# Patient Record
Sex: Female | Born: 1950 | Race: White | Hispanic: No | Marital: Married | Smoking: Former smoker
Health system: Southern US, Community
[De-identification: ages and names within clinical notes are randomized; demographics above are authoritative.]

## PROBLEM LIST (undated history)

## (undated) DIAGNOSIS — C169 Malignant neoplasm of stomach, unspecified: Secondary | ICD-10-CM

## (undated) DIAGNOSIS — G4733 Obstructive sleep apnea (adult) (pediatric): Secondary | ICD-10-CM

## (undated) DIAGNOSIS — R112 Nausea with vomiting, unspecified: Secondary | ICD-10-CM

## (undated) DIAGNOSIS — I10 Essential (primary) hypertension: Secondary | ICD-10-CM

## (undated) DIAGNOSIS — Z862 Personal history of diseases of the blood and blood-forming organs and certain disorders involving the immune mechanism: Secondary | ICD-10-CM

## (undated) DIAGNOSIS — E039 Hypothyroidism, unspecified: Secondary | ICD-10-CM

## (undated) DIAGNOSIS — E288 Other ovarian dysfunction: Secondary | ICD-10-CM

## (undated) DIAGNOSIS — Z8639 Personal history of other endocrine, nutritional and metabolic disease: Secondary | ICD-10-CM

## (undated) DIAGNOSIS — F329 Major depressive disorder, single episode, unspecified: Secondary | ICD-10-CM

## (undated) DIAGNOSIS — D219 Benign neoplasm of connective and other soft tissue, unspecified: Secondary | ICD-10-CM

## (undated) DIAGNOSIS — K219 Gastro-esophageal reflux disease without esophagitis: Secondary | ICD-10-CM

## (undated) DIAGNOSIS — Z9889 Other specified postprocedural states: Secondary | ICD-10-CM

## (undated) DIAGNOSIS — C8190 Hodgkin lymphoma, unspecified, unspecified site: Secondary | ICD-10-CM

## (undated) DIAGNOSIS — F32A Depression, unspecified: Secondary | ICD-10-CM

## (undated) HISTORY — DX: Obstructive sleep apnea (adult) (pediatric): G47.33

## (undated) HISTORY — PX: DILATION AND CURETTAGE OF UTERUS: SHX78

## (undated) HISTORY — PX: TONSILLECTOMY: SUR1361

## (undated) HISTORY — DX: Hodgkin lymphoma, unspecified, unspecified site: C81.90

## (undated) HISTORY — DX: Personal history of diseases of the blood and blood-forming organs and certain disorders involving the immune mechanism: Z86.2

## (undated) HISTORY — DX: Personal history of other endocrine, nutritional and metabolic disease: Z86.39

## (undated) HISTORY — DX: Benign neoplasm of connective and other soft tissue, unspecified: D21.9

## (undated) HISTORY — PX: GYNECOLOGIC CRYOSURGERY: SHX857

## (undated) HISTORY — DX: Other ovarian dysfunction: E28.8

## (undated) HISTORY — DX: Hypothyroidism, unspecified: E03.9

## (undated) HISTORY — PX: COLPOSCOPY: SHX161

---

## 1975-10-18 HISTORY — PX: SPLENECTOMY, TOTAL: SHX788

## 1985-07-17 DIAGNOSIS — E2839 Other primary ovarian failure: Secondary | ICD-10-CM

## 1985-07-17 HISTORY — DX: Other primary ovarian failure: E28.39

## 1998-02-25 ENCOUNTER — Other Ambulatory Visit: Admission: RE | Admit: 1998-02-25 | Discharge: 1998-02-25 | Payer: Self-pay | Admitting: Obstetrics and Gynecology

## 1999-09-01 ENCOUNTER — Other Ambulatory Visit: Admission: RE | Admit: 1999-09-01 | Discharge: 1999-09-01 | Payer: Self-pay | Admitting: Obstetrics and Gynecology

## 2000-08-09 ENCOUNTER — Ambulatory Visit (HOSPITAL_COMMUNITY): Admission: RE | Admit: 2000-08-09 | Discharge: 2000-08-09 | Payer: Self-pay | Admitting: Obstetrics and Gynecology

## 2000-11-29 ENCOUNTER — Other Ambulatory Visit: Admission: RE | Admit: 2000-11-29 | Discharge: 2000-11-29 | Payer: Self-pay | Admitting: Obstetrics and Gynecology

## 2001-12-19 ENCOUNTER — Other Ambulatory Visit: Admission: RE | Admit: 2001-12-19 | Discharge: 2001-12-19 | Payer: Self-pay | Admitting: Obstetrics and Gynecology

## 2003-01-29 ENCOUNTER — Other Ambulatory Visit: Admission: RE | Admit: 2003-01-29 | Discharge: 2003-01-29 | Payer: Self-pay | Admitting: Obstetrics and Gynecology

## 2003-07-23 ENCOUNTER — Other Ambulatory Visit: Admission: RE | Admit: 2003-07-23 | Discharge: 2003-07-23 | Payer: Self-pay | Admitting: Obstetrics and Gynecology

## 2003-07-23 ENCOUNTER — Other Ambulatory Visit: Admission: RE | Admit: 2003-07-23 | Discharge: 2003-07-23 | Payer: Self-pay | Admitting: Gynecology

## 2004-04-14 ENCOUNTER — Other Ambulatory Visit: Admission: RE | Admit: 2004-04-14 | Discharge: 2004-04-14 | Payer: Self-pay | Admitting: Obstetrics and Gynecology

## 2005-07-20 ENCOUNTER — Other Ambulatory Visit: Admission: RE | Admit: 2005-07-20 | Discharge: 2005-07-20 | Payer: Self-pay | Admitting: Obstetrics and Gynecology

## 2006-11-01 ENCOUNTER — Other Ambulatory Visit: Admission: RE | Admit: 2006-11-01 | Discharge: 2006-11-01 | Payer: Self-pay | Admitting: Obstetrics and Gynecology

## 2007-03-29 HISTORY — PX: OTHER SURGICAL HISTORY: SHX169

## 2008-01-16 ENCOUNTER — Other Ambulatory Visit: Admission: RE | Admit: 2008-01-16 | Discharge: 2008-01-16 | Payer: Self-pay | Admitting: Obstetrics and Gynecology

## 2008-10-17 HISTORY — PX: HYSTEROSCOPY: SHX211

## 2008-12-31 ENCOUNTER — Ambulatory Visit: Payer: Self-pay | Admitting: Obstetrics and Gynecology

## 2009-01-07 ENCOUNTER — Ambulatory Visit: Payer: Self-pay | Admitting: Obstetrics and Gynecology

## 2009-01-12 ENCOUNTER — Ambulatory Visit: Payer: Self-pay | Admitting: Obstetrics and Gynecology

## 2009-01-15 ENCOUNTER — Ambulatory Visit: Payer: Self-pay | Admitting: Obstetrics and Gynecology

## 2009-01-15 ENCOUNTER — Ambulatory Visit (HOSPITAL_BASED_OUTPATIENT_CLINIC_OR_DEPARTMENT_OTHER): Admission: RE | Admit: 2009-01-15 | Discharge: 2009-01-15 | Payer: Self-pay | Admitting: Obstetrics and Gynecology

## 2009-01-15 ENCOUNTER — Encounter: Payer: Self-pay | Admitting: Obstetrics and Gynecology

## 2009-01-28 ENCOUNTER — Other Ambulatory Visit: Admission: RE | Admit: 2009-01-28 | Discharge: 2009-01-28 | Payer: Self-pay | Admitting: Obstetrics and Gynecology

## 2009-01-28 ENCOUNTER — Ambulatory Visit: Payer: Self-pay | Admitting: Obstetrics and Gynecology

## 2009-01-28 ENCOUNTER — Encounter: Payer: Self-pay | Admitting: Obstetrics and Gynecology

## 2009-01-29 DIAGNOSIS — I10 Essential (primary) hypertension: Secondary | ICD-10-CM

## 2009-01-29 DIAGNOSIS — E785 Hyperlipidemia, unspecified: Secondary | ICD-10-CM

## 2009-01-30 ENCOUNTER — Ambulatory Visit: Payer: Self-pay | Admitting: Internal Medicine

## 2009-01-30 ENCOUNTER — Encounter: Payer: Self-pay | Admitting: Internal Medicine

## 2009-01-30 DIAGNOSIS — Z862 Personal history of diseases of the blood and blood-forming organs and certain disorders involving the immune mechanism: Secondary | ICD-10-CM

## 2009-01-30 DIAGNOSIS — Z8639 Personal history of other endocrine, nutritional and metabolic disease: Secondary | ICD-10-CM

## 2009-01-30 HISTORY — DX: Personal history of diseases of the blood and blood-forming organs and certain disorders involving the immune mechanism: Z86.39

## 2009-01-30 HISTORY — DX: Personal history of diseases of the blood and blood-forming organs and certain disorders involving the immune mechanism: Z86.2

## 2009-02-11 ENCOUNTER — Ambulatory Visit: Payer: Self-pay

## 2009-02-11 ENCOUNTER — Encounter: Payer: Self-pay | Admitting: Internal Medicine

## 2009-02-11 ENCOUNTER — Ambulatory Visit: Payer: Self-pay | Admitting: Internal Medicine

## 2009-02-18 LAB — CONVERTED CEMR LAB
ALT: 35 units/L (ref 0–35)
AST: 35 units/L (ref 0–37)
Albumin: 3.8 g/dL (ref 3.5–5.2)
BUN: 11 mg/dL (ref 6–23)
Calcium: 9 mg/dL (ref 8.4–10.5)
Chloride: 109 meq/L (ref 96–112)
Cholesterol: 264 mg/dL — ABNORMAL HIGH (ref 0–200)
Potassium: 4.3 meq/L (ref 3.5–5.1)
Sodium: 142 meq/L (ref 135–145)
TSH: 2.59 microintl units/mL (ref 0.35–5.50)
Total Bilirubin: 0.8 mg/dL (ref 0.3–1.2)
Total CHOL/HDL Ratio: 3
Total Protein: 7 g/dL (ref 6.0–8.3)
VLDL: 50.6 mg/dL — ABNORMAL HIGH (ref 0.0–40.0)

## 2009-02-24 ENCOUNTER — Ambulatory Visit (HOSPITAL_BASED_OUTPATIENT_CLINIC_OR_DEPARTMENT_OTHER): Admission: RE | Admit: 2009-02-24 | Discharge: 2009-02-24 | Payer: Self-pay | Admitting: Internal Medicine

## 2009-02-24 ENCOUNTER — Encounter: Payer: Self-pay | Admitting: Internal Medicine

## 2009-02-24 ENCOUNTER — Encounter: Payer: Self-pay | Admitting: Pulmonary Disease

## 2009-03-04 ENCOUNTER — Telehealth: Payer: Self-pay | Admitting: Internal Medicine

## 2009-03-09 ENCOUNTER — Ambulatory Visit: Payer: Self-pay | Admitting: Pulmonary Disease

## 2009-03-13 ENCOUNTER — Ambulatory Visit: Payer: Self-pay | Admitting: Internal Medicine

## 2009-04-30 ENCOUNTER — Telehealth: Payer: Self-pay | Admitting: Internal Medicine

## 2009-05-20 ENCOUNTER — Ambulatory Visit: Payer: Self-pay | Admitting: Pulmonary Disease

## 2009-05-20 DIAGNOSIS — G4733 Obstructive sleep apnea (adult) (pediatric): Secondary | ICD-10-CM

## 2009-05-21 DIAGNOSIS — G4733 Obstructive sleep apnea (adult) (pediatric): Secondary | ICD-10-CM

## 2009-05-21 HISTORY — DX: Obstructive sleep apnea (adult) (pediatric): G47.33

## 2009-06-03 ENCOUNTER — Encounter (INDEPENDENT_AMBULATORY_CARE_PROVIDER_SITE_OTHER): Payer: Self-pay | Admitting: *Deleted

## 2009-06-17 ENCOUNTER — Ambulatory Visit: Payer: Self-pay | Admitting: Pulmonary Disease

## 2009-11-27 ENCOUNTER — Ambulatory Visit: Payer: Self-pay | Admitting: Internal Medicine

## 2009-12-01 ENCOUNTER — Telehealth (INDEPENDENT_AMBULATORY_CARE_PROVIDER_SITE_OTHER): Payer: Self-pay | Admitting: *Deleted

## 2009-12-02 ENCOUNTER — Ambulatory Visit: Payer: Self-pay | Admitting: Cardiology

## 2009-12-02 ENCOUNTER — Ambulatory Visit: Payer: Self-pay

## 2009-12-02 ENCOUNTER — Encounter (HOSPITAL_COMMUNITY): Admission: RE | Admit: 2009-12-02 | Discharge: 2010-02-16 | Payer: Self-pay | Admitting: Internal Medicine

## 2009-12-09 ENCOUNTER — Encounter (INDEPENDENT_AMBULATORY_CARE_PROVIDER_SITE_OTHER): Payer: Self-pay | Admitting: *Deleted

## 2009-12-09 ENCOUNTER — Encounter: Payer: Self-pay | Admitting: Internal Medicine

## 2009-12-09 LAB — CONVERTED CEMR LAB
Albumin: 4.3 g/dL (ref 3.5–5.2)
GGT: 177 units/L — ABNORMAL HIGH (ref 7–51)
Total Bilirubin: 0.3 mg/dL (ref 0.3–1.2)
Total Protein: 6.9 g/dL (ref 6.0–8.3)

## 2009-12-16 ENCOUNTER — Ambulatory Visit: Payer: Self-pay | Admitting: Pulmonary Disease

## 2010-01-20 ENCOUNTER — Ambulatory Visit: Payer: Self-pay | Admitting: Gastroenterology

## 2010-01-20 DIAGNOSIS — F329 Major depressive disorder, single episode, unspecified: Secondary | ICD-10-CM

## 2010-01-27 ENCOUNTER — Ambulatory Visit (HOSPITAL_COMMUNITY): Admission: RE | Admit: 2010-01-27 | Discharge: 2010-01-27 | Payer: Self-pay | Admitting: Gastroenterology

## 2010-11-10 ENCOUNTER — Ambulatory Visit
Admission: RE | Admit: 2010-11-10 | Discharge: 2010-11-10 | Payer: Self-pay | Source: Home / Self Care | Attending: Obstetrics and Gynecology | Admitting: Obstetrics and Gynecology

## 2010-11-10 ENCOUNTER — Other Ambulatory Visit
Admission: RE | Admit: 2010-11-10 | Discharge: 2010-11-10 | Payer: Self-pay | Source: Home / Self Care | Admitting: Obstetrics and Gynecology

## 2010-11-10 ENCOUNTER — Other Ambulatory Visit: Payer: Self-pay | Admitting: Obstetrics and Gynecology

## 2010-11-16 NOTE — Miscellaneous (Signed)
  Clinical Lists Changes  Orders: Added new Referral order of Gastroenterology Referral (GI) - Signed  

## 2010-11-16 NOTE — Assessment & Plan Note (Addendum)
Summary: Cardiology Nuclear Study  Nuclear Med Background Indications for Stress Test: Evaluation for Ischemia   History: Echo  History Comments: 4/10 Echo: EF=55-60%, mild MS, h/o OSA  Symptoms: DOE, Fatigue with Exertion, Palpitations, Rapid HR    Nuclear Pre-Procedure Cardiac Risk Factors: History of Smoking, Hypertension, Lipids Caffeine/Decaff Intake: None NPO After: 8:30 PM Lungs: Clear IV 0.9% NS with Angio Cath: 20g     IV Site: (R) AC IV Started by: Stanton Kidney EMT-P Chest Size (in) 38     Cup Size C     Height (in): 66 Weight (lb): 204 BMI: 33.05  Nuclear Med Study 1 or 2 day study:  1 day     Stress Test Type:  Stress Reading MD:  Marca Ancona, MD     Referring MD:  Dietrich Pates, MD Resting Radionuclide:  Technetium 27m Tetrofosmin     Resting Radionuclide Dose:  11.0 mCi  Stress Radionuclide:  Technetium 37m Tetrofosmin     Stress Radionuclide Dose:  33.0 mCi   Stress Protocol Exercise Time (min):  7:31 min     Max HR:  150 bpm     Predicted Max HR:  163 bpm  Max Systolic BP: 190 mm Hg     Percent Max HR:  92.02 %     METS: 9.5 Rate Pressure Product:  45409    Stress Test Technologist:  Rea College CMA-N     Nuclear Technologist:  Burna Mortimer Deal RT-N  Rest Procedure  Myocardial perfusion imaging was performed at rest 45 minutes following the intravenous administration of Myoview Technetium 79m Tetrofosmin.  Stress Procedure  The patient exercised for 7:31.  The patient stopped due to fatigue and denied any chest pain.  There were no significant ST-T wave changes.  Myoview was injected at peak exercise and myocardial perfusion imaging was performed after a brief delay.  QPS Raw Data Images:  Normal; no motion artifact; normal heart/lung ratio. Stress Images:  NI: Uniform and normal uptake of tracer in all myocardial segments. Rest Images:  Normal homogeneous uptake in all areas of the myocardium. Subtraction (SDS):  There is no evidence of scar or  ischemia. Transient Ischemic Dilatation:  .98  (Normal <1.22)  Lung/Heart Ratio:  .28  (Normal <0.45)  Quantitative Gated Spect Images QGS EDV:  71 ml QGS ESV:  20 ml QGS EF:  72 % QGS cine images:  Normal wall motion.    Overall Impression  Exercise Capacity: Fair exercise capacity. BP Response: Normal blood pressure response. Clinical Symptoms: Fatigue, short of breath.  ECG Impression: No significant ST segment change suggestive of ischemia. Overall Impression: Normal stress nuclear study.  Appended Document: Cardiology Nuclear Study Normal stress test.  Appended Document: Cardiology Nuclear Study OK to exercise.  Appended Document: Cardiology Nuclear Study Patient aware of normal stress test results.  Appended Document:  Cardiology     Nuclear Study  Procedure date:  12/02/2010  Findings:       Overall Impression   Exercise Capacity: Fair exercise capacity. BP Response: Normal blood pressure response. Clinical Symptoms: Fatigue, short of breath.  ECG Impression: No significant ST segment change suggestive of ischemia. Overall Impression: Normal stress nuclear study.    Signed by Marca Ancona, MD on 12/02/2009 at 10:20 PM  Signed by Marca Ancona, MD on 12/02/2009 at 10:21 PM ________________________________________________________________________ Normal stress test.   Signed by Sherrill Raring, MD, [facility] on 12/06/2009 at 8:01 AM  ________________________________________________________________________ OK to exercise.

## 2010-11-16 NOTE — Letter (Signed)
Summary: New Patient letter  [facility]  [address] [place], [place] [address]   Phone: [phone]  Fax: [phone]       12/09/2009 MRN: [id number]  [facility] [place] [address] [place], [place]  [address]  Dear Sabrina Melton,  Welcome to the Gastroenterology Division at [facility].    You are scheduled to see Dr. Arlyce Dice on 01-06-10 at 11:00a.m. on the 3rd floor at [facility], [address]  We ask that you try to arrive at our office 15 minutes prior to your appointment time to allow for check-in.  We would like you to complete the enclosed self-administered evaluation form prior to your visit and bring it with you on the day of your appointment.  We will review it with you.  Also, please bring a complete list of all your medications or, if you prefer, bring the medication bottles and we will list them.  Please bring your insurance card so that we may make a copy of it.  If your insurance requires a referral to see a specialist, please bring your referral form from your primary care physician.  Co-payments are due at the time of your visit and may be paid by cash, check or credit card.     Your office visit will consist of a consult with your physician (includes a physical exam), any laboratory testing he/she may order, scheduling of any necessary diagnostic testing (e.g. x-ray, ultrasound, CT-scan), and scheduling of a procedure (e.g. Endoscopy, Colonoscopy) if required.  Please allow enough time on your schedule to allow for any/all of these possibilities.    If you cannot keep your appointment, please call [phone] to cancel or reschedule prior to your appointment date.  This allows [place] the opportunity to schedule an appointment for another patient in need of care.  If you do not cancel or reschedule by 5 p.m. the business day prior to your appointment date, you will be charged a $50.00 late cancellation/no-show fee.    Thank you for  choosing [facility] for your medical needs.  We appreciate the opportunity to care for you.  Please visit [place] at our website  to learn more about our practice.                     Sincerely,                                                             The Gastroenterology Division

## 2010-11-16 NOTE — Assessment & Plan Note (Signed)
Summary: ELEVATED LFT'S/YF   History of Present Illness Visit Type: consult  Primary GI MD: Melvia Heaps MD [facility] Primary Provider: n/a Requesting Provider: Dietrich Pates, MD Chief Complaint: Acid reflux, heartburn, belching, and weight gain  History of Present Illness:   Sabrina Melton is a pleasant 60 year old white female referred at the request of Dr. Tenny Craw for evaluation of abnormal liver tests.  This was noted on routine testing.  On November 27, 2009 AST was 47 and ALT 57.  Other liver tests were normal.  There is no history of hepatitis, alcohol or drug abuse.  She is without pain or pruritus.  Patient has a history of Hodgkin's lymphoma diagnosed in 1977 and treated by radiation therapy.   GI Review of Systems    Reports acid reflux, belching, and  heartburn.      Denies abdominal pain, bloating, chest pain, dysphagia with liquids, dysphagia with solids, loss of appetite, nausea, vomiting, vomiting blood, weight loss, and  weight gain.        Denies anal fissure, black tarry stools, change in bowel habit, constipation, diarrhea, diverticulosis, fecal incontinence, heme positive stool, hemorrhoids, irritable bowel syndrome, jaundice, light color stool, liver problems, rectal bleeding, and  rectal pain.    Current Medications (verified): 1)  Prilosec Otc 20 Mg Tbec (Omeprazole Magnesium) .... Take 1 Once Daily As Needed 2)  Lexapro 10 Mg Tabs (Escitalopram Oxalate) .... Take 1 By Mouth Once Daily 3)  Synthroid 50 Mcg Tabs (Levothyroxine Sodium) .... Take 1 Once Daily 4)  Atenolol 25 Mg Tabs (Atenolol) .... Take One Tablet By Mouth Daily 5)  Cpap .... At Bedtime  Allergies (verified): No Known Drug Allergies  Past History:  Past Medical History: -HODGKIN'S LYMPHOMA (ICD-202.80)--rxed with xrt OTHER AND UNSPECIFIED HYPERLIPIDEMIA (ICD-272.4) HYPERTENSION, UNSPECIFIED (ICD-401.9) UNSPECIFIED HYPOTHYROIDISM (ICD-244.9) Dyspnea  Past Surgical History: Reviewed history from  05/20/2009 and no changes required. broken tibia, fibula, ankle.  Repaired at [facility] s/p spleenectomy  Family History: maternal grandmother breast cancer No FH of Colon Cancer:  Social History: Sales executive Married Tobacco Use -Past Drug Use - no 2 glass wine per night. Daily Caffeine Use: 2 daily   Review of Systems       The patient complains of fatigue.  The patient denies allergy/sinus, anemia, anxiety-new, arthritis/joint pain, back pain, blood in urine, breast changes/lumps, change in vision, confusion, cough, coughing up blood, depression-new, fainting, fever, headaches-new, hearing problems, heart murmur, heart rhythm changes, itching, menstrual pain, muscle pains/cramps, night sweats, nosebleeds, pregnancy symptoms, shortness of breath, skin rash, sleeping problems, sore throat, swelling of feet/legs, swollen lymph glands, thirst - excessive , urination - excessive , urination changes/pain, urine leakage, vision changes, and voice change.         All other systems were reviewed and were negative   Vital Signs:  Patient profile:   60 year old female Height:      66 inches Weight:      207 pounds BMI:     33.53 BSA:     2.03 Pulse rate:   88 / minute Pulse rhythm:   regular BP sitting:   132 / 76  (left arm) Cuff size:   regular  Vitals Entered By: Ok Anis CMA (January 20, 2010 1:58 PM)  Physical Exam  Additional Exam:  On physical exam she is a heavyset female  skin: anicteric HEENT: normocephalic; PEERLA; no nasal or pharyngeal abnormalities neck: supple nodes: no cervical lymphadenopathy chest: clear to ausculatation and percussion heart: no murmurs,  gallops, or rubs abd: soft, nontender; BS normoactive; no abdominal masses, tenderness, organomegaly rectal: deferred ext: no cynanosis, clubbing, edema skeletal: no deformities neuro: oriented x 3; no focal abnormalities    Impression & Recommendations:  Problem # 1:  LIVER FUNCTION TESTS,  ABNORMAL, HX OF (ICD-V12.2) LFT abnormalities are most likely related to hepatic steatosis.  Recommendations #1 abdominal ultrasound  Problem # 2:  SPECIAL SCREENING FOR MALIGNANT NEOPLASMS COLON (ICD-V76.51) Patient will be scheduled for screening colonoscopy  Risks, alternatives, and complications of the procedure, including bleeding, perforation, and possible need for surgery, were explained to the patient.  Patient's questions were answered.  Other Orders: Ultrasound Abdomen (UAS)  Patient Instructions: 1)  Colonoscopy and Flexible Sigmoidoscopy brochure given.  2)  Conscious Sedation brochure given.  3)  Pt. will call and schedule Colonoscopy.   4)  Abdominal Ultrasound [facility] 01/27/10 at 9:00 am arrive at 8:45 am. 5)  Nothing to eat or drink after midnight. 6)  Copy sent to : Dietrich Pates, MD 7)  The medication list was reviewed and reconciled.  All changed / newly prescribed medications were explained.  A complete medication list was provided to the patient / caregiver.

## 2010-11-16 NOTE — Letter (Signed)
Summary: Results Letter  [facility]  [address] [place], [place] [address]   Phone: [phone]  Fax: [phone]        January 20, 2010 MRN: [id number]    [facility] [place] [address] [place], [place]  [address]    Dear Ms. Mudgett,  It is my pleasure to have treated you recently as a new patient in my office. I appreciate your confidence and the opportunity to participate in your care.  Since I do have a busy inpatient endoscopy schedule and office schedule, my office hours vary weekly. I am, however, available for emergency calls everyday through my office. If I am not available for an urgent office appointment, another one of our gastroenterologist will be able to assist you.  My well-trained staff are prepared to help you at all times. For emergencies after office hours, a physician from our Gastroenterology section is always available through my 24 hour answering service  Once again I welcome you as a new patient and I look forward to a happy and healthy relationship             Sincerely,  Louis Meckel MD  This letter has been electronically signed by your physician.  Appended Document: Results Letter mailed

## 2010-11-16 NOTE — Assessment & Plan Note (Signed)
Summary: rov/jss   Visit Type:  Follow-up Referring Provider:  Tenny Craw Primary Provider:  Dr. Eda Paschal  CC:  no cardiac complaints.  History of Present Illness: patient is a 60 year old with a history of dyspnea and palpitations.  I last saw her in May of 2010.  Prior to that I had started her on atenolol for possible diastolic dysfunction and shortness of breath.  In may, she said she was doing much better. On talking to her, she denies chest pains/pressure.  She does say she is better than in the past.  But she still gives out with activity.  She is short of breath with stairs. her father recently died.  She has not been dietting or been as careful with what she eats.  Current Medications (verified): 1)  Prilosec Otc 20 Mg Tbec (Omeprazole Magnesium) .... Take 1 Once Daily As Needed 2)  Lexapro 10 Mg Tabs (Escitalopram Oxalate) .... Take 1 By Mouth Once Daily 3)  Synthroid 50 Mcg Tabs (Levothyroxine Sodium) .... Take 1 Once Daily 4)  Atenolol 25 Mg Tabs (Atenolol) .... Take One Tablet By Mouth Daily  Allergies: No Known Drug Allergies  Past History:  Social History: Last updated: 01/30/2009 Tobacco Use - No.  Drug Use - no 1 glass wine per night.  Past Medical History:  -HODGKIN'S LYMPHOMA (ICD-202.80)--rxed with xrt OTHER AND UNSPECIFIED HYPERLIPIDEMIA (ICD-272.4) HYPERTENSION, UNSPECIFIED (ICD-401.9) UNSPECIFIED HYPOTHYROIDISM (ICD-244.9) Dyspnea  Review of Systems       All systems reviewed.  neg to above problem except as noted above.  Vital Signs:  Patient profile:   60 year old female Height:      86 inches Weight:      211 pounds BMI:     20.13 Pulse rate:   90 / minute Resp:     14 per minute BP sitting:   140 / 80  (left arm)  Vitals Entered By: Burnett Kanaris, CNA (November 27, 2009 2:50 PM)  Physical Exam  Additional Exam:  HEENT:  Normocephalic, atraumatic. EOMI, PERRLA.  Neck: JVP is normal. No thyromegaly. No bruits.  Lungs: clear to  auscultation. No rales no wheezes.  Heart: Regular rate and rhythm. Normal S1, S2. No S3.   No significant murmurs. PMI not displaced.  Abdomen:  Supple, nontender. Normal bowel sounds. No masses. No hepatomegaly.  Extremities:   Good distal pulses throughout. No lower extremity edema.  Musculoskeletal :moving all extremities.  Neuro:   alert and oriented x3.    EKG  Procedure date:  11/27/2009  Findings:      NSR.  95 bpm.    Impression & Recommendations:  Problem # 1:  DYSPNEA (ICD-786.05) this may represent some deconditioning in setting of being overweight.  May represent some diastolic dysfunction.  My concern though his her history of lymphoma and previous mantle irradiation with this shortness of breath.  i would recommend stress myoview to evaluate. Her updated medication list for this problem includes:    Atenolol 25 Mg Tabs (Atenolol) .Marland Kitchen... Take one tablet by mouth daily  Problem # 2:  LIVER FUNCTION TESTS, ABNORMAL, HX OF (ICD-V12.2) Will repeat.  In past GGT was elevated but AST, ALT were normal. Orders: T-Hepatic Function [phone]) T-Gamma GT (GGT) ([id number])  Problem # 3:  HYPERTENSION, UNSPECIFIED (ICD-401.9) Fair control Her updated medication list for this problem includes:    Atenolol 25 Mg Tabs (Atenolol) .Marland Kitchen... Take one tablet by mouth daily  Orders: EKG w/ Interpretation ([address])  Problem # 4:  HYPERLIPIDEMIA-MIXED (ICD-272.4) Will need to work on diet and activity.  I would check in summer.  Other Orders: Nuclear Stress Test (Nuc Stress Test) Prescriptions: ATENOLOL 25 MG TABS (ATENOLOL) Take one tablet by mouth daily  #100 x 3   Entered by:   Layne Benton, RN, BSN   Authorized by:   Sherrill Raring, MD, [facility]   Signed by:   Layne Benton, RN, BSN on 11/27/2009   Method used:   Electronically to        [facility] (mail-order)             ,          Ph: [phone]       Fax: [phone]   RxID:   [id number]

## 2010-11-16 NOTE — Progress Notes (Signed)
Summary: Nuclear Pre-Procedure  Phone Note Outgoing Call Call back at [facility] Phone [phone]   Call placed by: Stanton Kidney, EMT-P,  December 01, 2009 1:28 PM Action Taken: Phone Call Completed Summary of Call: Left message with information on Myoview Information Sheet (see scanned document for details).     Nuclear Med Background Indications for Stress Test: Evaluation for Ischemia   History: Echo  History Comments: 4/10 Echo: EF=55-60%, mild mitral stenosis  Symptoms: DOE, Fatigue with Exertion, Palpitations    Nuclear Pre-Procedure Cardiac Risk Factors: Hypertension, Lipids Height (in): 86

## 2010-11-16 NOTE — Assessment & Plan Note (Signed)
Summary: rov for osa   Copy to:  [facility] Primary Provider/Referring Provider:  Dr. Eda Paschal  CC:  Pt is here for a 6 month f/u appt.   Pt states she is wearing her cpap machine every night.  Approx 8 hours per night.  Pt denied any complaints with mask or pressure. Marland Kitchen  History of Present Illness: the pt comes in today for f/u of her known osa.  She is wearing cpap compliantly, and is having no issues with mask fit or pressure.  She is sleeping better, and does feel more rested during the day.  Her weight is up about 5 pounds from last visit.  Medications Prior to Update: 1)  Prilosec Otc 20 Mg Tbec (Omeprazole Magnesium) .... Take 1 Once Daily As Needed 2)  Lexapro 10 Mg Tabs (Escitalopram Oxalate) .... Take 1 By Mouth Once Daily 3)  Synthroid 50 Mcg Tabs (Levothyroxine Sodium) .... Take 1 Once Daily 4)  Atenolol 25 Mg Tabs (Atenolol) .... Take One Tablet By Mouth Daily  Allergies (verified): No Known Drug Allergies  Vital Signs:  Patient profile:   60 year old female Height:      66 inches Weight:      211.50 pounds BMI:     34.26 O2 Sat:      94 % on Room air Temp:     98.8 degrees F oral Pulse rate:   99 / minute BP sitting:   128 / 80  (left arm) Cuff size:   regular  Vitals Entered By: Arman Filter LPN (December 16, 1608 1:40 PM)  O2 Flow:  Room air CC: Pt is here for a 6 month f/u appt.   Pt states she is wearing her cpap machine every night.  Approx 8 hours per night.  Pt denied any complaints with mask or pressure.  Comments Medications reviewed with patient  Arman Filter LPN  December 17, 9602 1:40 PM   Physical Exam  General:  ow female in nad Nose:  no skin breakdown or pressure necrosis from cpap mask Neurologic:  alert, not sleepy, moves all 4.   Impression & Recommendations:  Problem # 1:  OBSTRUCTIVE SLEEP APNEA (ICD-327.23)  the pt is wearing cpap compliantly with no major issues.  She feels that she sleeps better, and has seen an improvement in her  daytime symptoms.  I have asked her to stay on cpap, and to work aggressively on weight loss.  She will f/u with me in 12mos, or sooner if having issues.  Other Orders: Est. Patient Level II (54098)  Patient Instructions: 1)  Please schedule a follow-up appointment in 1 year. 2)  work on weight loss.    Immunization History:  Influenza Immunization History:    Influenza:  historical (07/17/2009)

## 2010-12-10 ENCOUNTER — Ambulatory Visit: Payer: Self-pay | Admitting: Internal Medicine

## 2010-12-10 ENCOUNTER — Encounter: Payer: Self-pay | Admitting: Internal Medicine

## 2010-12-10 ENCOUNTER — Other Ambulatory Visit: Payer: Self-pay | Admitting: Internal Medicine

## 2010-12-10 ENCOUNTER — Ambulatory Visit (INDEPENDENT_AMBULATORY_CARE_PROVIDER_SITE_OTHER): Payer: BC Managed Care – PPO | Admitting: Internal Medicine

## 2010-12-10 DIAGNOSIS — R0609 Other forms of dyspnea: Secondary | ICD-10-CM

## 2010-12-10 DIAGNOSIS — I10 Essential (primary) hypertension: Secondary | ICD-10-CM

## 2010-12-10 DIAGNOSIS — E785 Hyperlipidemia, unspecified: Secondary | ICD-10-CM

## 2010-12-10 DIAGNOSIS — R000 Tachycardia, unspecified: Secondary | ICD-10-CM

## 2010-12-10 DIAGNOSIS — R0989 Other specified symptoms and signs involving the circulatory and respiratory systems: Secondary | ICD-10-CM

## 2010-12-10 LAB — LIPID PANEL
Cholesterol: 236 mg/dL — ABNORMAL HIGH (ref 0–200)
HDL: 94.3 mg/dL (ref >39.00)

## 2010-12-10 LAB — LDL CHOLESTEROL, DIRECT: Direct LDL: 95.7 mg/dL

## 2010-12-15 ENCOUNTER — Ambulatory Visit: Payer: Self-pay | Admitting: Pulmonary Disease

## 2010-12-16 ENCOUNTER — Telehealth: Payer: Self-pay | Admitting: Pulmonary Disease

## 2010-12-23 NOTE — Progress Notes (Signed)
Summary: nos appt  Phone Note Call from Patient   Caller: [email or web address]  Call For: clance Summary of Call: LMTCB x2 to rsc nos from 2/29. Initial call taken by: Darletta Moll,  December 16, 2010 2:59 PM

## 2010-12-23 NOTE — Assessment & Plan Note (Signed)
Summary: rov. gd   Visit Type:  rov Referring Provider:  Dietrich Pates, MD  Primary Provider:  n/a  CC:  no complaints.  History of Present Illness: patient is a 60 year old with a history of dyspnea and palpitations.  I last saw her in Feb 2011.  At that time wiht her complaints of dyspnea and with a histor of mantle irradiation I scheduled her for a myoview.  This showed normal perfusion.   since seen she has done well.  NO chest pains.  Breathing is better.  Is using CPAP. Not walkking regularly  Current Medications (verified): 1)  Prilosec Otc 20 Mg Tbec (Omeprazole Magnesium) .... Take 1 Once Daily As Needed 2)  Lexapro 10 Mg Tabs (Escitalopram Oxalate) .... Take 1/2 By Mouth Once Daily 3)  Synthroid 50 Mcg Tabs (Levothyroxine Sodium) .... Take 1 Once Daily 4)  Atenolol 25 Mg Tabs (Atenolol) .... Take One Tablet By Mouth Daily 5)  Cpap .... At Bedtime  Allergies (verified): No Known Drug Allergies  Past History:  Past medical, surgical, family and social histories (including risk factors) reviewed, and no changes noted (except as noted below).  Past Medical History: Reviewed history from 01/20/2010 and no changes required. -HODGKIN'S LYMPHOMA (ICD-202.80)--rxed with xrt OTHER AND UNSPECIFIED HYPERLIPIDEMIA (ICD-272.4) HYPERTENSION, UNSPECIFIED (ICD-401.9) UNSPECIFIED HYPOTHYROIDISM (ICD-244.9) Dyspnea  Past Surgical History: Reviewed history from 05/20/2009 and no changes required. broken tibia, fibula, ankle.  Repaired at [facility] s/p spleenectomy  Family History: Reviewed history from 01/20/2010 and no changes required. 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 Daily Caffeine Use: 2 daily   Review of Systems       All systems reviewed.  Neg to the above problme except as noted  above.  Vital Signs:  Patient profile:   60 year old female Height:      66 inches Weight:      200 pounds BMI:     32.40 Pulse rate:   80 / minute BP sitting:   136 / 76  (left arm) Cuff size:   large  Vitals Entered By: Caralee Ates CMA (December 10, 2010 10:24 AM)  Physical Exam  Additional Exam:  Patient is in NAD HEENT:  Normocephalic, atraumatic. EOMI, PERRLA.  Neck: JVP is normal. No thyromegaly. No bruits.  Lungs: clear to auscultation. No rales no wheezes.  Heart: Regular rate and rhythm. Normal S1, S2. No S3.   No significant murmurs. PMI not displaced.  Abdomen:  Supple, nontender. Normal bowel sounds. No masses. No hepatomegaly.  Extremities:   Good distal pulses throughout. No lower extremity edema.  Musculoskeletal :moving all extremities.  Neuro:   alert and oriented x3.    EKG  Procedure date:  12/10/2010  Findings:      NSR>  80 bpm.  Impression & Recommendations:  Problem # 1:  DYSPNEA (ICD-786.05)  Improved.  No change in regimen. Encouraged her to increase activity. Her updated medication list for this problem includes:    Atenolol 25 Mg Tabs (Atenolol) .Marland Kitchen... Take one tablet by mouth daily  Her updated medication list for this problem includes:    Atenolol 25 Mg Tabs (Atenolol) .Marland Kitchen... Take one tablet by mouth daily  Problem # 2:  HYPERLIPIDEMIA-MIXED (ICD-272.4) Lipids today. Orders: TLB-Lipid Panel (80061-LIPID)  Problem # 3:  SLEEP APNEA (ICD-780.57) Using CPAP.  Other  Orders: EKG w/ Interpretation (93000)  Patient Instructions: 1)  Your physician recommends that you return for a FASTING lipid profile: lipid panel today   we will call you with results. 2)  Your physician wants you to follow-up in:12 months with Dr.Tagg Eustice   You will receive a reminder letter in the mail two months in advance. If you don't receive a letter, please call our office to schedule the follow-up appointment. Prescriptions: ATENOLOL 25 MG TABS (ATENOLOL) Take one  tablet by mouth daily  #100 x 3   Entered by:   Layne Benton, RN, BSN   Authorized by:   Sherrill Raring, MD, [facility]   Signed by:   Layne Benton, RN, BSN on 12/10/2010   Method used:   Faxed to ...       [facility] (mail-order)       [id number]       [place], [place]  [id number]       Ph:        Fax: [phone]   RxID:   [id number]

## 2011-03-01 NOTE — Op Note (Signed)
Sabrina Melton, Sabrina Melton           ACCOUNT NO.:  [id number]   MEDICAL RECORD NO.:  [id number]          PATIENT TYPE:  AMB   LOCATION:  [facility]                         FACILITY:  [facility]   PHYSICIAN:  Daniel L. Gottsegen, M.D.DATE OF BIRTH:  Mar 28, 1951   DATE OF PROCEDURE:  01/15/2009  DATE OF DISCHARGE:                               OPERATIVE REPORT   PREOPERATIVE DIAGNOSES:  Postmenopausal bleeding and intrauterine cavity  defects.   POSTOPERATIVE DIAGNOSES:  1. Postmenopausal bleeding.  2. Endometrial polyps.  3. 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   OPERATIONS:  1. Hysteroscopy.  2. D and C.  3. Excision of all intrauterine cavity lesions.   SURGEON:  Daniel L. Eda Paschal, M.D.   ANESTHESIA:  General.   INDICATIONS:  The patient is a 60 year old gravida 2, para 1, AB 1 who  had presented to the office with a 4-81-month history of postmenopausal  spotting.  A saline infusion histogram was done in the office; she had a  very enlarged endometrial cavity with 3 separate, distinct endometrial  cavity lesions.  They appeared to be mostly consistent with endometrial  polyps.  She now comes to the operating room for hysteroscopy and  excision of endometrial cavity lesions.   FINDINGS:  External exam is normal.  BUS is normal.  Vaginal is normal.  Cervix is clean, but nulliparous.  It is close to being flush with the  top of the vagina, making it difficult to both grasp and to dilate.  The  uterus itself was top normal size and shape.  Adnexa were not palpable  at the time of hysteroscopy.  The patient had 3 distinct lesions in the  intrauterine cavity, 2 appeared to be most consistent with polyps.  One  appeared to be more consistent with a submucous myoma.  In addition to  this, there just appeared to be a polypoid-like appearance of the  endometrium, not consistent with her postmenopausal state.   PROCEDURE:  After adequate general anesthesia, the patient was placed in  the dorsal lithotomy  position, prepped and draped in the usual sterile  manner.  A single-tooth tenaculum was placed on  the anterior lip of the  cervix.  This was, in and of itself, difficult -- as her cervix was  flush with the vagina from atrophic changes and she had a small  nulliparous cervix.  With great care, the cervix was able to be dilated  to a 33-Pratt dilator.  The hysteroscopic resectoscope could be  introduced; however, it was difficult to move it around because of the  patient's anatomy.  Some resection of polypoid tissue was done, but at  one point we elected to switch back to a small resectoscope.  This  actually did not work as well because of the shape of the resectoscope.  It was more difficult to negotiate the cervical canal, so we then went  back to the hysteroscopic resectoscope.  As the procedure went on, it  became easier to manipulate it within the intrauterine cavity.  A wire  loop was used attached to Bovie settings.  Polypectomies were done.  The  third lesion, which looked  more like a submucous myoma, was also excised  in pieces.  At one point the Bovie unit stopped functioning, and the  cord had to be replaced; after that, it functioned well.  When we were  done, there was some ragged edges to the myometrium were we had done the  resection.  There were also several areas that looked polypoid, but were  flushed with the cavity and did not have an appearance of a distinct  lesion.  It made the surgeon wonder if she did not have just a polypoid  appearance of some endometrial tissue from unopposed estrogen.  There  was some brisk bleeding at one point during the resection of the  submucous myoma, but this was quickly controlled with Bovie current.  At  the termination of procedure there was absolutely no bleeding noted, and  the fluid deficit was 230 mL.   The patient tolerated the procedure well and left the operating room in  satisfactory condition.      Daniel L.  Eda Paschal, M.D.  Electronically Signed     DLG/MEDQ  D:  01/15/2009  T:  01/15/2009  Job:  [id number]

## 2011-03-01 NOTE — Procedures (Signed)
Sabrina Melton, Sabrina Melton           ACCOUNT NO.:  [id number]   MEDICAL RECORD NO.:  [id number]          PATIENT TYPE:  OUT   LOCATION:  [facility]                 FACILITY:  [facility]   PHYSICIAN:  Barbaraann Share, MD,FCCPDATE OF BIRTH:  07-May-1951   DATE OF STUDY:  02/24/2009                            NOCTURNAL POLYSOMNOGRAM   REFERRING PHYSICIAN:   INDICATION FOR STUDY:  Hypersomnia with sleep apnea.   EPWORTH SLEEPINESS SCORE:  10.   MEDICATIONS:   SLEEP ARCHITECTURE:  The patient had a total sleep time of 299 minutes  with very little slow wave sleep and only 57 minutes of REM.  Sleep  onset latency was prolonged at 39 minutes, and the patient did not  achieve REM during the diagnostic portion of the study.  Sleep  efficiency was 63% during the diagnostic portion, and 76% during the  titration portion.   RESPIRATORY DATA:  The patient underwent a split-night protocol where  she was found to have 130 apneas and hypopneas in the first 139 minutes  of sleep.  This gave her an apnea-hypopnea index during the diagnostic  portion of 56 events per hour.  The events occurred primarily in the  supine position, and there was loud snoring noted throughout.  By  protocol, the patient was then fitted with a medium ResMed Quattro full  face mask, and ultimately titrated to a final pressure of 16-17 cm in  order to treat both obstructive events and snoring.  It should be noted  the patient did not achieve REM on the final CPAP settings.   OXYGEN DATA:  There was O2 desaturation as low as 86% with her  obstructive events.   CARDIAC DATA:  No clinically significant arrhythmias were seen.   MOVEMENT-PARASOMNIA:  The patient did not have any periodic leg  movements or abnormal behavior seen.   IMPRESSIONS-RECOMMENDATIONS:  Split-night study reveals severe  obstructive sleep apnea with an apnea-hypopnea index of 56 events per  hour and O2 desaturation as low as 86% during the diagnostic  portion of  the study.  The patient was then placed on continuous positive airway  pressure with a medium Quattro full face mask, and titrated to a final  pressure of 16-17 cm of water in order to control both obstructive  events and snoring.  It should be  noted the patient did not achieve REM on the final continuous positive  airway pressure settings, and therefore her pressure needs may be  slightly higher.  The patient should also be encouraged to work  aggressively on weight loss.      Barbaraann Share, MD,FCCP  Diplomate, American Board of Sleep  Medicine  Electronically Signed     KMC/MEDQ  D:  03/09/2009 19:31:25  T:  03/10/2009 08:31:41  Job:  [id number]

## 2011-09-02 ENCOUNTER — Other Ambulatory Visit: Payer: Self-pay | Admitting: *Deleted

## 2011-09-02 MED ORDER — ATENOLOL 25 MG PO TABS: 25.0000 mg | ORAL_TABLET | Freq: Every day | ORAL | Status: DC

## 2011-11-02 ENCOUNTER — Other Ambulatory Visit: Payer: Self-pay | Admitting: Obstetrics and Gynecology

## 2011-11-02 DIAGNOSIS — Z1231 Encounter for screening mammogram for malignant neoplasm of breast: Secondary | ICD-10-CM

## 2011-12-05 DIAGNOSIS — E039 Hypothyroidism, unspecified: Secondary | ICD-10-CM | POA: Insufficient documentation

## 2011-12-12 ENCOUNTER — Encounter: Payer: Self-pay | Admitting: Internal Medicine

## 2011-12-12 ENCOUNTER — Ambulatory Visit
Admission: RE | Admit: 2011-12-12 | Discharge: 2011-12-12 | Disposition: A | Discharge status: Home / Self Care | Payer: BC Managed Care – PPO | Source: Ambulatory Visit | Attending: Obstetrics and Gynecology | Admitting: Obstetrics and Gynecology

## 2011-12-12 ENCOUNTER — Ambulatory Visit (INDEPENDENT_AMBULATORY_CARE_PROVIDER_SITE_OTHER): Payer: BC Managed Care – PPO | Admitting: Internal Medicine

## 2011-12-12 VITALS — BP 130/80 | HR 77 | Ht 66.0 in | Wt 197.0 lb

## 2011-12-12 DIAGNOSIS — R0602 Shortness of breath: Secondary | ICD-10-CM

## 2011-12-12 DIAGNOSIS — E785 Hyperlipidemia, unspecified: Secondary | ICD-10-CM

## 2011-12-12 DIAGNOSIS — I10 Essential (primary) hypertension: Secondary | ICD-10-CM

## 2011-12-12 DIAGNOSIS — Z1231 Encounter for screening mammogram for malignant neoplasm of breast: Secondary | ICD-10-CM

## 2011-12-12 LAB — BASIC METABOLIC PANEL
BUN: 11 mg/dL (ref 6–23)
Calcium: 9.1 mg/dL (ref 8.4–10.5)
GFR: 106.49 mL/min (ref >60.00)

## 2011-12-12 LAB — CBC WITH DIFFERENTIAL/PLATELET
Basophils Relative: 1.7 % (ref 0.0–3.0)
Eosinophils Absolute: 0.3 10*3/uL (ref 0.0–0.7)
Eosinophils Relative: 2.9 % (ref 0.0–5.0)
Lymphocytes Relative: 30.7 % (ref 12.0–46.0)
Lymphs Abs: 2.8 10*3/uL (ref 0.7–4.0)
Monocytes Absolute: 0.8 10*3/uL (ref 0.1–1.0)
Monocytes Relative: 8.8 % (ref 3.0–12.0)
RDW: 13.5 % (ref 11.5–14.6)

## 2011-12-12 LAB — LIPID PANEL
Cholesterol: 230 mg/dL — ABNORMAL HIGH (ref 0–200)
HDL: 88.8 mg/dL (ref >39.00)
Triglycerides: 353 mg/dL — ABNORMAL HIGH (ref 0.0–149.0)

## 2011-12-12 NOTE — Progress Notes (Signed)
HPI patient is a 61 year old with a history of dyspnea and palpitations.   At that time wiht her complaints of dyspnea and with a history of mantle irradiation I scheduled her for a myoview.  This showed normal perfusion.  I saw her back in Feb 2012. Patient complains of some shoulder tightness.  No dizziness.  No CP  No signif SOB. Walks when can 3x per day., No Known Allergies  Current Outpatient Prescriptions  Medication Sig Dispense Refill  . atenolol (TENORMIN) 25 MG tablet Take 1 tablet (25 mg total) by mouth daily.  90 tablet  0  . levothyroxine (SYNTHROID, LEVOTHROID) 50 MCG tablet Take 50 mcg by mouth daily.      Marland Kitchen omeprazole (PRILOSEC) 20 MG capsule Take 20 mg by mouth daily. As needed        Past Medical History  Diagnosis Date  . 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 Hodgkin disease     HISTORY OF  . Hypothyroidism   . Obstructive sleep apnea 05/21/09    USES CPAP    Past Surgical History  Procedure Date  . Splenectomy, total 1977  . Tonsillectomy   . Hysteroscopy 040110    HYSTEROSCOPY,D&C FOR PMB AND ENDO POLYP  . Open reduction and internal fixation of tibial shaft fracture with ext into the plafond 03/29/07    Wilkie Aye, M.D. [facility])    Family History  Problem Relation Age of Onset  . Breast cancer Maternal Grandmother     History   Social History  . Marital Status: Married    Spouse Name: N/A    Number of Children: N/A  . Years of Education: N/A   Occupational History  . Not on file.   Social History Main Topics  . Smoking status: Former Smoker -- 0.2 packs/day for 40 years    Types: Cigarettes  . Smokeless tobacco: Former Neurosurgeon    Quit date: 10/17/2008  . Alcohol Use: Yes  . Drug Use:   . Sexually Active:    Other Topics Concern  . Not on file   Social History Narrative  . No narrative on file    Review of Systems:  All systems reviewed.  They are negative to the above problem except as previously stated.  Vital Signs: BP  130/80  Pulse 77  Ht 5\' 6"  (1.676 m)  Wt 197 lb (89.359 kg)  BMI 31.80 kg/m2  Physical Exam Patinet is in NAD HEENT:  Normocephalic, atraumatic. EOMI, PERRLA.  Neck: JVP is normal. No thyromegaly. No bruits.  Lungs: clear to auscultation. No rales no wheezes.  Heart: Regular rate and rhythm. Normal S1, S2. No S3.   No significant murmurs. PMI not displaced.  Abdomen:  Supple, nontender. Normal bowel sounds. No masses. No hepatomegaly.  Extremities:   Good distal pulses throughout. No lower extremity edema.  Musculoskeletal :moving all extremities.  Neuro:   alert and oriented x3.  CN II-XII grossly intact.  EKG:  SR.  77.   Assessment and Plan:

## 2011-12-12 NOTE — Patient Instructions (Addendum)
Your physician wants you to follow-up in: 12 months You will receive a reminder letter in the mail two months in advance. If you don't receive a letter, please call our office to schedule the follow-up appointment.   Lab work today cbc bmet and lipid panel  We will call you with results.

## 2011-12-13 ENCOUNTER — Encounter: Payer: Self-pay | Admitting: Obstetrics and Gynecology

## 2011-12-13 ENCOUNTER — Other Ambulatory Visit (HOSPITAL_COMMUNITY)
Admission: RE | Admit: 2011-12-13 | Discharge: 2011-12-13 | Disposition: A | Discharge status: Home / Self Care | Payer: BC Managed Care – PPO | Source: Ambulatory Visit | Attending: Obstetrics and Gynecology | Admitting: Obstetrics and Gynecology

## 2011-12-13 ENCOUNTER — Ambulatory Visit (INDEPENDENT_AMBULATORY_CARE_PROVIDER_SITE_OTHER): Payer: BC Managed Care – PPO | Admitting: Obstetrics and Gynecology

## 2011-12-13 VITALS — BP 124/72 | Ht 66.0 in | Wt 196.0 lb

## 2011-12-13 DIAGNOSIS — Z01419 Encounter for gynecological examination (general) (routine) without abnormal findings: Secondary | ICD-10-CM | POA: Insufficient documentation

## 2011-12-13 DIAGNOSIS — M899 Disorder of bone, unspecified: Secondary | ICD-10-CM

## 2011-12-13 DIAGNOSIS — E039 Hypothyroidism, unspecified: Secondary | ICD-10-CM

## 2011-12-13 DIAGNOSIS — M8580 Other specified disorders of bone density and structure, unspecified site: Secondary | ICD-10-CM

## 2011-12-13 MED ORDER — LEVOTHYROXINE SODIUM 50 MCG PO TABS: 50.0000 ug | ORAL_TABLET | Freq: Every day | ORAL | Status: DC

## 2011-12-13 NOTE — Progress Notes (Signed)
Patient came to see me today for her annual GYN exam. She is doing well postmenopausally without medication. She is having no vaginal bleeding. She is having no pelvic pain. She is scheduled mammogram for tomorrow. She had normal bone density 15 years ago. She had a colonoscopy with removal 7 polyps and is scheduled for followup this month. Her last Pap smear showed ascus without high-risk HPV. She was placed in the computer for six-month followup but did not come in. In the 1970s she had dysplasia treated with cryosurgery. She is doing well on her thyroid medication for hypothyroidism. Other lab work is done through Dr. Tenny Craw. She has successfully discontinued her Lexapro.patient to get shingles vaccine at drug store.  Physical examination: Kennon Portela present. HEENT within normal limits. Neck: Thyroid not large. No masses. Supraclavicular nodes: not enlarged. Breasts: Examined in both sitting midline position. No skin changes and no masses. Abdomen: Soft no guarding rebound or masses or hernia. Pelvic: External: Within normal limits. BUS: Within normal limits. Vaginal:within normal limits. Good estrogen effect. No evidence of cystocele rectocele or enterocele. Cervix: clean. Uterus: Normal size and shape. Adnexa: No masses. Rectovaginal exam: Confirmatory and negative. Extremities: Within normal limits.  Assessment: #1. 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 History of CIN treated with cryosurgery #3. Hypothyroidism  Plan: Mammogram. Bone density. TSH checked. Six-month recall if Pap okay.

## 2011-12-14 NOTE — Assessment & Plan Note (Signed)
Patient doing well on currnet regimen.  I would continue.  Recomm she stay active, actually increase her activity to most days per week.

## 2011-12-14 NOTE — Assessment & Plan Note (Signed)
Will set up for fasting lipid panel.

## 2011-12-14 NOTE — Assessment & Plan Note (Signed)
Adequate control on meds.

## 2011-12-15 ENCOUNTER — Encounter: Payer: Self-pay | Admitting: Obstetrics and Gynecology

## 2011-12-16 ENCOUNTER — Other Ambulatory Visit: Payer: Self-pay | Admitting: *Deleted

## 2011-12-16 DIAGNOSIS — Z01419 Encounter for gynecological examination (general) (routine) without abnormal findings: Secondary | ICD-10-CM

## 2011-12-16 LAB — URINALYSIS W MICROSCOPIC + REFLEX CULTURE

## 2011-12-22 ENCOUNTER — Other Ambulatory Visit: Payer: Self-pay | Admitting: Obstetrics and Gynecology

## 2011-12-26 ENCOUNTER — Other Ambulatory Visit: Payer: Self-pay | Admitting: Internal Medicine

## 2011-12-30 ENCOUNTER — Encounter: Payer: Self-pay | Admitting: Obstetrics and Gynecology

## 2012-01-18 ENCOUNTER — Ambulatory Visit (INDEPENDENT_AMBULATORY_CARE_PROVIDER_SITE_OTHER): Payer: BC Managed Care – PPO

## 2012-01-18 DIAGNOSIS — M899 Disorder of bone, unspecified: Secondary | ICD-10-CM

## 2012-01-18 DIAGNOSIS — M8580 Other specified disorders of bone density and structure, unspecified site: Secondary | ICD-10-CM

## 2012-02-08 ENCOUNTER — Telehealth: Payer: Self-pay | Admitting: *Deleted

## 2012-02-08 MED ORDER — LEVOTHYROXINE SODIUM 50 MCG PO TABS: 50.0000 ug | ORAL_TABLET | Freq: Every day | ORAL | Status: DC

## 2012-02-08 NOTE — Telephone Encounter (Signed)
Pt is currently taking levothyroxine 50 mcg since Feb 2013, pt has noticed since being on medication she has developed a red rash on her chest area that itches. Pt has monitored this for a while now and change things since as her detergent and noticed that rash is still there. Pt would like to go back on brand synthroid if possible? Please advise

## 2012-02-08 NOTE — Telephone Encounter (Signed)
Pt informed with the below note, rx sent. 

## 2012-02-08 NOTE — Telephone Encounter (Signed)
Switch back to brand. If rash persists she should see her dermatologist.

## 2012-02-08 NOTE — Telephone Encounter (Signed)
Addended by: Aura Camps on: 02/08/2012 11:11 AM   Modules accepted: Orders

## 2012-10-12 ENCOUNTER — Other Ambulatory Visit: Payer: Self-pay | Admitting: Internal Medicine

## 2013-01-09 ENCOUNTER — Emergency Department (HOSPITAL_COMMUNITY)
Admission: EM | Admit: 2013-01-09 | Discharge: 2013-01-10 | Disposition: A | Discharge status: Home / Self Care | Payer: BC Managed Care – PPO | Attending: Emergency Medicine | Admitting: Emergency Medicine

## 2013-01-09 ENCOUNTER — Emergency Department (HOSPITAL_COMMUNITY): Payer: BC Managed Care – PPO

## 2013-01-09 DIAGNOSIS — Z7982 Long term (current) use of aspirin: Secondary | ICD-10-CM | POA: Insufficient documentation

## 2013-01-09 DIAGNOSIS — G4733 Obstructive sleep apnea (adult) (pediatric): Secondary | ICD-10-CM | POA: Insufficient documentation

## 2013-01-09 DIAGNOSIS — F411 Generalized anxiety disorder: Secondary | ICD-10-CM | POA: Insufficient documentation

## 2013-01-09 DIAGNOSIS — Z79899 Other long term (current) drug therapy: Secondary | ICD-10-CM | POA: Insufficient documentation

## 2013-01-09 DIAGNOSIS — Z8742 Personal history of other diseases of the female genital tract: Secondary | ICD-10-CM | POA: Insufficient documentation

## 2013-01-09 DIAGNOSIS — F419 Anxiety disorder, unspecified: Secondary | ICD-10-CM

## 2013-01-09 DIAGNOSIS — E039 Hypothyroidism, unspecified: Secondary | ICD-10-CM | POA: Insufficient documentation

## 2013-01-09 DIAGNOSIS — Z9989 Dependence on other enabling machines and devices: Secondary | ICD-10-CM | POA: Insufficient documentation

## 2013-01-09 DIAGNOSIS — R0602 Shortness of breath: Secondary | ICD-10-CM | POA: Insufficient documentation

## 2013-01-09 DIAGNOSIS — I10 Essential (primary) hypertension: Secondary | ICD-10-CM | POA: Insufficient documentation

## 2013-01-09 DIAGNOSIS — Z87891 Personal history of nicotine dependence: Secondary | ICD-10-CM | POA: Insufficient documentation

## 2013-01-09 LAB — BASIC METABOLIC PANEL
BUN: 12 mg/dL (ref 6–23)
Chloride: 102 mEq/L (ref 96–112)
Glucose, Bld: 100 mg/dL — ABNORMAL HIGH (ref 70–99)
Potassium: 4.2 mEq/L (ref 3.5–5.1)

## 2013-01-09 LAB — CBC
HCT: 40.3 % (ref 36.0–46.0)
Hemoglobin: 13.6 g/dL (ref 12.0–15.0)
RBC: 4.17 MIL/uL (ref 3.87–5.11)
WBC: 13.7 10*3/uL — ABNORMAL HIGH (ref 4.0–10.5)

## 2013-01-09 LAB — POCT I-STAT TROPONIN I: Troponin i, poc: 0 ng/mL (ref 0.00–0.08)

## 2013-01-09 LAB — D-DIMER, QUANTITATIVE: D-Dimer, Quant: 0.27 ug/mL-FEU (ref 0.00–0.48)

## 2013-01-09 NOTE — ED Notes (Signed)
Pt states anxiety, a feeling of SOB, and she can feel her pulse in her face. Feeling exacerbated by sitting, and relived by walking around.  Denies N/V/D, fever. Symptoms include sweating last night and chills.

## 2013-01-09 NOTE — ED Provider Notes (Signed)
History     CSN: [id number]  Arrival date & time 01/09/13  2015   First MD Initiated Contact with Patient 01/09/13 2038      Chief Complaint  Patient presents with  . Shortness of Breath    (Consider location/radiation/quality/duration/timing/severity/associated sxs/prior treatment) HPI [name] is a 62 year old female with history of HTN, hypothyroidism, and OSA who presents to the ED with shortness of breath. She states this started late Monday night. She woke up at 3 am diaphoretic and SOB. She walked around and states she felt SOB but didn't feel like she couldn't breath so she tried to calm herself and take deep breaths. She denies chest pain or PND. She thought maybe her sleep apnea had caused her to wake up feeling short of breath, so last night she used her CPAP machine. She states the same symptoms occurred again around 3 am. She woke up with a feeling of SOB and diaphoresis. She got up and walked around and tried to take more deep breaths and calm herself but felt like she couldn't take a deep breath. She denies and pain on inspiration. Her husband states she has been fidgety and can't sit still. She reports having a mild headache today. Her husband reports her BP was up to 190/90 earlier today. She denies dizziness and feeling lightheaded. She denies fever, chills, cough, coryza, and pharyngitis. She also reports feeling a "knot" on left lateral neck. She states she is worried due to her past history of Hodgkin's disease. She denies odynophagia or dysphagia.  Past Medical History  Diagnosis Date  . 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 Hodgkin disease     HISTORY OF  . Hypothyroidism   . Obstructive sleep apnea 05/21/09    USES CPAP  . Endometrial polyp     Past Surgical History  Procedure Laterality Date  . Splenectomy, total  1977  . Tonsillectomy    . Hysteroscopy  040110    HYSTEROSCOPY,D&C FOR PMB AND ENDO POLYP  . Open reduction and internal fixation of  tibial shaft fracture with ext into the plafond  03/29/07    Wilkie Aye, M.D. [facility])  . Dilation and curettage of uterus    . Colposcopy    . Gynecologic cryosurgery      Family History  Problem Relation Age of Onset  . Breast cancer Maternal Grandmother     History  Substance Use Topics  . Smoking status: Former Smoker -- 0.25 packs/day for 40 years    Types: Cigarettes  . Smokeless tobacco: Former Neurosurgeon    Quit date: 10/17/2008  . Alcohol Use: Yes    OB History   Grav Para Term Preterm Abortions TAB SAB Ect Mult Living   2 1 1  1     1       Review of Systems All other systems negative except as documented in the HPI. All pertinent positives and negatives as reviewed in the HPI.  Allergies  Review of patient's allergies indicates no known allergies.  Home Medications   Current Outpatient Rx  Name  Route  Sig  Dispense  Refill  . aspirin 81 MG tablet   Oral   Take 162 mg by mouth once.          Marland Kitchen atenolol (TENORMIN) 25 MG tablet   Oral   Take 25 mg by mouth daily.         Marland Kitchen levothyroxine (SYNTHROID, LEVOTHROID) 50 MCG tablet   Oral   Take  50 mcg by mouth daily.         Marland Kitchen omeprazole (PRILOSEC) 20 MG capsule   Oral   Take 20 mg by mouth daily as needed (for heartburn).            BP 177/83  Pulse 91  Temp(Src) 97.9 F (36.6 C) (Oral)  Resp 18  SpO2 97%  Physical Exam  Constitutional: She is oriented to person, place, and time. She appears well-developed and well-nourished.  HENT:  Head: Normocephalic and atraumatic.  Neck: Normal range of motion. Neck supple. No JVD present. No thyromegaly present.  Cardiovascular: Normal rate, regular rhythm, normal heart sounds and intact distal pulses.   Pulmonary/Chest: Effort normal and breath sounds normal.  Abdominal: Soft. Bowel sounds are normal.  Neurological: She is alert and oriented to person, place, and time.  Skin: Skin is warm and dry.    ED Course  Procedures (including critical  care time)  Labs Reviewed  CBC - Abnormal; Notable for the following:    WBC 13.7 (*)    All other components within normal limits  BASIC METABOLIC PANEL - Abnormal; Notable for the following:    Glucose, Bld 100 (*)    GFR calc non Af Amer 72 (*)    GFR calc Af Amer 83 (*)    All other components within normal limits  PRO B NATRIURETIC PEPTIDE  POCT I-STAT TROPONIN I    The patient most likely has some component of anxiety with these episodes. I advised her that this may not be the total explanation for her symptoms. I did advise her to follow up on her node in her neck with ENT/PCP.The patient is advised to return here for any worsening in her condition. This seems less likely to be her heart based on her HPI and PE. Patient is PERC negative and negative d-Dimer   MDM  MDM Reviewed: nursing note and vitals Interpretation: labs, ECG and x-ray      Date: 01/10/2013  Rate: 91  Rhythm: normal sinus rhythm  QRS Axis: normal  Intervals: normal  ST/T Wave abnormalities: normal  Conduction Disutrbances:none  Narrative Interpretation:   Old EKG Reviewed: none available         Carlyle Dolly, PA-C 01/10/13 0005

## 2013-01-10 MED ORDER — LORAZEPAM 0.5 MG PO TABS: 0.5000 mg | ORAL_TABLET | Freq: Three times a day (TID) | ORAL | Status: DC

## 2013-01-10 NOTE — ED Provider Notes (Signed)
Medical screening examination/treatment/procedure(s) were conducted as a shared visit with non-physician practitioner(s) and myself.  I personally evaluated the patient during the encounter  Pt seen with PA Lawyer (I was present on his examination) and she was in no distress.  EKG reviewed.  D-dimer negative.  I doubt ACS given history/exam.    Joya Gaskins, MD 01/10/13 1110

## 2013-01-10 NOTE — ED Notes (Signed)
Pt dc to home.   Pt states understanding to dc instructions.  Pt ambulatory to exit without difficulty.  Pt denies need for w/c. 

## 2013-01-16 ENCOUNTER — Encounter: Payer: Self-pay | Admitting: Family Medicine

## 2013-01-16 ENCOUNTER — Ambulatory Visit (INDEPENDENT_AMBULATORY_CARE_PROVIDER_SITE_OTHER): Payer: BC Managed Care – PPO | Admitting: Family Medicine

## 2013-01-16 VITALS — BP 120/70 | HR 88 | Temp 98.0°F | Ht 66.0 in | Wt 194.0 lb

## 2013-01-16 DIAGNOSIS — G4733 Obstructive sleep apnea (adult) (pediatric): Secondary | ICD-10-CM

## 2013-01-16 DIAGNOSIS — F3289 Other specified depressive episodes: Secondary | ICD-10-CM

## 2013-01-16 DIAGNOSIS — R0609 Other forms of dyspnea: Secondary | ICD-10-CM

## 2013-01-16 DIAGNOSIS — R0600 Dyspnea, unspecified: Secondary | ICD-10-CM

## 2013-01-16 DIAGNOSIS — E785 Hyperlipidemia, unspecified: Secondary | ICD-10-CM

## 2013-01-16 DIAGNOSIS — I10 Essential (primary) hypertension: Secondary | ICD-10-CM

## 2013-01-16 DIAGNOSIS — R220 Localized swelling, mass and lump, head: Secondary | ICD-10-CM

## 2013-01-16 DIAGNOSIS — E039 Hypothyroidism, unspecified: Secondary | ICD-10-CM

## 2013-01-16 DIAGNOSIS — R0989 Other specified symptoms and signs involving the circulatory and respiratory systems: Secondary | ICD-10-CM

## 2013-01-16 DIAGNOSIS — F329 Major depressive disorder, single episode, unspecified: Secondary | ICD-10-CM

## 2013-01-16 DIAGNOSIS — R221 Localized swelling, mass and lump, neck: Secondary | ICD-10-CM

## 2013-01-16 LAB — COMPREHENSIVE METABOLIC PANEL
CO2: 28 mEq/L (ref 19–32)
Calcium: 9.3 mg/dL (ref 8.4–10.5)
Chloride: 102 mEq/L (ref 96–112)
GFR: 81.09 mL/min (ref >60.00)
Glucose, Bld: 100 mg/dL — ABNORMAL HIGH (ref 70–99)
Sodium: 139 mEq/L (ref 135–145)
Total Bilirubin: 0.6 mg/dL (ref 0.3–1.2)
Total Protein: 7.7 g/dL (ref 6.0–8.3)

## 2013-01-16 LAB — LIPID PANEL
HDL: 86.5 mg/dL (ref >39.00)
Triglycerides: 297 mg/dL — ABNORMAL HIGH (ref 0.0–149.0)

## 2013-01-16 LAB — TSH: TSH: 2.96 u[IU]/mL (ref 0.35–5.50)

## 2013-01-16 MED ORDER — CITALOPRAM HYDROBROMIDE 20 MG PO TABS: 20.0000 mg | ORAL_TABLET | Freq: Every day | ORAL | Status: DC

## 2013-01-16 MED ORDER — FLUTICASONE PROPIONATE 50 MCG/ACT NA SUSP: 1.0000 | Freq: Every day | NASAL | Status: DC

## 2013-01-16 NOTE — Addendum Note (Signed)
Addended by: Azucena Freed on: 01/16/2013 10:20 AM   Modules accepted: Orders

## 2013-01-16 NOTE — Patient Instructions (Signed)
-  start celexa daily and see counselor for your depression and anxiety  -schedule appointment with your cardiologist in the next month  -we placed a referral for pulmonology to discuss your sleep apnea and trouble breathing and a referral to the ear nose and throat doctor for your neck lump  -We have ordered labs or studies at this visit. It can take up to 1-2 weeks for results and processing. We will contact you with instructions IF your results are abnormal. Normal results will be released to your [facility]. If you have not heard from [place] or can not find your results in [facility] in 2 weeks please contact our office.  -schedule appt with your gynecologist in the next 3 months for your annual exam  -follow up with me in one month

## 2013-01-16 NOTE — Progress Notes (Signed)
Chief Complaint  Patient presents with  . Establish Care    HPI:  Sabrina Melton is here to establish care. Sees gyn for female care and Dr. Tenny Melton in cardiology. Last PCP and physical:  Has the following chronic problems and concerns today:  Patient Active Problem List  Diagnosis  . Other and unspecified hyperlipidemia  . DEPRESSION  . OBSTRUCTIVE SLEEP APNEA  . HYPERTENSION, UNSPECIFIED  . Hypothyroidism  . Hyperlipemia  . Dyspnea   Orthopnea: -occurred a few nights in a row at the same time a few weeks a ago -seen in ED and thought to be anxiety, however WBC was mildly elevated and BNP was elevated - these were not mentioned in ED notes, apparently EKG was fine -given ativan in ED but doesn't want to use, on lexapro and prozac and saw a psychiatrist  in the past - she is down and depressed on most days over the last year and can't remember the last time she enjoyed doing anything -also has hx of workup for dyspnea and palpitations tx with atenolol -hx of OSA treated with CPAP in the past but she stopped the CPAP about 2 years ago -she report she has continue dyspnea that occurs at night - CPAP helps some - but if she doesn't use CPAP she get symptoms and this makes her really anxious. She hates using the CPAP. Wants to see Dr. Shelle Melton again - for re-evaluation of OSA and other treatment options. -nose has been stuffy - has tried afrin and helps. No coughing or wheezing. Does have acid reflux - this she reports is well controlled on Prilosec.  -reports HLD and hx of Fatty liver dx by GI in the past -she is going to restart exercise -denies: CP, SOB currently, weight loss, anorexia, dizziness, GI bleeding, SI  Health Maintenance: -followed by Dr, Sabrina Melton in gyn for hx abnormal pap and well woman exams - last seen 12/13/11 with normal pap and TSH - needs to schedule follow up.  -utd on pneumo, shingles, tetanus vaccines ROS: See pertinent positives and negatives per HPI.  Past  Medical History  Diagnosis Date  . 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 Hodgkin disease     HISTORY OF  . Hypothyroidism   . Obstructive sleep apnea 05/21/09    USES CPAP  . Endometrial polyp   . LIVER FUNCTION TESTS, ABNORMAL, HX OF 01/30/2009    Qualifier: Diagnosis of  By: Sabrina Craw, MD, Blenda Peals     Family History  Problem Relation Age of Onset  . Breast cancer Maternal Grandmother   . Hyperlipidemia Father     History   Social History  . Marital Status: Married    Spouse Name: N/A    Number of Children: N/A  . Years of Education: N/A   Social History Main Topics  . Smoking status: Former Smoker -- 0.25 packs/day for 40 years    Types: Cigarettes  . Smokeless tobacco: Former Neurosurgeon    Quit date: 10/17/2008  . Alcohol Use: Yes     Comment: 1-2 drink a few times per week  . Drug Use: None  . Sexually Active: Yes    Birth Control/ Protection: Post-menopausal   Other Topics Concern  . None   Social History Narrative   Work or School: Restaurant manager, fast food Situation: lives with husband      Spiritual Beliefs: Christian      Lifestyle: no regular exercise, diet is poor  Current outpatient prescriptions:aspirin 81 MG tablet, Take 162 mg by mouth once. , Disp: , Rfl: ;  atenolol (TENORMIN) 25 MG tablet, Take 25 mg by mouth daily., Disp: , Rfl: ;  levothyroxine (SYNTHROID, LEVOTHROID) 50 MCG tablet, Take 50 mcg by mouth daily., Disp: , Rfl: ;  omeprazole (PRILOSEC) 20 MG capsule, Take 20 mg by mouth daily as needed (for heartburn). , Disp: , Rfl:  citalopram (CELEXA) 20 MG tablet, Take 1 tablet (20 mg total) by mouth daily., Disp: 30 tablet, Rfl: 3  EXAM:  Filed Vitals:   01/16/13 0814  BP: 120/70  Pulse: 88  Temp: 98 F (36.7 C)    Body mass index is 31.33 kg/(m^2).  GENERAL: vitals reviewed and listed above, alert, oriented, appears well hydrated and in no acute distress  HEENT: atraumatic, conjunttiva clear, no obvious  abnormalities on inspection of external nose and ears  NECK: no obvious masses on inspection  LUNGS: clear to auscultation bilaterally, no wheezes, rales or rhonchi, good air movement  CV: HRRR, SEM, no peripheral edema  MS: moves all extremities without noticeable abnormality  PSYCH: pleasant and cooperative, no obvious depression or anxiety  ASSESSMENT AND PLAN:  Discussed the following assessment and plan:  Hyperlipemia - Plan: Lipid Panel  Dyspnea - Plan: Ambulatory referral to Pulmonology  DEPRESSION - Plan: citalopram (CELEXA) 20 MG tablet  OBSTRUCTIVE SLEEP APNEA  HYPERTENSION, UNSPECIFIED - Plan: Hemoglobin A1c, CMP  Hypothyroidism - Plan: TSH  Neck mass - Plan: Ambulatory referral to ENT  -We reviewed the PMH, PSH, FH, SH, Meds and Allergies. -Wants to see Dr. Shelle Melton again in pulm for re-eval of OAS and dyspnea - referral placed -also discussed we should evaluated by cardiology as is having trouble breathing at night and has heart murmur she reports she did not know about and she would like to see Dr. Tenny Melton again for this and will schedule an appointment -Advised we restart a medication for her clinical depression/GAD and that she see a counselor - discussed options and risks and will start celexa - she does not wish to take a benzo and I do not feel this is the best option for her anyways with her OSA, dyspnea - discontinued -Wants to see ENT for lump on neck - referral placed -will check FASTING LABS today: TSH, lipid, CMP, hgbA1c -discussed importance of healthy diet and regular exercise- no strenuous exercise until seen by her cardiologist >45 minutes spent with this pt  -Patient advised to return or notify a doctor immediately if symptoms worsen or persist or new concerns arise.  There are no Patient Instructions on file for this visit.   Kriste Basque R.

## 2013-01-17 ENCOUNTER — Other Ambulatory Visit: Payer: Self-pay | Admitting: Otolaryngology

## 2013-01-17 ENCOUNTER — Telehealth: Payer: Self-pay | Admitting: Family Medicine

## 2013-01-17 DIAGNOSIS — Z8571 Personal history of Hodgkin lymphoma: Secondary | ICD-10-CM

## 2013-01-17 DIAGNOSIS — D497 Neoplasm of unspecified behavior of endocrine glands and other parts of nervous system: Secondary | ICD-10-CM

## 2013-01-17 NOTE — Telephone Encounter (Signed)
Left a message for pt to return call 

## 2013-01-17 NOTE — Telephone Encounter (Signed)
Please let patient know, since not yet signed up for mychart, wanted to contact regarding lab results. Recommend signing up for mychart to see these results in about 10 days.  -cholesterol is a bit high -diabetes screening lab is a little high (>5.6) indicating a risk for developing diabetes -other labs look good  The best treatment to hopefully reverse these findings and prevent adverse health outcomes is a healthy diet and regular exercise.

## 2013-01-18 NOTE — Telephone Encounter (Signed)
Called and spoke with pt and pt is aware.  

## 2013-01-18 NOTE — Telephone Encounter (Signed)
Labs mailed to pt's verified address.

## 2013-01-21 ENCOUNTER — Encounter: Payer: Self-pay | Admitting: Family Medicine

## 2013-01-21 NOTE — Progress Notes (Signed)
Received OV note from ENT from 01/16/13. Thyroid nodule - getting [place] and [place] tx with afrin, antihistamine OTC and nasal saline. OV note scanned in.

## 2013-01-23 ENCOUNTER — Ambulatory Visit
Admission: RE | Admit: 2013-01-23 | Discharge: 2013-01-23 | Disposition: A | Discharge status: Home / Self Care | Payer: BC Managed Care – PPO | Source: Ambulatory Visit | Attending: Otolaryngology | Admitting: Otolaryngology

## 2013-01-23 DIAGNOSIS — D497 Neoplasm of unspecified behavior of endocrine glands and other parts of nervous system: Secondary | ICD-10-CM

## 2013-01-23 DIAGNOSIS — Z8571 Personal history of Hodgkin lymphoma: Secondary | ICD-10-CM

## 2013-01-31 ENCOUNTER — Other Ambulatory Visit (HOSPITAL_COMMUNITY)
Admission: RE | Admit: 2013-01-31 | Discharge: 2013-01-31 | Disposition: A | Discharge status: Home / Self Care | Payer: BC Managed Care – PPO | Source: Ambulatory Visit | Attending: Otolaryngology | Admitting: Otolaryngology

## 2013-01-31 ENCOUNTER — Other Ambulatory Visit: Payer: Self-pay | Admitting: Otolaryngology

## 2013-01-31 DIAGNOSIS — E049 Nontoxic goiter, unspecified: Secondary | ICD-10-CM | POA: Insufficient documentation

## 2013-02-04 ENCOUNTER — Other Ambulatory Visit: Payer: Self-pay | Admitting: Obstetrics and Gynecology

## 2013-02-07 ENCOUNTER — Institutional Professional Consult (permissible substitution): Payer: BC Managed Care – PPO | Admitting: Emergency Medicine

## 2013-02-13 ENCOUNTER — Telehealth: Payer: Self-pay

## 2013-02-13 ENCOUNTER — Encounter: Payer: Self-pay | Admitting: Pulmonary Disease

## 2013-02-13 ENCOUNTER — Ambulatory Visit (INDEPENDENT_AMBULATORY_CARE_PROVIDER_SITE_OTHER): Payer: BC Managed Care – PPO | Admitting: Pulmonary Disease

## 2013-02-13 ENCOUNTER — Telehealth: Payer: Self-pay | Admitting: Family Medicine

## 2013-02-13 VITALS — BP 124/84 | HR 86 | Temp 98.0°F | Ht 66.0 in | Wt 195.8 lb

## 2013-02-13 DIAGNOSIS — G4733 Obstructive sleep apnea (adult) (pediatric): Secondary | ICD-10-CM

## 2013-02-13 MED ORDER — LEVOTHYROXINE SODIUM 50 MCG PO TABS: 50.0000 ug | ORAL_TABLET | Freq: Every day | ORAL | Status: DC

## 2013-02-13 NOTE — Progress Notes (Signed)
Subjective:    Patient ID: Sabrina Melton, female    DOB: 07-Jul-1951, 62 y.o.   MRN: [id number]  HPI The patient is a 62 year old female who comes in today for management of obstructive sleep apnea.  She has been seen in the distant past with severe OSA, with a sleep study in 2010 showing an AHI of 56 events per hour.  She was started on CPAP, but quit using it 2 years ago because of ongoing mask leaks and no wheezes coming from the machine.  Since she has been off the device, she has had ongoing awakenings, as well as nonrestorative sleep.  This month, she had a severe gasping arousal with prolonged dyspnea thereafter, and decided to get back on the device.  Her current mask is old and leaking, and she is overdue for supplies.  The patient has been having significant sleep pressure during the day as well.  She tells me that her weight is neutral since the last visit.  Her Epworth sleepiness score today is 15.   Sleep Questionnaire What time do you typically go to bed?( Between what hours) [phone] at 1356 on 02/13/13 by Nita Sells, CMA How long does it take you to fall asleep? within 30 mins within 30 mins at 1356 on 02/13/13 by Marjo Bicker Mabe, CMA How many times during the night do you wake up? 2 2 at 1356 on 02/13/13 by Nita Sells, CMA What time do you get out of bed to start your day? [phone] at 1356 on 02/13/13 by Nita Sells, CMA Do you drive or operate heavy machinery in your occupation? No No at 1356 on 02/13/13 by Nita Sells, CMA How much has your weight changed (up or down) over the past two years? (In pounds) 0 oz (0 kg) 0 oz (0 kg) at 1356 on 02/13/13 by Nita Sells, CMA Have you ever had a sleep study before? Yes Yes at 1356 on 02/13/13 by Nita Sells, CMA If yes, location of study? [facility] at 1356 on 02/13/13 by Nita Sells, CMA If yes, date of study? 02/24/2009 02/24/2009 at 1356 on 02/13/13 by Marjo Bicker Mabe, CMA Do you currently use  CPAP? Yes Yes at 1356 on 02/13/13 by Nita Sells, CMA If so, what pressure? unsure pressure setting unsure pressure setting at 1356 on 02/13/13 by Marjo Bicker Mabe, CMA Do you wear oxygen at any time? No No at 1356 on 02/13/13 by Marjo Bicker Mabe, CMA   Review of Systems  Constitutional: Negative for fever and unexpected weight change.  HENT: Positive for congestion, rhinorrhea, sneezing and postnasal drip. Negative for ear pain, nosebleeds, sore throat, trouble swallowing, dental problem and sinus pressure.        Allergies   Eyes: Negative for redness and itching.  Respiratory: Positive for shortness of breath. Negative for cough, chest tightness and wheezing.   Cardiovascular: Positive for palpitations. Negative for leg swelling.  Gastrointestinal: Negative for nausea and vomiting.  Genitourinary: Negative for dysuria.  Musculoskeletal: Negative for joint swelling.  Skin: Negative for rash.  Neurological: Negative for headaches.  Hematological: Does not bruise/bleed easily.  Psychiatric/Behavioral: Positive for dysphoric mood. The patient is nervous/anxious.        Objective:   Physical Exam Constitutional: overweight female, no acute distress  HENT:  Nares patent without discharge  Oropharynx without exudate, palate and uvula are normal  Eyes:  Perrla, eomi, no scleral icterus  Neck:  No JVD, no  TMG  Cardiovascular:  Normal rate, regular rhythm, no rubs or gallops.  No murmurs        Intact distal pulses  Pulmonary :  Normal breath sounds, no stridor or respiratory distress   No rales, rhonchi, or wheezing  Abdominal:  Soft, nondistended, bowel sounds present.  No tenderness noted.   Musculoskeletal:  No lower extremity edema noted.  Lymph Nodes:  No cervical lymphadenopathy noted  Skin:  No cyanosis noted  Neurologic:  Alert, appropriate, moves all 4 extremities without obvious deficit.         Assessment & Plan:

## 2013-02-13 NOTE — Patient Instructions (Addendum)
Will get you started back on cpap.  You will need a new mask,supplies, and hoses. Will set your machine on the auto setting to re-optimize your pressure, and also see if you prefer that over the fixed pressure. Work on weight loss followup with me in 8 weeks, but call if you are having tolerance issues.

## 2013-02-13 NOTE — Assessment & Plan Note (Signed)
The patient has a history of severe obstructive sleep apnea, and has been off treatment for the last 2 years because of mask fit intolerances.  She has had persistent symptoms, and is having worsening sleep disruption.  She would like to get back on her CPAP device, and will work with her to try and find a better mask fit.  Will also put her device on the automatic setting since the pressure was bothering her somewhat as well.  Finally, I have encouraged her to work aggressively on weight loss.

## 2013-02-13 NOTE — Telephone Encounter (Signed)
Pt called and requested to have synthroid called in to pharmacy.  Rx sent to [facility].

## 2013-02-13 NOTE — Telephone Encounter (Signed)
Attempted to return call to pt.  Message left for call back.

## 2013-02-20 ENCOUNTER — Encounter: Payer: Self-pay | Admitting: Family Medicine

## 2013-02-20 ENCOUNTER — Ambulatory Visit (INDEPENDENT_AMBULATORY_CARE_PROVIDER_SITE_OTHER): Payer: BC Managed Care – PPO | Admitting: Family Medicine

## 2013-02-20 VITALS — BP 124/80 | Temp 98.4°F | Wt 196.0 lb

## 2013-02-20 DIAGNOSIS — R0600 Dyspnea, unspecified: Secondary | ICD-10-CM

## 2013-02-20 DIAGNOSIS — F329 Major depressive disorder, single episode, unspecified: Secondary | ICD-10-CM

## 2013-02-20 DIAGNOSIS — I10 Essential (primary) hypertension: Secondary | ICD-10-CM

## 2013-02-20 DIAGNOSIS — G4733 Obstructive sleep apnea (adult) (pediatric): Secondary | ICD-10-CM

## 2013-02-20 DIAGNOSIS — E785 Hyperlipidemia, unspecified: Secondary | ICD-10-CM

## 2013-02-20 DIAGNOSIS — R0989 Other specified symptoms and signs involving the circulatory and respiratory systems: Secondary | ICD-10-CM

## 2013-02-20 DIAGNOSIS — E039 Hypothyroidism, unspecified: Secondary | ICD-10-CM

## 2013-02-20 NOTE — Progress Notes (Signed)
Chief Complaint  Patient presents with  . Follow-up    HPI:  Follow up:  Depression: -advised to see counselor and started celexa last visit -she reports after about 1-2 weeks of celexa started feeling much better -has not seen a counselor -denies: depressed mood, anxiety, SOB, SI   Prediabetes/HLD: -on recent labs, lifestyle recs advised -no regular exercise, trying to work on portion size with diet  OSA/Dyspnea: -saw Dr. Shelle Iron - restarting CPAP  -was supposed to see her cardiologist - has appt on Monday -reports most sypmtoms have improved - not having SOB, CP, suffocating feeling anymore, no palpitations  Thyroid nodule/Hypothyroid: -seeing ENT - needle biopsy was indeterminate so having thyroidectomy, scheduled for thyroid surgery 5/29  ROS: See pertinent positives and negatives per HPI.  Past Medical History  Diagnosis Date  . 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 Hodgkin disease     HISTORY OF  . Hypothyroidism   . Obstructive sleep apnea 05/21/09    USES CPAP  . Endometrial polyp   . LIVER FUNCTION TESTS, ABNORMAL, HX OF 01/30/2009    Qualifier: Diagnosis of  By: Tenny Craw, MD, Blenda Peals     Family History  Problem Relation Age of Onset  . Breast cancer Maternal Grandmother   . Hyperlipidemia Father     History   Social History  . Marital Status: Married    Spouse Name: N/A    Number of Children: 1  . Years of Education: N/A   Occupational History  . Dental Assistant    Social History Main Topics  . Smoking status: Former Smoker -- 0.25 packs/day for 40 years    Types: Cigarettes  . Smokeless tobacco: Former Neurosurgeon    Quit date: 10/17/2006  . Alcohol Use: Yes     Comment: 1-2 drink a few times per week  . Drug Use: No  . Sexually Active: Yes    Birth Control/ Protection: Post-menopausal   Other Topics Concern  . 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 , Disp: , Rfl:   EXAM:  Filed Vitals:   02/20/13 1016  BP: 124/80  Temp: 98.4 F (36.9 C)    Body mass index is 31.65 kg/(m^2).  GENERAL: vitals reviewed and listed above, alert, oriented, appears well hydrated and in no acute distress  HEENT: atraumatic, conjunttiva clear, no obvious abnormalities on inspection of external nose and ears  LUNGS: clear to auscultation bilaterally, no wheezes, rales or rhonchi, good air movement  CV: HRRR, SEM, no peripheral edema  MS: moves all extremities without noticeable abnormality  PSYCH: pleasant and cooperative, no obvious depression or anxiety  ASSESSMENT AND PLAN:  Discussed the following assessment and plan:  DEPRESSION  Other and unspecified  hyperlipidemia  OBSTRUCTIVE SLEEP APNEA  HYPERTENSION, UNSPECIFIED  Hypothyroidism  Dyspnea  -continue celexa - she is doing well on this -advised healthy diet and regular cardiovascular activity -she sees cards in a few days for her murmur and dyspnea - though symptoms have improved -will have her follow up in 3-4 months and recheck labs then -Patient advised to return or notify a doctor immediately if symptoms worsen or persist or new concerns arise.  There are no Patient Instructions on file for this visit.   Kriste Basque R.

## 2013-02-25 ENCOUNTER — Ambulatory Visit: Payer: BC Managed Care – PPO | Admitting: Internal Medicine

## 2013-02-25 NOTE — H&P (Signed)
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  Sleep apnea (780.57) (G47.30).  Chronic rhinitis (472.0) (J31.0).  Panic attacks (300.01) (F41.0).  History of Hodgkin's disease (V10.72) (Z85.71). Orders  US Thyroid Ultrasound; Requested for: 16 Jan 2013. Discussed  Left thyroid nodule. We discussed that these are common especially in women, and about 95% of the time are benign. Recommend ultrasound evaluation and then will decide on additional testing after that.   Chronic/seasonal allergic rhinitis. Recommend she stay off the Afrin, continue on the nasal steroid sprays it helps. Recommended daily antihistamine over-the-counter. Recommend Netti pot daily.   Sleep apnea, continue on CPAP. She is going to see a pulmonologist in the near future. Her pharyngeal and mandibular anatomy are favorable. The only possible intervention on my part would be nasal. Continued medical therapy. Reason For Visit  Sabrina Melton is here today at the kind request of Kriste Basque for consultation and opinion or knot in neck. HPI  One year history of a palpable nodule in the left anterior neck. It hasn't changed. She has no pain associated with it. She has a history of sleep apnea was diagnosed about 4 years ago. She uses CPAP for a couple of years and then stopped. She recently started having panic attacks that wake her up at night. She just started using her CPAP again. She is scheduled to see Dr. Shelle Iron next week about this. She does started on an antidepressant for the panic attacks. She has had significant nasal congestion the past couple of weeks. She's been using Afrin. She just started on a nasal steroid inhaler. Allergies  No Known Drug Allergies. Current Meds  Aspirin EC 81 MG Oral Tablet Delayed Release;; RPT Atenolol TABS;; RPT Synthroid TABS (Levothyroxine Sodium);; RPT CeleXA TABS (Citalopram Hydrobromide);; RPT PriLOSEC OTC 20 MG Oral Tablet Delayed Release;; RPT. Active Problems  Allergic  rhinitis  (477.9) (J30.9) Heartburn  (787.1) (R12) Hypercholesteremia  (272.0) (E78.0) Hypertension  (401.9) (I10) Hypothyroidism  (244.9) (E03.9) Obstructive sleep apnea  (327.23) (G47.33). PMH  History of fracture of leg (V15.51) (Z87.81);  * had to have surgery, 16 Jan 2013 History of Hodgkin's lymphoma (V10.72) (Z85.71). PSH  Cesarean Section Gynecologic Surgery; uterine polypa Splenectomy Tonsillectomy. Family Hx  Family history of dementia: Mother (V17.2) (Z82.0) Family history of malignant neoplasm of breast (V16.3) (Z80.3). Personal Hx  Caffeine use; 1 cup daily Former smoker [phone]) [phone]); 2008 No alcohol use. ROS  Systemic: Feeling tired (fatigue).  No fever, no night sweats, and no recent weight loss. Head: No headache. Eyes: No eye symptoms. Otolaryngeal: No hearing loss, no earache, no tinnitus, and no purulent nasal discharge.  No nasal passage blockage (stuffiness).  Snoring.  No sneezing, no hoarseness, and no sore throat. Cardiovascular: No chest pain or discomfort.  Palpitations. Pulmonary: Dyspnea.  No cough  and no wheezing. Gastrointestinal: No dysphagia.  Heartburn.  No nausea, no abdominal pain, and no melena.  No diarrhea. Genitourinary: No dysuria. Endocrine: No muscle weakness. Musculoskeletal: No calf muscle cramps, no arthralgias, and no soft tissue swelling. Neurological: No dizziness, no fainting, no tingling, and no numbness. Psychological: Anxiety  and depression. Skin: No rash. 12 system ROS was obtained and reviewed on the Health Maintenance form dated today.  Positive responses are shown above.  If the symptom is not checked, the patient has denied it. Vital Signs   Recorded by Skolimowski,Sharon on 16 Jan 2013 01:47 PM BP:120/80,  Height: 66 in, Weight: 194 lb, BMI: 31.3 kg/m2,  BMI Calculated: 31.31 ,  BSA Calculated: 1.97. Physical Exam  APPEARANCE: Well developed, well nourished, in no acute distress.  Normal affect, in a  pleasant mood.  Oriented to time, place and person. COMMUNICATION: Normal voice   HEAD & FACE:  No scars, lesions or masses of head and face.  Sinuses nontender to palpation.  Salivary glands without mass or tenderness.  Facial strength symmetric.  No facial lesion, scars, or mass. EYES: EOMI with normal primary gaze alignment. Visual acuity grossly intact.  PERRLA EXTERNAL EAR & NOSE: No scars, lesions or masses  EAC & TYMPANIC MEMBRANE:  EAC shows no obstructing lesions or debris and tympanic membranes are normal bilaterally with good movement to insufflation. GROSS HEARING: Normal   TMJ:  Nontender  INTRANASAL EXAM: No polyps or purulence. Diffuse mucosal edema bilaterally with clear exudate. Bilateral septal obstruction/deviation. NASOPHARYNX: Normal, without lesions. LIPS, TEETH & GUMS: No lip lesions, normal dentition and normal gums. ORAL CAVITY/OROPHARYNX:  Oral mucosa moist without lesion or asymmetry of the palate, tongue, tonsil or posterior pharynx. NECK:  Supple without adenopathy or mass. THYROID: 2 cm soft mass left superior thyroid.  NEUROLOGIC:  No gross CN deficits. No nystagmus noted.   LYMPHATIC:  No enlarged nodes palpable. Signature  Electronically signed by : Serena Colonel  M.D.; 01/16/2013 2:11 PM EST.

## 2013-02-28 ENCOUNTER — Encounter (HOSPITAL_COMMUNITY): Payer: Self-pay | Admitting: Pharmacy Technician

## 2013-03-01 ENCOUNTER — Encounter (HOSPITAL_COMMUNITY): Payer: Self-pay

## 2013-03-01 ENCOUNTER — Encounter (HOSPITAL_COMMUNITY)
Admission: RE | Admit: 2013-03-01 | Discharge: 2013-03-01 | Disposition: A | Discharge status: Home / Self Care | Payer: BC Managed Care – PPO | Source: Ambulatory Visit | Attending: Otolaryngology | Admitting: Otolaryngology

## 2013-03-01 DIAGNOSIS — Z01812 Encounter for preprocedural laboratory examination: Secondary | ICD-10-CM | POA: Insufficient documentation

## 2013-03-01 HISTORY — DX: Other specified postprocedural states: Z98.890

## 2013-03-01 HISTORY — DX: Nausea with vomiting, unspecified: R11.2

## 2013-03-01 LAB — CBC
Hemoglobin: 12.9 g/dL (ref 12.0–15.0)
MCHC: 34 g/dL (ref 30.0–36.0)
RDW: 13.9 % (ref 11.5–15.5)
WBC: 10.1 10*3/uL (ref 4.0–10.5)

## 2013-03-01 LAB — BASIC METABOLIC PANEL
BUN: 10 mg/dL (ref 6–23)
Chloride: 101 mEq/L (ref 96–112)
Creatinine, Ser: 0.71 mg/dL (ref 0.50–1.10)
GFR calc Af Amer: >90 mL/min (ref >90)
GFR calc non Af Amer: >90 mL/min (ref >90)
Potassium: 4.5 mEq/L (ref 3.5–5.1)

## 2013-03-01 LAB — SURGICAL PCR SCREEN
MRSA, PCR: NEGATIVE
Staphylococcus aureus: NEGATIVE

## 2013-03-01 NOTE — Pre-Procedure Instructions (Signed)
Sabrina Melton  03/01/2013   Your procedure is scheduled on:  Thursday May 29  Report to [facility] at 0530 AM.  Call this number if you have problems the morning of surgery: [phone]   Remember:   Do not eat food or drink liquids after midnight.   Take these medicines the morning of surgery with A SIP OF WATER: Atenolol, Citalopram, Synthroid and Flonase nasal spray   Do not wear jewelry, make-up or nail polish.  Do not wear lotions, powders, or perfumes. You may wear deodorant.  Do not shave 48 hours prior to surgery.            Do not bring valuables to the hospital.  Contacts, dentures or bridgework may not be worn into surgery.  Leave suitcase in the car. After surgery it may be brought to your room.  For patients admitted to the hospital, checkout time is 11:00 AM the day of  discharge.   Patients discharged the day of surgery will not be allowed to drive  home.    Special Instructions: Shower using CHG 2 nights before surgery and the night before surgery.  If you shower the day of surgery use CHG.  Use special wash - you have one bottle of CHG for all showers.  You should use approximately 1/3 of the bottle for each shower.   Please read over the following fact sheets that you were given: Pain Booklet, Coughing and Deep Breathing, MRSA Information and Surgical Site Infection Prevention

## 2013-03-13 MED ORDER — CEFAZOLIN SODIUM-DEXTROSE 2-3 GM-% IV SOLR
2.0000 g | INTRAVENOUS | Status: AC
  Administered 2013-03-14: 2 g via INTRAVENOUS
  Filled 2013-03-13: qty 50

## 2013-03-14 ENCOUNTER — Encounter (HOSPITAL_COMMUNITY): Payer: Self-pay | Admitting: *Deleted

## 2013-03-14 ENCOUNTER — Encounter (HOSPITAL_COMMUNITY): Payer: Self-pay | Admitting: Certified Registered Nurse Anesthetist

## 2013-03-14 ENCOUNTER — Ambulatory Visit (HOSPITAL_COMMUNITY): Payer: BC Managed Care – PPO | Admitting: Certified Registered Nurse Anesthetist

## 2013-03-14 ENCOUNTER — Observation Stay (HOSPITAL_COMMUNITY)
Admission: RE | Admit: 2013-03-14 | Discharge: 2013-03-15 | Disposition: A | Discharge status: Home / Self Care | Payer: BC Managed Care – PPO | Source: Ambulatory Visit | Attending: Otolaryngology | Admitting: Otolaryngology

## 2013-03-14 ENCOUNTER — Encounter (HOSPITAL_COMMUNITY)
Admission: RE | Disposition: A | Discharge status: Home / Self Care | Payer: Self-pay | Source: Ambulatory Visit | Attending: Otolaryngology

## 2013-03-14 DIAGNOSIS — J310 Chronic rhinitis: Secondary | ICD-10-CM | POA: Insufficient documentation

## 2013-03-14 DIAGNOSIS — E079 Disorder of thyroid, unspecified: Secondary | ICD-10-CM

## 2013-03-14 DIAGNOSIS — G4733 Obstructive sleep apnea (adult) (pediatric): Secondary | ICD-10-CM | POA: Insufficient documentation

## 2013-03-14 DIAGNOSIS — Z8571 Personal history of Hodgkin lymphoma: Secondary | ICD-10-CM | POA: Insufficient documentation

## 2013-03-14 DIAGNOSIS — I10 Essential (primary) hypertension: Secondary | ICD-10-CM | POA: Insufficient documentation

## 2013-03-14 DIAGNOSIS — F410 Panic disorder [episodic paroxysmal anxiety] without agoraphobia: Secondary | ICD-10-CM | POA: Insufficient documentation

## 2013-03-14 DIAGNOSIS — D34 Benign neoplasm of thyroid gland: Principal | ICD-10-CM | POA: Insufficient documentation

## 2013-03-14 HISTORY — DX: Major depressive disorder, single episode, unspecified: F32.9

## 2013-03-14 HISTORY — PX: THYROID LOBECTOMY: SHX420

## 2013-03-14 HISTORY — DX: Gastro-esophageal reflux disease without esophagitis: K21.9

## 2013-03-14 HISTORY — PX: THYROIDECTOMY: SHX17

## 2013-03-14 HISTORY — DX: Depression, unspecified: F32.A

## 2013-03-14 HISTORY — DX: Essential (primary) hypertension: I10

## 2013-03-14 SURGERY — THYROIDECTOMY
Anesthesia: General | Site: Neck | Wound class: Clean

## 2013-03-14 MED ORDER — SCOPOLAMINE 1 MG/3DAYS TD PT72
1.0000 | MEDICATED_PATCH | TRANSDERMAL | Status: DC
  Administered 2013-03-14: 1 via TRANSDERMAL

## 2013-03-14 MED ORDER — NEOSTIGMINE METHYLSULFATE 1 MG/ML IJ SOLN
INTRAMUSCULAR | Status: DC | PRN
  Administered 2013-03-14: 4 mg via INTRAVENOUS

## 2013-03-14 MED ORDER — PROMETHAZINE HCL 25 MG/ML IJ SOLN
6.2500 mg | INTRAMUSCULAR | Status: AC | PRN
  Administered 2013-03-14 (×2): 6.25 mg via INTRAVENOUS

## 2013-03-14 MED ORDER — SODIUM CHLORIDE 0.9 % IR SOLN
Status: DC | PRN
  Administered 2013-03-14: 1

## 2013-03-14 MED ORDER — SUCCINYLCHOLINE CHLORIDE 20 MG/ML IJ SOLN
INTRAMUSCULAR | Status: DC | PRN
  Administered 2013-03-14: 120 mg via INTRAVENOUS

## 2013-03-14 MED ORDER — LIDOCAINE-EPINEPHRINE 1 %-1:100000 IJ SOLN: INTRAMUSCULAR | Status: DC | PRN

## 2013-03-14 MED ORDER — MIDAZOLAM HCL 5 MG/5ML IJ SOLN
INTRAMUSCULAR | Status: DC | PRN
  Administered 2013-03-14: 2 mg via INTRAVENOUS

## 2013-03-14 MED ORDER — FENTANYL CITRATE 0.05 MG/ML IJ SOLN
INTRAMUSCULAR | Status: DC | PRN
  Administered 2013-03-14: 150 ug via INTRAVENOUS
  Administered 2013-03-14: 50 ug via INTRAVENOUS

## 2013-03-14 MED ORDER — FLUTICASONE PROPIONATE 50 MCG/ACT NA SUSP
1.0000 | Freq: Every day | NASAL | Status: DC
  Filled 2013-03-14: qty 16

## 2013-03-14 MED ORDER — MEPERIDINE HCL 25 MG/ML IJ SOLN: 6.2500 mg | INTRAMUSCULAR | Status: DC | PRN

## 2013-03-14 MED ORDER — PROMETHAZINE HCL 25 MG PO TABS: 25.0000 mg | ORAL_TABLET | Freq: Four times a day (QID) | ORAL | Status: DC | PRN

## 2013-03-14 MED ORDER — LIDOCAINE-EPINEPHRINE 1 %-1:100000 IJ SOLN
INTRAMUSCULAR | Status: AC
  Filled 2013-03-14: qty 1

## 2013-03-14 MED ORDER — OXYCODONE HCL 5 MG/5ML PO SOLN: 5.0000 mg | Freq: Once | ORAL | Status: DC | PRN

## 2013-03-14 MED ORDER — PHENYLEPHRINE HCL 10 MG/ML IJ SOLN
INTRAMUSCULAR | Status: DC | PRN
  Administered 2013-03-14 (×3): 120 ug via INTRAVENOUS

## 2013-03-14 MED ORDER — EPHEDRINE SULFATE 50 MG/ML IJ SOLN
INTRAMUSCULAR | Status: DC | PRN
  Administered 2013-03-14 (×5): 10 mg via INTRAVENOUS

## 2013-03-14 MED ORDER — ATENOLOL 25 MG PO TABS
25.0000 mg | ORAL_TABLET | Freq: Every day | ORAL | Status: DC
Start: 2013-03-14 — End: 2013-03-15
  Filled 2013-03-14: qty 1

## 2013-03-14 MED ORDER — LACTATED RINGERS IV SOLN
INTRAVENOUS | Status: DC | PRN
  Administered 2013-03-14 (×2): via INTRAVENOUS

## 2013-03-14 MED ORDER — DEXTROSE-NACL 5-0.9 % IV SOLN
INTRAVENOUS | Status: DC
  Administered 2013-03-14: 21:00:00 via INTRAVENOUS

## 2013-03-14 MED ORDER — GLYCOPYRROLATE 0.2 MG/ML IJ SOLN
INTRAMUSCULAR | Status: DC | PRN
Start: 2013-03-14 — End: 2013-03-14
  Administered 2013-03-14: .6 mg via INTRAVENOUS

## 2013-03-14 MED ORDER — LIDOCAINE HCL (CARDIAC) 20 MG/ML IV SOLN
INTRAVENOUS | Status: DC | PRN
  Administered 2013-03-14: 20 mg via INTRAVENOUS

## 2013-03-14 MED ORDER — LEVOTHYROXINE SODIUM 50 MCG PO TABS
50.0000 ug | ORAL_TABLET | Freq: Every day | ORAL | Status: DC
  Filled 2013-03-14: qty 1

## 2013-03-14 MED ORDER — HYDROCODONE-ACETAMINOPHEN 5-325 MG PO TABS
1.0000 | ORAL_TABLET | ORAL | Status: DC | PRN
  Administered 2013-03-14: 1 via ORAL
  Filled 2013-03-14: qty 1

## 2013-03-14 MED ORDER — ONDANSETRON HCL 4 MG/2ML IJ SOLN
INTRAMUSCULAR | Status: DC | PRN
  Administered 2013-03-14: 4 mg via INTRAVENOUS

## 2013-03-14 MED ORDER — HYDROMORPHONE HCL PF 1 MG/ML IJ SOLN: 0.2500 mg | INTRAMUSCULAR | Status: DC | PRN

## 2013-03-14 MED ORDER — PROMETHAZINE HCL 25 MG RE SUPP
25.0000 mg | Freq: Four times a day (QID) | RECTAL | Status: DC | PRN
Start: 2013-03-14 — End: 2013-03-15

## 2013-03-14 MED ORDER — IBUPROFEN 100 MG/5ML PO SUSP
400.0000 mg | Freq: Four times a day (QID) | ORAL | Status: DC | PRN
  Filled 2013-03-14: qty 20

## 2013-03-14 MED ORDER — HYDROCODONE-ACETAMINOPHEN 7.5-325 MG PO TABS: 1.0000 | ORAL_TABLET | Freq: Four times a day (QID) | ORAL | Status: DC | PRN

## 2013-03-14 MED ORDER — ROCURONIUM BROMIDE 100 MG/10ML IV SOLN
INTRAVENOUS | Status: DC | PRN
  Administered 2013-03-14: 30 mg via INTRAVENOUS

## 2013-03-14 MED ORDER — PROMETHAZINE HCL 25 MG/ML IJ SOLN
INTRAMUSCULAR | Status: AC
  Filled 2013-03-14: qty 1

## 2013-03-14 MED ORDER — MIDAZOLAM HCL 2 MG/2ML IJ SOLN: 0.5000 mg | Freq: Once | INTRAMUSCULAR | Status: DC | PRN

## 2013-03-14 MED ORDER — SCOPOLAMINE 1 MG/3DAYS TD PT72
MEDICATED_PATCH | TRANSDERMAL | Status: AC
  Filled 2013-03-14: qty 1

## 2013-03-14 MED ORDER — PROMETHAZINE HCL 25 MG RE SUPP: 25.0000 mg | Freq: Four times a day (QID) | RECTAL | Status: DC | PRN

## 2013-03-14 MED ORDER — DEXAMETHASONE SODIUM PHOSPHATE 10 MG/ML IJ SOLN
INTRAMUSCULAR | Status: DC | PRN
  Administered 2013-03-14: 10 mg via INTRAVENOUS

## 2013-03-14 MED ORDER — PROPOFOL 10 MG/ML IV BOLUS
INTRAVENOUS | Status: DC | PRN
  Administered 2013-03-14: 50 mg via INTRAVENOUS
  Administered 2013-03-14: 160 mg via INTRAVENOUS

## 2013-03-14 MED ORDER — OXYCODONE HCL 5 MG PO TABS: 5.0000 mg | ORAL_TABLET | Freq: Once | ORAL | Status: DC | PRN

## 2013-03-14 MED ORDER — CITALOPRAM HYDROBROMIDE 20 MG PO TABS
20.0000 mg | ORAL_TABLET | Freq: Every day | ORAL | Status: DC
  Filled 2013-03-14: qty 1

## 2013-03-14 SURGICAL SUPPLY — 46 items
APPLIER CLIP 9.375 SM OPEN (CLIP)
ATTRACTOMAT 16X20 MAGNETIC DRP (DRAPES) ×2 IMPLANT
CANISTER SUCTION 2500CC (MISCELLANEOUS) ×2 IMPLANT
CLEANER TIP ELECTROSURG 2X2 (MISCELLANEOUS) ×2 IMPLANT
CLIP APPLIE 9.375 SM OPEN (CLIP) IMPLANT
CLOTH BEACON ORANGE TIMEOUT ST (SAFETY) ×2 IMPLANT
CONT SPEC 4OZ CLIKSEAL STRL BL (MISCELLANEOUS) ×2 IMPLANT
CORDS BIPOLAR (ELECTRODE) ×2 IMPLANT
COVER SURGICAL LIGHT HANDLE (MISCELLANEOUS) ×2 IMPLANT
DECANTER SPIKE VIAL GLASS SM (MISCELLANEOUS) ×2 IMPLANT
DERMABOND ADVANCED (GAUZE/BANDAGES/DRESSINGS) ×1
DERMABOND ADVANCED .7 DNX12 (GAUZE/BANDAGES/DRESSINGS) ×1 IMPLANT
DRAIN SNY 10 ROU (WOUND CARE) ×2 IMPLANT
ELECT COATED BLADE 2.86 ST (ELECTRODE) ×2 IMPLANT
ELECT REM PT RETURN 9FT ADLT (ELECTROSURGICAL) ×2
ELECTRODE REM PT RTRN 9FT ADLT (ELECTROSURGICAL) ×1 IMPLANT
EVACUATOR SILICONE 100CC (DRAIN) ×2 IMPLANT
GAUZE SPONGE 4X4 16PLY XRAY LF (GAUZE/BANDAGES/DRESSINGS) ×4 IMPLANT
GLOVE BIO SURGEON STRL SZ 6.5 (GLOVE) ×2 IMPLANT
GLOVE BIOGEL PI IND STRL 7.0 (GLOVE) ×1 IMPLANT
GLOVE BIOGEL PI INDICATOR 7.0 (GLOVE) ×1
GLOVE ECLIPSE 7.5 STRL STRAW (GLOVE) ×2 IMPLANT
GLOVE SURG SS PI 6.0 STRL IVOR (GLOVE) ×2 IMPLANT
GLOVE SURG SS PI 7.0 STRL IVOR (GLOVE) ×2 IMPLANT
GOWN PREVENTION PLUS LG XLONG (DISPOSABLE) ×2 IMPLANT
GOWN STRL NON-REIN LRG LVL3 (GOWN DISPOSABLE) ×4 IMPLANT
KIT BASIN OR (CUSTOM PROCEDURE TRAY) ×2 IMPLANT
KIT ROOM TURNOVER OR (KITS) ×2 IMPLANT
NEEDLE 27GAX1X1/2 (NEEDLE) ×2 IMPLANT
NS IRRIG 1000ML POUR BTL (IV SOLUTION) ×2 IMPLANT
PAD ARMBOARD 7.5X6 YLW CONV (MISCELLANEOUS) ×4 IMPLANT
PENCIL FOOT CONTROL (ELECTRODE) ×2 IMPLANT
SPECIMEN JAR SMALL (MISCELLANEOUS) ×2 IMPLANT
SPONGE INTESTINAL PEANUT (DISPOSABLE) ×2 IMPLANT
STAPLER VISISTAT 35W (STAPLE) ×2 IMPLANT
SUT CHROMIC 3 0 SH 27 (SUTURE) IMPLANT
SUT CHROMIC 4 0 PS 2 18 (SUTURE) ×4 IMPLANT
SUT ETHILON 3 0 PS 1 (SUTURE) ×2 IMPLANT
SUT ETHILON 5 0 P 3 18 (SUTURE)
SUT NYLON ETHILON 5-0 P-3 1X18 (SUTURE) IMPLANT
SUT SILK 3 0 REEL (SUTURE) IMPLANT
SUT SILK 4 0 REEL (SUTURE) ×2 IMPLANT
TOWEL OR 17X24 6PK STRL BLUE (TOWEL DISPOSABLE) ×2 IMPLANT
TOWEL OR 17X26 10 PK STRL BLUE (TOWEL DISPOSABLE) ×2 IMPLANT
TRAY ENT MC OR (CUSTOM PROCEDURE TRAY) ×2 IMPLANT
WATER STERILE IRR 1000ML POUR (IV SOLUTION) IMPLANT

## 2013-03-14 NOTE — Interval H&P Note (Signed)
History and Physical Interval Note:  03/14/2013 7:20 AM  Sabrina Melton  has presented today for surgery, with the diagnosis of LEFT THYROID NODULE  The various methods of treatment have been discussed with the patient and family. After consideration of risks, benefits and other options for treatment, the patient has consented to  Procedure(s): LEFT THYROID LOBECTOMY WITH FROZEN SECTION POSSIBLE TOTAL (N/A) as a surgical intervention .  The patient's history has been reviewed, patient examined, no change in status, stable for surgery.  I have reviewed the patient's chart and labs.  Questions were answered to the patient's satisfaction.     [name]

## 2013-03-14 NOTE — Preoperative (Signed)
Beta Blockers   Reason not to administer Beta Blockers:Not Applicable, Pt took Atenolol this am

## 2013-03-14 NOTE — Transfer of Care (Signed)
Immediate Anesthesia Transfer of Care Note  Patient: Sabrina NARAYANAN  Procedure(s) Performed: Procedure(s): LEFT THYROID LOBECTOMY WITH FROZEN SECTION (N/A)  Patient Location: PACU  Anesthesia Type:General  Level of Consciousness: awake, alert , oriented and patient cooperative  Airway & Oxygen Therapy: Patient Spontanous Breathing and Patient connected to nasal cannula oxygen  Post-op Assessment: Report given to PACU RN, Post -op Vital signs reviewed and stable and Patient moving all extremities X 4  Post vital signs: Reviewed and stable  Complications: No apparent anesthesia complications

## 2013-03-14 NOTE — Progress Notes (Signed)
Subjective: Doing well, minimal pain. Able to eat.  Objective: Vital signs in last 24 hours: Temp:  [97.6 F (36.4 C)-98.4 F (36.9 C)] 98.2 F (36.8 C) (05/29 1300) Pulse Rate:  [67-91] 91 (05/29 1300) Resp:  [11-22] 17 (05/29 1300) BP: (120-146)/(55-75) 146/75 mmHg (05/29 1225) SpO2:  [88 %-98 %] 98 % (05/29 1300) Weight change:  Last BM Date: 03/13/13  Intake/Output from previous day:   Intake/Output this shift: Total I/O In: 2700 [P.O.:400; I.V.:2300] Out: 26 [Drains:26]  PHYSICAL EXAM: Normal voice. No swelling. Incision intact and clean. JP in place.  Lab Results: No results found for this basename: WBC, HGB, HCT, PLT,  in the last 72 hours BMET No results found for this basename: NA, K, CL, CO2, GLUCOSE, BUN, CREATININE, CALCIUM,  in the last 72 hours  Studies/Results: No results found.  Medications: I have reviewed the patient's current medications.  Assessment/Plan: Doing well. Continue overnight obs.   LOS: 0 days   Sabrina Melton 03/14/2013, 4:56 PM

## 2013-03-14 NOTE — Op Note (Signed)
OPERATIVE REPORT  DATE OF SURGERY: 03/14/2013  PATIENT:  Sabrina Melton,  62 y.o. female  PRE-OPERATIVE DIAGNOSIS:  LEFT THYROID NODULE  POST-OPERATIVE DIAGNOSIS:  LEFT THYROID NODULE  PROCEDURE:  Procedure(s): LEFT THYROID LOBECTOMY WITH FROZEN SECTION  SURGEON:  Susy Frizzle, MD  ASSISTANTS: Aquilla Hacker, PA  ANESTHESIA:   General   EBL:  30 ml  DRAINS: 10 [place]  LOCAL MEDICATIONS USED:  None  SPECIMEN:  Left thyroid - frozen section: no definite carcinoma, deferred to permanent section.  COUNTS:  Correct  PROCEDURE DETAILS: The patient was taken to the operating room and placed on the operating table in the supine position. A shoulder roll was placed beneath the shoulder blades and the neck was extended. The neck was prepped and draped in a standard fashion. A low collar transverse incision was outlined marking pen and was incised with electrocautery. Dissection was continued down through the platysma layer. Subplatysmal flaps were elevated superiorly to the thyroid cartilage and inferiorly to the clavicle. Self-retaining thyroid retractor was used throughout the case.  The midline fascia was divided. The strap muscles were reflected laterally off the left lobe of the thyroid. The left lobe was enlarged with multiple nodules, some of which were whitish in appearance. The superior pole vasculature was identified, separately ligated between clamps and divided. 4-0 silk ties were used throughout the case. As the superior lobe was brought down the middle thyroid vein was identified, ligated and divided. The inferior vasculature was similarly treated. The recurrent nerve was identified and preserved. The ligament of Allyson Sabal was divided using electrocautery as the thyroid was brought off the trachea. The isthmus was divided. A small pyramidal lobe was taken with the specimen. The specimen was sent for pathologic evaluation. Section revealed no obvious carcinoma, oncocytic  changes were identified but no definitive carcinoma. This was deferred to permanent pathology. Palpation of the right side in the pretracheal area revealed no obvious pathology. The wound was irrigated with saline. Hemostasis was completed using bipolar cautery. The drain was left in the wound and exited to the right side of the incision and secured in place with a nylon suture. Midline fascia was reapproximated with interrupted chromic as was the platysma layer. Subcuticular closure was accomplished with chromic and Dermabond was used on the skin. The patient was awakened, extubated and transferred to recovery in stable condition.   PATIENT DISPOSITION:  To PACU, stable

## 2013-03-14 NOTE — Anesthesia Postprocedure Evaluation (Signed)
  Anesthesia Post-op Note  Patient: Sabrina Melton  Procedure(s) Performed: Procedure(s): LEFT THYROID LOBECTOMY WITH FROZEN SECTION (N/A)  Patient Location: PACU  Anesthesia Type:General  Level of Consciousness: awake, alert , oriented and patient cooperative  Airway and Oxygen Therapy: Patient Spontanous Breathing  Post-op Pain: none  Post-op Assessment: Post-op Vital signs reviewed, Patient's Cardiovascular Status Stable, Respiratory Function Stable, Patent Airway, No signs of Nausea or vomiting and Pain level controlled  Post-op Vital Signs: Reviewed and stable  Complications: No apparent anesthesia complications

## 2013-03-14 NOTE — Anesthesia Procedure Notes (Signed)
Procedure Name: Intubation Date/Time: 03/14/2013 7:40 AM Performed by: Rogelia Boga Pre-anesthesia Checklist: Patient identified, Emergency Drugs available, Suction available, Patient being monitored and Timeout performed Patient Re-evaluated:Patient Re-evaluated prior to inductionOxygen Delivery Method: Circle system utilized Preoxygenation: Pre-oxygenation with 100% oxygen Intubation Type: IV induction, Rapid sequence and Cricoid Pressure applied Laryngoscope Size: Mac and 4 Grade View: Grade III Tube type: Oral Tube size: 7.5 mm Number of attempts: 1 Airway Equipment and Method: Stylet Placement Confirmation: positive ETCO2 and breath sounds checked- equal and bilateral Secured at: 21 cm Tube secured with: Tape Dental Injury: Teeth and Oropharynx as per pre-operative assessment

## 2013-03-14 NOTE — Anesthesia Preprocedure Evaluation (Addendum)
Anesthesia Evaluation  Patient identified by MRN, date of birth, ID band Patient awake    Reviewed: Allergy & Precautions, H&P , NPO status , Patient's Chart, lab work & pertinent test results, reviewed documented beta blocker date and time   History of Anesthesia Complications (+) PONVNegative for: history of anesthetic complications  Airway Mallampati: II TM Distance: >3 FB Neck ROM: Full    Dental  (+) Dental Advisory Given   Pulmonary shortness of breath and with exertion, sleep apnea and Continuous Positive Airway Pressure Ventilation , former smoker,  breath sounds clear to auscultation  Pulmonary exam normal       Cardiovascular hypertension, Pt. on medications and Pt. on home beta blockers Rhythm:Regular Rate:Normal     Neuro/Psych Depression    GI/Hepatic Neg liver ROS, GERD-  Medicated and Controlled,  Endo/Other  Hypothyroidism   Renal/GU negative Renal ROS     Musculoskeletal   Abdominal (+) + obese,   Peds  Hematology   Anesthesia Other Findings   Reproductive/Obstetrics                          Anesthesia Physical Anesthesia Plan  ASA: II  Anesthesia Plan: General   Post-op Pain Management:    Induction: Intravenous, Rapid sequence and Cricoid pressure planned  Airway Management Planned: Oral ETT  Additional Equipment:   Intra-op Plan:   Post-operative Plan: Extubation in OR  Informed Consent: I have reviewed the patients History and Physical, chart, labs and discussed the procedure including the risks, benefits and alternatives for the proposed anesthesia with the patient or authorized representative who has indicated his/her understanding and acceptance.   Dental advisory given  Plan Discussed with: Anesthesiologist, CRNA and Surgeon  Anesthesia Plan Comments: (Plan routine monitors, GETA with RSI)      Anesthesia Quick Evaluation

## 2013-03-15 ENCOUNTER — Encounter (HOSPITAL_COMMUNITY): Payer: Self-pay | Admitting: Otolaryngology

## 2013-03-15 NOTE — Discharge Planning (Addendum)
Copy of home instructions to pt who verbalizes understanding.  rx x 2 to pt.  Will dc with all personal belongings per w/c to private car home, acomp. By husband.  Dc'd at [phone]

## 2013-03-15 NOTE — Discharge Summary (Signed)
Physician Discharge Summary  Patient ID: Sabrina Melton MRN: [id number] DOB/AGE: [birth year] 62 y.o.  Admit date: 03/14/2013 Discharge date: 03/15/2013  Admission Diagnoses:thyroid mass  Discharge Diagnoses:  Active Problems:   * No active hospital problems. *   Discharged Condition: good  Hospital Course: no complications  Consults: none  Significant Diagnostic Studies: none  Treatments: surgery: thyroid lobectomy  Discharge Exam: Blood pressure 122/65, pulse 89, temperature 97.8 F (36.6 C), temperature source Oral, resp. rate 16, SpO2 95.00%. PHYSICAL EXAM: Incision looks excellent, drain removed. Normal voice.  Disposition: 01-Home or Self Care  Discharge Orders   Future Appointments Provider Department Dept Phone   04/17/2013 2:00 PM Barbaraann Share, MD [facility] [phone]   06/26/2013 8:45 AM Terressa Koyanagi, DO [facility] [phone]   Future Orders Complete By Expires     Diet - low sodium heart healthy  As directed     Increase activity slowly  As directed         Medication List    TAKE these medications       atenolol 25 MG tablet  Commonly known as:  TENORMIN  Take 25 mg by mouth daily.     citalopram 20 MG tablet  Commonly known as:  CELEXA  Take 1 tablet (20 mg total) by mouth daily.     fluticasone 50 MCG/ACT nasal spray  Commonly known as:  FLONASE  Place 1 spray into the nose daily.     HYDROcodone-acetaminophen 7.5-325 MG per tablet  Commonly known as:  NORCO  Take 1 tablet by mouth every 6 (six) hours as needed for pain.     levothyroxine 50 MCG tablet  Commonly known as:  SYNTHROID, LEVOTHROID  Take 1 tablet (50 mcg total) by mouth daily.     promethazine 25 MG suppository  Commonly known as:  PHENERGAN  Place 1 suppository (25 mg total) rectally every 6 (six) hours as needed for nausea.           Follow-up Information   Follow up with Serena Colonel, MD. Schedule an appointment as soon as  possible for a visit in 1 week.   Contact information:   [address] [place], [place] [address] [place] [address] [phone]       Signed: Serena Colonel 03/15/2013, 7:29 AM

## 2013-04-17 ENCOUNTER — Ambulatory Visit (INDEPENDENT_AMBULATORY_CARE_PROVIDER_SITE_OTHER): Payer: BC Managed Care – PPO | Admitting: Pulmonary Disease

## 2013-04-17 ENCOUNTER — Encounter: Payer: Self-pay | Admitting: Pulmonary Disease

## 2013-04-17 VITALS — BP 128/80 | HR 95 | Temp 99.0°F | Ht 66.0 in | Wt 200.6 lb

## 2013-04-17 DIAGNOSIS — G4733 Obstructive sleep apnea (adult) (pediatric): Secondary | ICD-10-CM

## 2013-04-17 NOTE — Assessment & Plan Note (Signed)
The patient has done very well on CPAP since last visit, and her download shows excellent compliance with good control of her sleep apnea.  Her optimal pressure is in the 12 cm range, but the patient would prefer to stay on the automatic setting since she did so well.  I also encouraged her to work aggressively on weight loss.

## 2013-04-17 NOTE — Progress Notes (Signed)
  Subjective:    Patient ID: Sabrina Melton, female    DOB: 1951-07-22, 62 y.o.   MRN: [id number]  HPI The patient comes in today for followup of her obstructive sleep apnea.  She was started on CPAP at the automatic setting at the last visit, and has done very well with this.  She is very happy with her mask fit, and has been very compliant with CPAP by her download.  Her optimal pressure was found to be 12 cm, however she wishes to stay on the automatic setting.   Review of Systems  Constitutional: Negative for fever and unexpected weight change.  HENT: Negative for ear pain, nosebleeds, congestion, sore throat, rhinorrhea, sneezing, trouble swallowing, dental problem, postnasal drip and sinus pressure.   Eyes: Negative for redness and itching.  Respiratory: Negative for cough, chest tightness, shortness of breath and wheezing.   Cardiovascular: Negative for palpitations and leg swelling.  Gastrointestinal: Negative for nausea and vomiting.  Genitourinary: Negative for dysuria.  Musculoskeletal: Negative for joint swelling.  Skin: Negative for rash.  Neurological: Negative for headaches.  Hematological: Does not bruise/bleed easily.  Psychiatric/Behavioral: Negative for dysphoric mood. The patient is not nervous/anxious.        Objective:   Physical Exam Overweight female in no acute distress Nose without purulence or discharge noted No skin breakdown or pressure necrosis from CPAP mask Neck without lymphadenopathy or thyromegaly Lower extremities without edema, no cyanosis Alert and oriented, moves all 4 extremities.       Assessment & Plan:

## 2013-04-17 NOTE — Patient Instructions (Addendum)
Continue with cpap on the automatic setting. Keep up with mask cushion changes and supplies. Work on weight loss followup with me in 12mos, but call if having issues with your cpap.

## 2013-05-10 ENCOUNTER — Encounter: Payer: Self-pay | Admitting: Family Medicine

## 2013-05-10 ENCOUNTER — Other Ambulatory Visit: Payer: Self-pay | Admitting: Family Medicine

## 2013-05-10 NOTE — Progress Notes (Signed)
Sabrina Melton was seen by Dr. 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 OV note placed in scan box.

## 2013-05-10 NOTE — Telephone Encounter (Signed)
Ok to refill for 3 months. Needs follow up in September - morning appointment, come fasting.

## 2013-05-10 NOTE — Telephone Encounter (Signed)
Rx last filled 01/16/13 #30x3 rf.  Pls advise.

## 2013-05-13 NOTE — Telephone Encounter (Signed)
Rx called in to pharmacy.  Called and left a detailed message for pt to schedule f/u visit in September.

## 2013-06-06 ENCOUNTER — Other Ambulatory Visit: Payer: Self-pay | Admitting: Internal Medicine

## 2013-06-17 ENCOUNTER — Other Ambulatory Visit: Payer: Self-pay | Admitting: Family Medicine

## 2013-06-26 ENCOUNTER — Ambulatory Visit (INDEPENDENT_AMBULATORY_CARE_PROVIDER_SITE_OTHER): Payer: BC Managed Care – PPO | Admitting: Family Medicine

## 2013-06-26 ENCOUNTER — Encounter: Payer: Self-pay | Admitting: Family Medicine

## 2013-06-26 VITALS — BP 138/80 | Temp 98.6°F | Wt 200.0 lb

## 2013-06-26 DIAGNOSIS — I10 Essential (primary) hypertension: Secondary | ICD-10-CM

## 2013-06-26 DIAGNOSIS — R7309 Other abnormal glucose: Secondary | ICD-10-CM

## 2013-06-26 DIAGNOSIS — E039 Hypothyroidism, unspecified: Secondary | ICD-10-CM

## 2013-06-26 DIAGNOSIS — R011 Cardiac murmur, unspecified: Secondary | ICD-10-CM

## 2013-06-26 DIAGNOSIS — R7303 Prediabetes: Secondary | ICD-10-CM | POA: Insufficient documentation

## 2013-06-26 DIAGNOSIS — E785 Hyperlipidemia, unspecified: Secondary | ICD-10-CM

## 2013-06-26 DIAGNOSIS — Z23 Encounter for immunization: Secondary | ICD-10-CM

## 2013-06-26 DIAGNOSIS — F329 Major depressive disorder, single episode, unspecified: Secondary | ICD-10-CM

## 2013-06-26 LAB — LIPID PANEL
Cholesterol: 257 mg/dL — ABNORMAL HIGH (ref 0–200)
HDL: 86.4 mg/dL (ref >39.00)
Triglycerides: 380 mg/dL — ABNORMAL HIGH (ref 0.0–149.0)
VLDL: 76 mg/dL — ABNORMAL HIGH (ref 0.0–40.0)

## 2013-06-26 LAB — TSH: TSH: 15.15 u[IU]/mL — ABNORMAL HIGH (ref 0.35–5.50)

## 2013-06-26 LAB — T4, FREE: Free T4: 0.57 ng/dL — ABNORMAL LOW (ref 0.60–1.60)

## 2013-06-26 NOTE — Progress Notes (Signed)
Chief Complaint  Patient presents with  . 4 month follow up    HPI:  Follow up:  Prediabetes/HLD/HTN/Obesity/HLD: -advised diet and exercise -has appt with Dr. Tenny Craw in cardiology in October -denies:CP, SOB, DOE, swelling, palpitations -no regular exercise, diet poor  Depression: -on celexa 20mg  -doing well and wants to continue -no SI  OSA: -followed by Dr. Shelle Iron in pulm CPAP  S/p thyroid lobectomy in May: -on synthroid -feeling well  ROS: See pertinent positives and negatives per HPI.  Past Medical History  Diagnosis Date  . 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 Hodgkin disease     HISTORY OF  . Hypothyroidism   . Obstructive sleep apnea 05/21/09    USES CPAP  . Endometrial polyp   . LIVER FUNCTION TESTS, ABNORMAL, HX OF 01/30/2009    Qualifier: Diagnosis of  By: Tenny Craw, MD, Blenda Peals   . PONV (postoperative nausea and vomiting)   . Hypertension   . Depression   . GERD (gastroesophageal reflux disease)     Past Surgical History  Procedure Laterality Date  . Splenectomy, total  1977  . Tonsillectomy    . Hysteroscopy  040110    HYSTEROSCOPY,D&C FOR PMB AND ENDO POLYP  . Open reduction and internal fixation of tibial shaft fracture with ext into the plafond  03/29/07    Wilkie Aye, M.D. [facility])  . Dilation and curettage of uterus    . Colposcopy    . Gynecologic cryosurgery    . Thyroid lobectomy Left 03/14/2013    Dr Pollyann Kennedy  . Thyroidectomy N/A 03/14/2013    Procedure: LEFT THYROID LOBECTOMY WITH FROZEN SECTION;  Surgeon: Serena Colonel, MD;  Location: [facility] OR;  Service: ENT;  Laterality: N/A;    Family History  Problem Relation Age of Onset  . Breast cancer Maternal Grandmother   . Hyperlipidemia Father     History   Social History  . Marital Status: Married    Spouse Name: N/A    Number of Children: 1  . Years of Education: N/A   Occupational History  . Dental Assistant    Social History Main Topics  . Smoking status:  Former Smoker -- 0.25 packs/day for 40 years    Types: Cigarettes    Quit date: 03/02/2007  . Smokeless tobacco: Never Used  . Alcohol Use: Yes     Comment: 1-2 drink a few times per week  . Drug Use: No  . Sexual Activity: Yes    Birth Control/ Protection: Post-menopausal   Other Topics Concern  . None   Social History Narrative   Work or School: Restaurant manager, fast food Situation: lives with husband      Spiritual Beliefs: Christian      Lifestyle: no regular exercise, diet is poor             Current outpatient prescriptions:atenolol (TENORMIN) 25 MG tablet, TAKE 1 TABLET DAILY, Disp: 90 tablet, Rfl: 0;  citalopram (CELEXA) 20 MG tablet, TAKE 1 TABLET DAILY, Disp: 30 tablet, Rfl: 3;  fluticasone (FLONASE) 50 MCG/ACT nasal spray, Place 1 spray into the nose daily., Disp: 16 g, Rfl: 2;  levothyroxine (SYNTHROID, LEVOTHROID) 50 MCG tablet, Take 1 tablet (50 mcg total) by mouth daily., Disp: 30 tablet, Rfl: 5  EXAM:  Filed Vitals:   06/26/13 0827  BP: 138/80  Temp: 98.6 F (37 C)    Body mass index is 32.3 kg/(m^2).  GENERAL: vitals reviewed and listed above, alert, oriented, appears  well hydrated and in no acute distress  HEENT: atraumatic, conjunttiva clear, no obvious abnormalities on inspection of external nose and ears  NECK: no obvious masses on inspection  LUNGS: clear to auscultation bilaterally, no wheezes, rales or rhonchi, good air movement  CV: HRRR, SEM, no peripheral edema  MS: moves all extremities without noticeable abnormality  PSYCH: pleasant and cooperative, no obvious depression or anxiety  ASSESSMENT AND PLAN:  Discussed the following assessment and plan:  Need for prophylactic vaccination and inoculation against influenza - Plan: Flu Vaccine QUAD 36+ mos PF IM (Fluarix)  DEPRESSION  Other and unspecified hyperlipidemia - Plan: Lipid Panel  HYPERTENSION, UNSPECIFIED  Hypothyroidism - Plan: TSH, T4, Free  Prediabetes - Plan:  Hemoglobin A1c  Heart murmur  Morbid obesity  -FASTING LABS today -encouraged a healthy diet and regular exercise -continue current medications -flu vaccine given -following up for CPE at next visit  -Patient advised to return or notify a doctor immediately if symptoms worsen or persist or new concerns arise.  Patient Instructions  -We have ordered labs or studies at this visit. It can take up to 1-2 weeks for results and processing. We will contact you with instructions IF your results are abnormal. Normal results will be released to your [facility] If you have not heard from [place] or can not find your results in [facility] in 2 weeks please contact our office.   We recommend the following healthy lifestyle measures: - eat a healthy diet consisting of lots of vegetables, fruits, beans, nuts, seeds, healthy meats such as white chicken and fish and whole grains.  - avoid fried foods, fast food, processed foods, sodas, red meet and other fattening foods.  - get a least 150 minutes of aerobic exercise per week.   Follow up in: January for physical exam      Terressa Koyanagi.

## 2013-06-26 NOTE — Patient Instructions (Addendum)
-  We have ordered labs or studies at this visit. It can take up to 1-2 weeks for results and processing. We will contact you with instructions IF your results are abnormal. Normal results will be released to your [facility]. If you have not heard from [place] or can not find your results in [facility] in 2 weeks please contact our office.   We recommend the following healthy lifestyle measures: - eat a healthy diet consisting of lots of vegetables, fruits, beans, nuts, seeds, healthy meats such as white chicken and fish and whole grains.  - avoid fried foods, fast food, processed foods, sodas, red meet and other fattening foods.  - get a least 150 minutes of aerobic exercise per week.   Follow up in: January for physical exam

## 2013-07-02 MED ORDER — LEVOTHYROXINE SODIUM 75 MCG PO TABS: 75.0000 ug | ORAL_TABLET | Freq: Every day | ORAL | Status: DC

## 2013-07-02 NOTE — Addendum Note (Signed)
Addended by: Azucena Freed on: 07/02/2013 09:47 AM   Modules accepted: Orders, Medications

## 2013-07-02 NOTE — Progress Notes (Signed)
Quick Note:  Left a detailed message for pt at designated cell phone number. Rx sent to pharmacy. Labs released to [employer]. ______

## 2013-08-05 ENCOUNTER — Ambulatory Visit (INDEPENDENT_AMBULATORY_CARE_PROVIDER_SITE_OTHER): Payer: BC Managed Care – PPO | Admitting: Internal Medicine

## 2013-08-05 ENCOUNTER — Encounter: Payer: Self-pay | Admitting: Internal Medicine

## 2013-08-05 VITALS — BP 134/82 | HR 92 | Ht 66.0 in | Wt 205.0 lb

## 2013-08-05 DIAGNOSIS — R7309 Other abnormal glucose: Secondary | ICD-10-CM

## 2013-08-05 DIAGNOSIS — E785 Hyperlipidemia, unspecified: Secondary | ICD-10-CM

## 2013-08-05 DIAGNOSIS — R7303 Prediabetes: Secondary | ICD-10-CM

## 2013-08-05 DIAGNOSIS — R011 Cardiac murmur, unspecified: Secondary | ICD-10-CM

## 2013-08-05 MED ORDER — DILTIAZEM HCL ER 120 MG PO CP24: 120.0000 mg | ORAL_CAPSULE | Freq: Every day | ORAL | Status: DC

## 2013-08-05 NOTE — Progress Notes (Addendum)
HPI patient is a 62 year old with a history of dyspnea and palpitations.  .  I saw her back in Feb 2013.  Myoview in 2012 was normal. Since seen she complains of some fatigue and hair loss.  Breathing is OK  No CP  No signif palpitations. She wonders if atenolol explaining fatigue, hair loss No Known Allergies  Current Outpatient Prescriptions  Medication Sig Dispense Refill  . atenolol (TENORMIN) 25 MG tablet TAKE 1 TABLET DAILY  90 tablet  0  . citalopram (CELEXA) 20 MG tablet TAKE 1 TABLET DAILY  30 tablet  3  . fluticasone (FLONASE) 50 MCG/ACT nasal spray Place 1 spray into the nose daily.  16 g  2  . levothyroxine (SYNTHROID, LEVOTHROID) 75 MCG tablet Take 1 tablet (75 mcg total) by mouth daily.  90 tablet  1   No current facility-administered medications for this visit.    Past Medical History  Diagnosis Date  . 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 Hodgkin disease     HISTORY OF  . Hypothyroidism   . Obstructive sleep apnea 05/21/09    USES CPAP  . Endometrial polyp   . LIVER FUNCTION TESTS, ABNORMAL, HX OF 01/30/2009    Qualifier: Diagnosis of  By: Tenny Craw, MD, Blenda Peals   . PONV (postoperative nausea and vomiting)   . Hypertension   . Depression   . GERD (gastroesophageal reflux disease)     Past Surgical History  Procedure Laterality Date  . Splenectomy, total  1977  . Tonsillectomy    . Hysteroscopy  040110    HYSTEROSCOPY,D&C FOR PMB AND ENDO POLYP  . Open reduction and internal fixation of tibial shaft fracture with ext into the plafond  03/29/07    Wilkie Aye, M.D. [facility])  . Dilation and curettage of uterus    . Colposcopy    . Gynecologic cryosurgery    . Thyroid lobectomy Left 03/14/2013    Dr Pollyann Kennedy  . Thyroidectomy N/A 03/14/2013    Procedure: LEFT THYROID LOBECTOMY WITH FROZEN SECTION;  Surgeon: Serena Colonel, MD;  Location: [facility] OR;  Service: ENT;  Laterality: N/A;    Family History  Problem Relation Age of Onset  . Breast cancer Maternal  Grandmother   . Hyperlipidemia Father     History   Social History  . Marital Status: Married    Spouse Name: N/A    Number of Children: 1  . Years of Education: N/A   Occupational History  . Dental Assistant    Social History Main Topics  . Smoking status: Former Smoker -- 0.25 packs/day for 40 years    Types: Cigarettes    Quit date: 03/02/2007  . Smokeless tobacco: Never Used  . Alcohol Use: Yes     Comment: 1-2 drink a few times per week  . Drug Use: No  . Sexual Activity: Yes    Birth Control/ Protection: Post-menopausal   Other Topics Concern  . 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  They are negative to the above problem except as previously stated.  Vital Signs: BP 134/82  Pulse 92  Ht 5\' 6"  (1.676 m)  Wt 205  lb (92.987 kg)  BMI 33.1 kg/m2  Physical Exam [place] is in NAD HEENT:  Normocephalic, atraumatic. EOMI, PERRLA.  Neck: JVP is normal. No thyromegaly. No bruits.  Lungs: clear to auscultation. No rales no wheezes.  Heart: Regular rate and rhythm. Normal S1, S2. No S3.   II/VI systolic murmur LSB. PMI not displaced.  Abdomen:  Supple, nontender. Normal bowel sounds. No masses. No hepatomegaly.  Extremities:   Good distal pulses throughout. No lower extremity edema.  Musculoskeletal :moving all extremities.  Neuro:   alert and oriented x3.  CN II-XII grossly intact.  EKG:  SR. 92 bpm.    Assessment and Plan:  1.  Palpitations.  Will switch to diltiazem 120 mg and follow response  Stop atenolol  2.  Murmur  Will set up for echo.

## 2013-08-05 NOTE — Patient Instructions (Signed)
Your physician has requested that you have an echocardiogram. Echocardiography is a painless test that uses sound waves to create images of your heart. It provides your doctor with information about the size and shape of your heart and how well your heart's chambers and valves are working. This procedure takes approximately one hour. There are no restrictions for this procedure.  Your physician has recommended you make the following change in your medication:  Stop atenolol to see if helps rash Start diltiazem CD/ER 120 mg once daily  Your physician wants you to follow-up in: 1 year  You will receive a reminder letter in the mail two months in advance. If you don't receive a letter, please call our office to schedule the follow-up appointment.

## 2013-08-16 ENCOUNTER — Other Ambulatory Visit: Payer: Self-pay | Admitting: Internal Medicine

## 2013-08-28 ENCOUNTER — Ambulatory Visit (HOSPITAL_COMMUNITY): Payer: BC Managed Care – PPO | Attending: Cardiology | Admitting: Radiology

## 2013-08-28 ENCOUNTER — Encounter: Payer: Self-pay | Admitting: Cardiology

## 2013-08-28 DIAGNOSIS — G4733 Obstructive sleep apnea (adult) (pediatric): Secondary | ICD-10-CM | POA: Insufficient documentation

## 2013-08-28 DIAGNOSIS — I059 Rheumatic mitral valve disease, unspecified: Secondary | ICD-10-CM | POA: Insufficient documentation

## 2013-08-28 DIAGNOSIS — I10 Essential (primary) hypertension: Secondary | ICD-10-CM | POA: Insufficient documentation

## 2013-08-28 DIAGNOSIS — R011 Cardiac murmur, unspecified: Secondary | ICD-10-CM | POA: Insufficient documentation

## 2013-08-28 DIAGNOSIS — Z87891 Personal history of nicotine dependence: Secondary | ICD-10-CM | POA: Insufficient documentation

## 2013-08-28 DIAGNOSIS — R0609 Other forms of dyspnea: Secondary | ICD-10-CM | POA: Insufficient documentation

## 2013-08-28 DIAGNOSIS — E785 Hyperlipidemia, unspecified: Secondary | ICD-10-CM | POA: Insufficient documentation

## 2013-08-28 DIAGNOSIS — R0989 Other specified symptoms and signs involving the circulatory and respiratory systems: Secondary | ICD-10-CM | POA: Insufficient documentation

## 2013-08-28 DIAGNOSIS — E039 Hypothyroidism, unspecified: Secondary | ICD-10-CM | POA: Insufficient documentation

## 2013-08-28 DIAGNOSIS — I079 Rheumatic tricuspid valve disease, unspecified: Secondary | ICD-10-CM | POA: Insufficient documentation

## 2013-08-28 DIAGNOSIS — I359 Nonrheumatic aortic valve disorder, unspecified: Secondary | ICD-10-CM | POA: Insufficient documentation

## 2013-08-28 DIAGNOSIS — E669 Obesity, unspecified: Secondary | ICD-10-CM | POA: Insufficient documentation

## 2013-08-28 NOTE — Progress Notes (Signed)
Echocardiogram performed.  

## 2013-10-17 DIAGNOSIS — D219 Benign neoplasm of connective and other soft tissue, unspecified: Secondary | ICD-10-CM

## 2013-10-17 HISTORY — DX: Benign neoplasm of connective and other soft tissue, unspecified: D21.9

## 2013-10-18 ENCOUNTER — Other Ambulatory Visit: Payer: Self-pay | Admitting: Family Medicine

## 2013-10-23 ENCOUNTER — Encounter: Payer: BC Managed Care – PPO | Admitting: Family Medicine

## 2013-12-27 ENCOUNTER — Other Ambulatory Visit: Payer: Self-pay | Admitting: Family Medicine

## 2014-01-08 ENCOUNTER — Encounter: Payer: BC Managed Care – PPO | Admitting: Family Medicine

## 2014-02-03 ENCOUNTER — Other Ambulatory Visit: Payer: Self-pay | Admitting: Family Medicine

## 2014-03-05 ENCOUNTER — Encounter: Payer: Self-pay | Admitting: Family Medicine

## 2014-03-05 ENCOUNTER — Ambulatory Visit (INDEPENDENT_AMBULATORY_CARE_PROVIDER_SITE_OTHER): Payer: BC Managed Care – PPO | Admitting: Family Medicine

## 2014-03-05 VITALS — BP 130/80 | HR 81 | Temp 98.1°F | Ht 65.75 in | Wt 207.5 lb

## 2014-03-05 DIAGNOSIS — Z0000 Encounter for general adult medical examination without abnormal findings: Secondary | ICD-10-CM

## 2014-03-05 DIAGNOSIS — F329 Major depressive disorder, single episode, unspecified: Secondary | ICD-10-CM

## 2014-03-05 DIAGNOSIS — E039 Hypothyroidism, unspecified: Secondary | ICD-10-CM

## 2014-03-05 DIAGNOSIS — L989 Disorder of the skin and subcutaneous tissue, unspecified: Secondary | ICD-10-CM

## 2014-03-05 DIAGNOSIS — F3289 Other specified depressive episodes: Secondary | ICD-10-CM

## 2014-03-05 DIAGNOSIS — R7309 Other abnormal glucose: Secondary | ICD-10-CM

## 2014-03-05 DIAGNOSIS — M719 Bursopathy, unspecified: Secondary | ICD-10-CM

## 2014-03-05 DIAGNOSIS — R7303 Prediabetes: Secondary | ICD-10-CM

## 2014-03-05 DIAGNOSIS — G4733 Obstructive sleep apnea (adult) (pediatric): Secondary | ICD-10-CM

## 2014-03-05 DIAGNOSIS — I10 Essential (primary) hypertension: Secondary | ICD-10-CM

## 2014-03-05 DIAGNOSIS — M67919 Unspecified disorder of synovium and tendon, unspecified shoulder: Secondary | ICD-10-CM

## 2014-03-05 DIAGNOSIS — E785 Hyperlipidemia, unspecified: Secondary | ICD-10-CM

## 2014-03-05 LAB — BASIC METABOLIC PANEL
BUN: 14 mg/dL (ref 6–23)
CHLORIDE: 103 meq/L (ref 96–112)
CO2: 26 mEq/L (ref 19–32)
CREATININE: 0.7 mg/dL (ref 0.4–1.2)
Calcium: 9.1 mg/dL (ref 8.4–10.5)
GFR: 91.69 mL/min (ref >60.00)
GLUCOSE: 102 mg/dL — AB (ref 70–99)
POTASSIUM: 4.1 meq/L (ref 3.5–5.1)
Sodium: 137 mEq/L (ref 135–145)

## 2014-03-05 LAB — CBC WITH DIFFERENTIAL/PLATELET
BASOS PCT: 0.8 % (ref 0.0–3.0)
Basophils Absolute: 0.1 10*3/uL (ref 0.0–0.1)
EOS PCT: 4 % (ref 0.0–5.0)
Eosinophils Absolute: 0.3 10*3/uL (ref 0.0–0.7)
HEMATOCRIT: 39.1 % (ref 36.0–46.0)
Hemoglobin: 13.1 g/dL (ref 12.0–15.0)
Lymphocytes Relative: 23.5 % (ref 12.0–46.0)
Lymphs Abs: 2 10*3/uL (ref 0.7–4.0)
MCHC: 33.5 g/dL (ref 30.0–36.0)
MCV: 100.4 fl — ABNORMAL HIGH (ref 78.0–100.0)
MONO ABS: 0.8 10*3/uL (ref 0.1–1.0)
Monocytes Relative: 8.9 % (ref 3.0–12.0)
NEUTROS ABS: 5.3 10*3/uL (ref 1.4–7.7)
NEUTROS PCT: 62.8 % (ref 43.0–77.0)
Platelets: 335 10*3/uL (ref 150.0–400.0)
RBC: 3.89 Mil/uL (ref 3.87–5.11)
RDW: 14.1 % (ref 11.5–15.5)
WBC: 8.5 10*3/uL (ref 4.0–10.5)

## 2014-03-05 LAB — HEMOGLOBIN A1C: HEMOGLOBIN A1C: 6 % (ref 4.6–6.5)

## 2014-03-05 LAB — LIPID PANEL
Cholesterol: 261 mg/dL — ABNORMAL HIGH (ref 0–200)
HDL: 92.9 mg/dL (ref >39.00)
LDL CALC: 121 mg/dL — AB (ref 0–99)
Total CHOL/HDL Ratio: 3
Triglycerides: 236 mg/dL — ABNORMAL HIGH (ref 0.0–149.0)
VLDL: 47.2 mg/dL — AB (ref 0.0–40.0)

## 2014-03-05 LAB — TSH: TSH: 2.96 u[IU]/mL (ref 0.35–4.50)

## 2014-03-05 MED ORDER — CITALOPRAM HYDROBROMIDE 10 MG PO TABS: 10.0000 mg | ORAL_TABLET | Freq: Every day | ORAL | Status: DC

## 2014-03-05 NOTE — Patient Instructions (Signed)
WEAN OFF CELEXA:  10mg  daily for 1 week, then every other day for 1 week, then twice per week for 1 week, then stop  1000 IU Vit D3 daily; 1200mg  total from diet and supplement (you likely get half of this from your diet  We recommend the following healthy lifestyle measures: - eat a healthy diet consisting of lots of vegetables, fruits, beans, nuts, seeds, healthy meats such as white chicken and fish and whole grains.  - avoid fried foods, fast food, processed foods, sodas, red meet and other fattening foods.  - get a least 150 minutes of aerobic exercise per week.   Do the exercises for your shoulder and follow up in 4 weeks  -We placed a referral for you as discussed for removal of the mole - make sure to have this removed in the next month or so. It usually takes about 1-2 weeks to process and schedule this referral. If you have not heard from [place] regarding this appointment in 2 weeks please contact our office.  Follow up in 1 month

## 2014-03-05 NOTE — Progress Notes (Signed)
Pre visit review using our clinic review tool, if applicable. No additional management support is needed unless otherwise documented below in the visit note. 

## 2014-03-05 NOTE — Progress Notes (Signed)
No chief complaint on file.   HPI:  Here for CPE:  -Concerns today/follow up:  Prediabetes/HLD/HTN/Obesity:  -advised diet and exercise  -sees Dr. Harrington Challenger in cardiology in October  -denies:CP, SOB, DOE, swelling, palpitations  -walking 30 about 4 times per week, diet is so so  -takes diltiazem only  Depression:  -on celexa 20mg   -doing well and wants to continue  -no SI  -doing well and wants to taper off of this  OSA:  -followed by Dr. Gwenette Greet in pulm CPAP   S/p thyroid lobectomy in May 2014:  -on synthroid 70mcg  -feeling well - was supposed to follow up 3 months after last visit but did not  R Shoulder pain: -for several years -worse with certain activities, no trauma -denies: weakness, numbness, malaise, fever  -Vit D and Calcium: no  -Vaccines: UTD  -pap history: 12/13/11 - she is going to schedule with gyn  -FDLMP: N/A  -sexual activity: yes, female partner, no new partners  -wants STI testing: no  -FH breast, colon or ovarian ca: see FH -needs mammo, colon utd  -Alcohol, Tobacco, drug use: see social history  Review of Systems - Review of Systems  Constitutional: Negative for fever, weight loss and malaise/fatigue.  HENT: Negative for hearing loss.   Eyes: Negative for double vision.  Respiratory: Negative for cough and shortness of breath.   Cardiovascular: Negative for chest pain and leg swelling.  Gastrointestinal: Negative for vomiting, blood in stool and melena.  Genitourinary: Negative for dysuria and urgency.  Musculoskeletal: Negative for falls.  Skin: Negative for rash.  Neurological: Negative for dizziness, seizures and headaches.  Endo/Heme/Allergies: Does not bruise/bleed easily.  Psychiatric/Behavioral: Negative for depression and memory loss.     Past Medical History  Diagnosis Date  . 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 Hodgkin disease     HISTORY OF  . Hypothyroidism   . Obstructive sleep apnea 05/21/09    USES CPAP  .  Endometrial polyp   . LIVER FUNCTION TESTS, ABNORMAL, HX OF 01/30/2009    Qualifier: Diagnosis of  By: Harrington Challenger, MD, Shade Flood   . PONV (postoperative nausea and vomiting)   . Hypertension   . Depression   . GERD (gastroesophageal reflux disease)     Past Surgical History  Procedure Laterality Date  . Splenectomy, total  1977  . Tonsillectomy    . Hysteroscopy  040110    HYSTEROSCOPY,D&C FOR PMB AND ENDO POLYP  . Open reduction and internal fixation of tibial shaft fracture with ext into the plafond  03/29/07    Angelena Form, M.D. [facility])  . Dilation and curettage of uterus    . Colposcopy    . Gynecologic cryosurgery    . Thyroid lobectomy Left 03/14/2013    Dr Constance Holster  . Thyroidectomy N/A 03/14/2013    Procedure: LEFT THYROID LOBECTOMY WITH FROZEN SECTION;  Surgeon: Izora Gala, MD;  [facility] OR;  Service: ENT;  Laterality: N/A;    Family History  Problem Relation Age of Onset  . Breast cancer Maternal Grandmother   . Hyperlipidemia Father     History   Social History  . Marital Status: Married    Spouse Name: N/A    Number of Children: 1  . Years of Education: N/A   Occupational History  . Dental Assistant    Social History Main Topics  . Smoking status: Former Smoker -- 0.25 packs/day for 40 years    Types: Cigarettes    Quit date: 03/02/2007  .  Smokeless tobacco: Never Used  . Alcohol Use: Yes     Comment: 1-2 drink a few times per week  . Drug Use: No  . Sexual Activity: Yes    Birth Control/ Protection: Post-menopausal   Other Topics Concern  . None   Social History Narrative   Work or School: Personal assistant Situation: lives with husband      Spiritual Beliefs: Christian      Lifestyle: no regular exercise, diet is poor             Current outpatient prescriptions:diltiazem (DILACOR XR) 120 MG 24 hr capsule, Take 1 capsule (120 mg total) by mouth daily., Disp: 30 capsule, Rfl: 11;  fluticasone (FLONASE) 50 MCG/ACT  nasal spray, USE 1 SPRAY INTO THE NOSE DAILY., Disp: 16 g, Rfl: 2;  ibuprofen (ADVIL,MOTRIN) 200 MG tablet, Take 200 mg by mouth daily at 2 PM daily at 2 PM. 2 daily for shoulder pain, Disp: , Rfl:  levothyroxine (SYNTHROID, LEVOTHROID) 75 MCG tablet, TAKE 1 TABLET EVERY DAY, Disp: 90 tablet, Rfl: 1;  citalopram (CELEXA) 10 MG tablet, Take 1 tablet (10 mg total) by mouth daily., Disp: 30 tablet, Rfl: 1  EXAM:  Filed Vitals:   03/05/14 0816  BP: 130/80  Pulse: 81  Temp: 98.1 F (36.7 C)    GENERAL: vitals reviewed and listed below, alert, oriented, appears well hydrated and in no acute distress  HEENT: head atraumatic, PERRLA, normal appearance of eyes, ears, nose and mouth. moist mucus membranes.  NECK: supple, no masses or lymphadenopathy  LUNGS: clear to auscultation bilaterally, no rales, rhonchi or wheeze  CV: HRRR, no peripheral edema or cyanosis, normal pedal pulses  BREAST: does with gyn  ABDOMEN: bowel sounds normal, soft, non tender to palpation, no masses, no rebound or guarding  GU: does with gyn  RECTAL: refused  SKIN: no rash or - atypical bluish tinted and darker mole on L shoulder - small 81mm in diameter  MS: normal gait, moves all extremities normally - normal ROM, sesnation to light touch and strength in bilat upper exts, TTP R RTC insertion and pain with ext rot of R shoulder, + impingement test, neg empty can, neg shawl, NV intact distally  NEURO: CN II-XII grossly intact, normal muscle strength and sensation to light touch on extremities  PSYCH: normal affect, pleasant and cooperative  ASSESSMENT AND PLAN:  Discussed the following assessment and plan:  DEPRESSION - Plan: citalopram (CELEXA) 10 MG tablet -doing better and wants to try trial off medication - wean instructions provided  Other and unspecified hyperlipidemia  HYPERTENSION, UNSPECIFIED - Plan: Basic metabolic panel  Hypothyroidism - Plan: TSH  OBSTRUCTIVE SLEEP APNEA  Rotator cuff  disorder -HEP, follow up 4 weeks  Encounter for preventive health examination - Plan: CBC with Differential -Discussed and advised all [place] preventive services health task force level A and B recommendations for age, sex and risks. -Advised at least 150 minutes of exercise per week and a healthy diet low in saturated fats and sweets and consisting of fresh fruits and vegetables, lean meats such as fish and white chicken and whole grains. -FASTING labs, studies and vaccines per orders this encounter -she reports she will set up mammogram and get pap with gyn  Prediabetes - Plan: Hemoglobin A1c -diet and exercise advised  Hyperlipemia - Plan: Lipid panel -diet and exercise advised  Skin lesion of left arm - Plan: Ambulatory referral to Dermatology -likely vascular nevous but advised  removal, offered removal here but she prefesr to see dermatologist for this - referral placed    Orders Placed This Encounter  Procedures  . TSH  . CBC with Differential  . Basic metabolic panel  . Lipid panel  . Hemoglobin A1c  . Ambulatory referral to Dermatology    Referral Priority:  Routine    Referral Type:  Consultation    Referral Reason:  Specialty Services Required    Requested Specialty:  Dermatology    Number of Visits Requested:  1    Patient Instructions  WEAN OFF CELEXA:  10mg  daily for 1 week, then every other day for 1 week, then twice per week for 1 week, then stop  1000 IU Vit D3 daily; 1200mg  total from diet and supplement (you likely get half of this from your diet  We recommend the following healthy lifestyle measures: - eat a healthy diet consisting of lots of vegetables, fruits, beans, nuts, seeds, healthy meats such as white chicken and fish and whole grains.  - avoid fried foods, fast food, processed foods, sodas, red meet and other fattening foods.  - get a least 150 minutes of aerobic exercise per week.   Do the exercises for your shoulder and follow up in 4  weeks  -We placed a referral for you as discussed for removal of the mole - make sure to have this removed in the next month or so. It usually takes about 1-2 weeks to process and schedule this referral. If you have not heard from [place] regarding this appointment in 2 weeks please contact our office.  Follow up in 1 month        Patient advised to return to clinic immediately if symptoms worsen or persist or new concerns.    Return in about 1 month (around 04/05/2014) for follow up shoulder pain and mole.  Sabrina Melton

## 2014-04-23 ENCOUNTER — Encounter: Payer: Self-pay | Admitting: Pulmonary Disease

## 2014-04-23 ENCOUNTER — Encounter: Payer: Self-pay | Admitting: Family Medicine

## 2014-04-23 ENCOUNTER — Ambulatory Visit (INDEPENDENT_AMBULATORY_CARE_PROVIDER_SITE_OTHER): Payer: BC Managed Care – PPO | Admitting: Pulmonary Disease

## 2014-04-23 ENCOUNTER — Ambulatory Visit (INDEPENDENT_AMBULATORY_CARE_PROVIDER_SITE_OTHER): Payer: BC Managed Care – PPO | Admitting: Family Medicine

## 2014-04-23 VITALS — BP 122/80 | HR 81 | Temp 98.7°F | Ht 65.75 in | Wt 205.0 lb

## 2014-04-23 VITALS — BP 124/78 | HR 82 | Temp 97.8°F | Ht 66.0 in | Wt 205.0 lb

## 2014-04-23 DIAGNOSIS — F329 Major depressive disorder, single episode, unspecified: Secondary | ICD-10-CM

## 2014-04-23 DIAGNOSIS — F3289 Other specified depressive episodes: Secondary | ICD-10-CM

## 2014-04-23 DIAGNOSIS — M25519 Pain in unspecified shoulder: Secondary | ICD-10-CM

## 2014-04-23 DIAGNOSIS — M25511 Pain in right shoulder: Secondary | ICD-10-CM

## 2014-04-23 DIAGNOSIS — G4733 Obstructive sleep apnea (adult) (pediatric): Secondary | ICD-10-CM

## 2014-04-23 NOTE — Progress Notes (Signed)
Pre visit review using our clinic review tool, if applicable. No additional management support is needed unless otherwise documented below in the visit note. 

## 2014-04-23 NOTE — Assessment & Plan Note (Signed)
The patient is doing very well on CPAP with the automatic setting, and feels that her sleep and daytime alertness are more than adequate. I've asked her to keep up with her mask changes and supplies, and to work aggressively on weight loss. She will followup with me again in one year.

## 2014-04-23 NOTE — Progress Notes (Signed)
   Subjective:    Patient ID: Sabrina Melton, female    DOB: [birth year], 63 y.o.   MRN: [id number]  HPI The patient comes in today for followup of her obstructive sleep apnea. She is wearing CPAP compliantly, and is having no issues with her mask fit or pressure. She is sleeping well with the device, and is satisfied with her daytime alertness. Her weight is up a little bit from the last visit.   Review of Systems  Constitutional: Negative for fever and unexpected weight change.  HENT: Negative for congestion, dental problem, ear pain, nosebleeds, postnasal drip, rhinorrhea, sinus pressure, sneezing, sore throat and trouble swallowing.   Eyes: Negative for redness and itching.  Respiratory: Negative for cough, chest tightness, shortness of breath and wheezing.   Cardiovascular: Negative for palpitations and leg swelling.  Gastrointestinal: Negative for nausea and vomiting.  Genitourinary: Negative for dysuria.  Musculoskeletal: Negative for joint swelling.  Skin: Negative for rash.  Neurological: Negative for headaches.  Hematological: Does not bruise/bleed easily.  Psychiatric/Behavioral: Negative for dysphoric mood. The patient is not nervous/anxious.        Objective:   Physical Exam Overweight female in no acute distress Nose without purulence or discharge noted Neck without lymphadenopathy or thyromegaly No skin breakdown or pressure necrosis from the CPAP mask No significant edema or cyanosis in her lower extremities. Alert and oriented, does not appear to be sleepy, moves all 4 extremities.       Assessment & Plan:

## 2014-04-23 NOTE — Patient Instructions (Signed)
-  We placed a referral for you as discussed to the orthopedic doctor for your shoulder. It usually takes about 1-2 weeks to process and schedule this referral. If you have not heard from [place] regarding this appointment in 2 weeks please contact our office.  -reschedule your appointment with the dermatologist  -follow up in about 6 months

## 2014-04-23 NOTE — Progress Notes (Signed)
No chief complaint on file.   HPI:  Follow up:  Depression:   -was doing well and wanted to taper off celexa 02/2014  -reports: on 10mg  of celexa and wants to stay on this for a while -denies: depressed mood, thoughts of self harm  R Shoulder pain:  -for several years, reported to me last visit 1 month ago and treated with HEP and close follow up for presumed RTC disorder 02/2014 -now reports: has not done th exercises - hurts too bad, worse then it was -denies: weakness, numbness, malaise, fever   ROS: See pertinent positives and negatives per HPI.  Past Medical History  Diagnosis Date  . 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 Hodgkin disease     HISTORY OF  . Hypothyroidism   . Obstructive sleep apnea 05/21/09    USES CPAP  . Endometrial polyp   . LIVER FUNCTION TESTS, ABNORMAL, HX OF 01/30/2009    Qualifier: Diagnosis of  By: Harrington Challenger, MD, Shade Flood   . PONV (postoperative nausea and vomiting)   . Hypertension   . Depression   . GERD (gastroesophageal reflux disease)     Past Surgical History  Procedure Laterality Date  . Splenectomy, total  1977  . Tonsillectomy    . Hysteroscopy  040110    HYSTEROSCOPY,D&C FOR PMB AND ENDO POLYP  . Open reduction and internal fixation of tibial shaft fracture with ext into the plafond  03/29/07    Angelena Form, M.D. [facility])  . Dilation and curettage of uterus    . Colposcopy    . Gynecologic cryosurgery    . Thyroid lobectomy Left 03/14/2013    Dr Constance Holster  . Thyroidectomy N/A 03/14/2013    Procedure: LEFT THYROID LOBECTOMY WITH FROZEN SECTION;  Surgeon: Izora Gala, MD;  Location: [facility] OR;  Service: ENT;  Laterality: N/A;    Family History  Problem Relation Age of Onset  . Breast cancer Maternal Grandmother   . Hyperlipidemia Father     History   Social History  . Marital Status: Married    Spouse Name: N/A    Number of Children: 1  . Years of Education: N/A   Occupational History  . Dental Assistant     Social History Main Topics  . Smoking status: Former Smoker -- 0.25 packs/day for 40 years    Types: Cigarettes    Quit date: 03/02/2007  . Smokeless tobacco: Never Used  . Alcohol Use: Yes     Comment: 1-2 drink a few times per week  . Drug Use: No  . Sexual Activity: Yes    Birth Control/ Protection: Post-menopausal   Other Topics Concern  . 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 2 daily for shoulder pain, Disp: ,  Rfl: ;  levothyroxine (SYNTHROID, LEVOTHROID) 75 MCG tablet, TAKE 1 TABLET EVERY DAY, Disp: 90 tablet, Rfl: 1  EXAM:  Filed Vitals:   04/23/14 1025  BP: 122/80  Pulse: 81  Temp: 98.7 F (37.1 C)    Body mass index is 33.34 kg/(m^2).  GENERAL: vitals reviewed and listed above, alert, oriented, appears well hydrated and in no acute distress  HEENT: atraumatic, conjunttiva clear, no obvious abnormalities on inspection of external nose and ears  NECK: no obvious masses on inspection  LUNGS: clear to auscultation bilaterally, no wheezes, rales or rhonchi, good air movement  CV: HRRR, no peripheral edema  MS: moves all extremities without noticeable abnormality  PSYCH: pleasant and cooperative, no obvious depression or anxiety  ASSESSMENT AND PLAN:  Discussed the following assessment and plan:  Pain in joint, shoulder region, right - Plan:  Ambulatory referral to Orthopedic Surgery  DEPRESSION  -shoulder pain worsening, referral to orth -depression stable on 10mg  celexa and she wants to stay on this dose -advised derm appt for skin lesion -Patient advised to return or notify a doctor immediately if symptoms worsen or persist or new concerns arise.  Patient Instructions  -We placed a referral for you as discussed to the orthopedic doctor for your shoulder. It usually takes about 1-2 weeks to process and schedule this referral. If you have not heard from [place] regarding this appointment in 2 weeks please contact our office.  -reschedule your appointment with the dermatologist  -follow up in about 6 months      Vivaan Helseth R.

## 2014-04-23 NOTE — Patient Instructions (Signed)
Continue on cpap, and keep up with supplies. Work on weight reduction followup with me again in one year.

## 2014-05-09 ENCOUNTER — Encounter: Payer: Self-pay | Admitting: Gynecology

## 2014-05-09 ENCOUNTER — Ambulatory Visit (INDEPENDENT_AMBULATORY_CARE_PROVIDER_SITE_OTHER): Payer: BC Managed Care – PPO | Admitting: Gynecology

## 2014-05-09 DIAGNOSIS — N950 Postmenopausal bleeding: Secondary | ICD-10-CM

## 2014-05-09 NOTE — Patient Instructions (Signed)
Follow up for ultrasound as scheduled 

## 2014-05-09 NOTE — Progress Notes (Signed)
Sabrina Melton 09-16-1951 [id number]        63 y.o.  [id number] presents complaining of postmenopausal bleeding. Started over a year ago but was sporadic with an occasional spotting. Now since last week she's put daily with spotting to light flow. Some mild cramping. Does have a history of hysteroscopic resection endometrial polyp 2010 by Dr. Cherylann Banas.  Past medical history,surgical history, problem list, medications, allergies, family history and social history were all reviewed and documented in the EPIC chart.  Directed ROS with pertinent positives and negatives documented in the history of present illness/assessment and plan.  Exam: Sharrie Rothman assistant General appearance:  Normal Abdomen soft without masses guarding rebound or tenderness. Pelvic external BUS vagina with  mild atrophic changes. Slight dark staining. Cervix high in the vaginal vault with blood from os. Uterus grossly normal size midline mobile nontender. Adnexa without masses or tenderness.  Assessment/Plan:  63 y.o. G2P1011  postmenopausal bleeding for over a year. History of endometrial polyps in the past. Start with sonohysterogram. Fragile to include uterine cancer reviewed. Possibility for hysteroscopy D&C also discussed.   Note: This document was prepared with digital dictation and possible smart phrase technology. Any transcriptional errors that result from this process are unintentional.   Anastasio Auerbach MD, 4:11 PM 05/09/2014

## 2014-05-13 ENCOUNTER — Encounter: Payer: Self-pay | Admitting: Internal Medicine

## 2014-05-15 ENCOUNTER — Other Ambulatory Visit: Payer: Self-pay | Admitting: Gynecology

## 2014-05-15 DIAGNOSIS — N950 Postmenopausal bleeding: Secondary | ICD-10-CM

## 2014-05-27 ENCOUNTER — Telehealth: Payer: Self-pay | Admitting: Family Medicine

## 2014-05-27 NOTE — Telephone Encounter (Signed)
Patient Information:  Caller Name: [name]  Phone: [phone]  Patient: Sabrina Melton, Sabrina Melton  Gender: Female  DOB: 1951/05/08  Age: 63 Years  PCP: Maudie Mercury (TEXT 1st, after 20 mins can call), [facility] [place])  Office Follow Up:  Does the office need to follow up with this patient?: Yes  Instructions For The Office: Patient declines recommended appt. Patient is requesting a referral to Gastroenterologist/Dr. Earlean Shawl on [facility] in [place] at [phone]. Patient states a referral is required. Patient requests not to be referred to Erskine Emery. Patient can be reached at [phone]. Patient states she is aware that Dr. Maudie Mercury is out of the office until next week  RN Note:  Patient states she has had ongoing "gastrointestinal problems" X 6 weeks. Patient describes as increased belching, nausea and "stomach fullness" after eating. Patient states she is unable to eat normally due to sx. Patient states. " I have lost a few pounds." Patient states she has had intermittent vomiting after eating. No diarrhea. Patient states she is having daily "normal" brown bowel movements. Patient denies pain at present.  Care advice and diet advice given per guidelines. Patient advised to eat more frequent, smaller meals. Patient advised not to lie down for 2-3 hours after eating.  Patient declines recommended appt. Patient is requesting a referral to Gastroenterologist/Dr. Earlean Shawl on [facility] in [place] at [phone]. Patient states a referral is required. Patient requests not to be referred to Erskine Emery. Patient can be reached at [phone]. Patient states she is aware that Dr. Maudie Mercury is out of the office until next week. Call back parameters reviewed. Patient verbalizes understanding.  Symptoms  Reason For Call & Symptoms: Nausea, stomach "fullness"  Reviewed Health History In EMR: Yes  Reviewed Medications In EMR: Yes  Reviewed Allergies In EMR: Yes  Reviewed Surgeries /  Procedures: Yes  Date of Onset of Symptoms: Unknown  Treatments Tried: TUMS  Treatments Tried Worked: No  Guideline(s) Used:  Vomiting  Abdominal Pain - Female  Abdominal Pain - Upper  Disposition Per Guideline:   See Today in Office  Reason For Disposition Reached:   Age > 60 years  Advice Given:  Call Back If:  You become worse.  Antacid:  If having pain now, try taking an antacid (e.g., Mylanta, Maalox). Dose: 2 tablespoons (30 ml) of liquid by mouth.  Reducing Reflux Symptoms (GERD):  Eat smaller meals and avoid snacks for 2 hours before sleeping. Avoid the following foods, which tend to aggravate heartburn and stomach problems: fatty/greasy foods, spicy foods, caffeinated beverages, mints, and chocolate.  Expected Course:  With harmless causes, the pain usually lessens or is resolved in 2 hours. With gastroenteritis, stomach cramps may precede each bout of vomiting or diarrhea. With serious causes (such as appendicitis), the pain becomes constant and severe.  Call Back If:  Abdominal pain is constant and present for more than 2 hours.  You become worse.  Patient Refused Recommendation:  Patient Refused Care Advice  Patient declines recommended appt. Patient is requesting a referral to Gastroenterologist/Dr. Earlean Shawl on [facility] in [place] at [phone]. Patient states a referral is required. Patient requests not to be referred to Erskine Emery. Patient can be reached at [phone]. Patient states she is aware that Dr. Maudie Mercury is out of the office until next week

## 2014-05-27 NOTE — Telephone Encounter (Signed)
Follow the CAN advice but she will have to wait for Dr. Maudie Mercury to do this referral next week

## 2014-05-28 NOTE — Telephone Encounter (Signed)
I left a message at the pts home and cell number to return my call.

## 2014-05-28 NOTE — Telephone Encounter (Signed)
Patient informed. Message forwarded to Dr Maudie Mercury.

## 2014-05-30 NOTE — Telephone Encounter (Signed)
ok 

## 2014-06-04 ENCOUNTER — Ambulatory Visit (INDEPENDENT_AMBULATORY_CARE_PROVIDER_SITE_OTHER): Payer: BC Managed Care – PPO

## 2014-06-04 ENCOUNTER — Ambulatory Visit (INDEPENDENT_AMBULATORY_CARE_PROVIDER_SITE_OTHER): Payer: BC Managed Care – PPO | Admitting: Gynecology

## 2014-06-04 ENCOUNTER — Encounter: Payer: Self-pay | Admitting: Gynecology

## 2014-06-04 ENCOUNTER — Other Ambulatory Visit: Payer: Self-pay | Admitting: Gynecology

## 2014-06-04 DIAGNOSIS — D259 Leiomyoma of uterus, unspecified: Secondary | ICD-10-CM

## 2014-06-04 DIAGNOSIS — R9389 Abnormal findings on diagnostic imaging of other specified body structures: Secondary | ICD-10-CM

## 2014-06-04 DIAGNOSIS — N83339 Acquired atrophy of ovary and fallopian tube, unspecified side: Secondary | ICD-10-CM

## 2014-06-04 DIAGNOSIS — N949 Unspecified condition associated with female genital organs and menstrual cycle: Secondary | ICD-10-CM

## 2014-06-04 DIAGNOSIS — D251 Intramural leiomyoma of uterus: Secondary | ICD-10-CM

## 2014-06-04 DIAGNOSIS — N950 Postmenopausal bleeding: Secondary | ICD-10-CM

## 2014-06-04 DIAGNOSIS — N840 Polyp of corpus uteri: Secondary | ICD-10-CM

## 2014-06-04 DIAGNOSIS — R102 Pelvic and perineal pain: Secondary | ICD-10-CM

## 2014-06-04 MED ORDER — LIDOCAINE HCL 1 % IJ SOLN
8.0000 mL | Freq: Once | INTRAMUSCULAR | Status: AC
  Administered 2014-06-04: 8 mL

## 2014-06-04 NOTE — Patient Instructions (Signed)
Followup for surgery as arranged by office.

## 2014-06-04 NOTE — Progress Notes (Signed)
Sabrina Melton 1951-10-12 [id number]        63 y.o.  G2P1011 presents for sonohysterogram. Patient has been having postmenopausal bleeding for over a year getting it to be sporadic with occasional spotting. Starting mid July she had been bleeding daily with spotting to a light flow and mild cramping. She did have a history of hysteroscopic resection of endometrial polyp 2000 and by Dr. Cherylann Banas.  Past medical history,surgical history, problem list, medications, allergies, family history and social history were all reviewed and documented in the EPIC chart.  Directed ROS with pertinent positives and negatives documented in the history of present illness/assessment and plan.  Exam: [place] assistant General appearance:  Normal Pelvic external BUS vagina with atrophic changes. Cervix flush with the upper vagina. Uterus grossly normal size midline mobile difficult to palpate due to abdominal girth. Adnexa without masses or tenderness  Ultrasound: The uterus normal to small in size. Endometrial echo 17 mm with positive vascular flow. 3 measurable small myomas the largest measuring 24 mm. Right ovary not visualized. Left ovary normal. Cul-de-sac negative.  Sonohysterogram: The cervix was cleansed with Betadine and initial attempts to place a catheter were unsuccessful due to cervical stenosis. A paracervical block using 1% lidocaine 8 cc total was placed, anterior lip grasped with a single-tooth tenaculum and using a disposable gradated dilator the cervix was gently dilated and subsequently the sonohysterogram catheter was placed without difficulty. There was good distention with the cavity filled with at least one large possibly 2 polyps. Endometrial sample was taken. Patient tolerated well.  Assessment/Plan:  63 y.o. G2P1011 with prolonged postmenopausal bleeding and large endometrial polyp. Reviewed situation with the patient my recommendation to proceed with hysteroscopy D&C, resection of  endometrial polyp. I reviewed what is involved with the procedure to include the expected intraoperative course and instrumentation, postoperative course and recovery period. Possible pathology results up to and including endometrial cancer reviewed. Risks of infection, prolonged antibiotics, hemorrhage necessitating transfusion the risks of transfusion including transfusion reaction, hepatitis, HIV, mad cow disease and other unknown entities were reviewed. The risk of uterine perforation and damage to internal organs including bowel, bladder, and ureters, vessels, nerves necessitating major exploratory repair surgeries in the future repair surgeries including ostomy formation, bowel resection, bladder repair and ureteral damage repair was all discussed with her. Distending media absorption leading to metabolic complication such as fluid overload, coma and seizures were reviewed. The patient's questions were answered and she is ready to proceed with surgery.    I subsequently did a general exam so that she would not need to return for a preoperative consult to include:  HEENT normal Lungs clear Cardiac with pansystolic 3-8/7 harsh murmur Abdomen soft nontender without masses guarding rebound Pelvic as outlined above  I discussed her murmur with her and she was evaluated by Dr. Dorris Carnes and had a 3-D echo one year ago where she showed mild mitral regurgitation and mild tricuspid regurgitation but normal ejection fractions. No specific recommendations were made to include no antibiotic prophylaxis for procedures.   Note: This document was prepared with digital dictation and possible smart phrase technology. Any transcriptional errors that result from this process are unintentional.   Anastasio Auerbach MD, 4:59 PM 06/04/2014

## 2014-06-05 ENCOUNTER — Telehealth: Payer: Self-pay | Admitting: Family Medicine

## 2014-06-05 DIAGNOSIS — R112 Nausea with vomiting, unspecified: Secondary | ICD-10-CM

## 2014-06-05 NOTE — Telephone Encounter (Signed)
Can you find out what this if for - usually would advise she see [place] first so that we know what we are referring for.

## 2014-06-05 NOTE — Telephone Encounter (Signed)
Pt is requesting a referral to see a GI   Reason; pt said she is having digestive problems   Doctor; Dr Richmond Campbell  Phone number; [phone]

## 2014-06-06 NOTE — Telephone Encounter (Signed)
I left a message at the pts home and cell number to return my call.

## 2014-06-06 NOTE — Telephone Encounter (Signed)
Advise appt. Happy to refer as well if she wishes (ok to place)- but needs to see use for cough.

## 2014-06-06 NOTE — Telephone Encounter (Signed)
Referral entered and the pt is aware to expect a call with an appt.

## 2014-06-06 NOTE — Telephone Encounter (Signed)
I spoke with the pt and she complains of feeling full after eating small meals for the past month, coughing with mucous and no color noted as she spat this into a napkin, nausea and vomiting intermittently and states "she does not feel right and feels different". Also stated she has lost 5lbs already-please advise.  Message forwarded to Dr Maudie Mercury.

## 2014-06-11 ENCOUNTER — Other Ambulatory Visit: Payer: Self-pay | Admitting: Gynecology

## 2014-06-11 ENCOUNTER — Telehealth: Payer: Self-pay

## 2014-06-11 MED ORDER — MISOPROSTOL 200 MCG PO TABS: ORAL_TABLET | ORAL | Status: DC

## 2014-06-11 NOTE — Telephone Encounter (Signed)
Patient returned my call today and confirmed her surgery date for 07/09/14 at 1pm at [facility].  I advised her Dr. Loetta Rough recommended a Cytotec Tab vaginally hs before surgery and that was sent to her pharmacy.

## 2014-06-13 ENCOUNTER — Encounter: Payer: Self-pay | Admitting: Family Medicine

## 2014-06-17 ENCOUNTER — Telehealth: Payer: Self-pay | Admitting: Family Medicine

## 2014-06-17 NOTE — Telephone Encounter (Signed)
I thought you had already placed this referral? Can you check and make sure you made it to Dr. Earlean Shawl? Thanks.

## 2014-06-17 NOTE — Telephone Encounter (Signed)
I called the pt and she stated she was referred to Dr Earlean Shawl but when she called their office they told her the doctor had to review the records first and she did not understand why this needed to be done.  I explained to her this is their office policy and since she has seen another GI doc before (Dr Deatra Ina) and she agreed as she was not aware.  I also advised her once they give their approval to see her to call their office every few days to see if they have any cancellations to see if she could be seen sooner and she agreed.

## 2014-06-17 NOTE — Telephone Encounter (Signed)
Pt would like another referral to see another GI specialist. Dr Earlean Shawl must review her chart and he is scheduling into oct. Pt does not want to go to  GI. Pt has bcbs

## 2014-06-27 ENCOUNTER — Encounter (HOSPITAL_COMMUNITY): Payer: Self-pay

## 2014-06-29 ENCOUNTER — Other Ambulatory Visit: Payer: Self-pay | Admitting: Family Medicine

## 2014-07-07 NOTE — H&P (Signed)
  Sabrina Melton 10/20/1950 [id number]   History and Physical  Chief complaint: postmenopausal bleeding, endometrial polyp  History of present illness: 63 y.o. G2P1011 with a history of postmenopausal bleeding for over a year on and off, white to heavy.  Sonohysterogram performed which shows a large endometrial polyp. Endometrial biopsy showed benign weakly proliferative endometrium.  Patient is admitted for hysteroscopy D&C, resection of her endometrial polyp.  Past medical history,surgical history, medications, allergies, family history and social history were all reviewed and documented in the EPIC chart.  ROS:  Was performed and pertinent positives and negatives are included in the history of present illness.  Exam:  [place] assistant 06/04/2014 General: well developed, well nourished female, no acute distress HEENT: normal  Lungs: clear to auscultation without wheezing, rales or rhonchi  Cardiac: regular rate with pansystolic 3-2/3 harsh murmur  Abdomen: soft, nontender without masses, guarding, rebound, organomegaly  Pelvic: external bus vagina: with atrophic changes   Cervix: flush with upper vagina. Uterus: difficult to palpate due to abdominal girth but grossly normal in size midline mobile nontender Adnexa: without gross masses or tenderness    Assessment/Plan:  63 y.o. G2P1011 with prolonged postmenopausal bleeding and large endometrial polyp. Reviewed situation with the patient my recommendation to proceed with hysteroscopy D&C, resection of endometrial polyp. I reviewed what is involved with the procedure to include the expected intraoperative course and instrumentation, postoperative course and recovery period. Possible pathology results up to and including endometrial cancer reviewed. Risks of infection, prolonged antibiotics, hemorrhage necessitating transfusion the risks of transfusion including transfusion reaction, hepatitis, HIV, mad cow disease and other unknown  entities were reviewed. The risk of uterine perforation and damage to internal organs including bowel, bladder, and ureters, vessels, nerves necessitating major exploratory repair surgeries and future repair surgeries including ostomy formation, bowel resection, bladder repair and ureteral damage repair was all discussed with her. Distending media absorption leading to metabolic complication such as fluid overload, coma and seizures were reviewed. The patient's questions were answered and she is ready to proceed with surgery.  I discussed her murmur with her and she was evaluated by Dr. Dorris Carnes and had a 3-D echo one year ago where she showed mild mitral regurgitation and mild tricuspid regurgitation but normal ejection fractions. No specific recommendations were made to include no antibiotic prophylaxis for procedures.     Anastasio Auerbach MD, 9:24 AM 07/07/2014

## 2014-07-08 ENCOUNTER — Encounter (HOSPITAL_COMMUNITY): Payer: Self-pay

## 2014-07-08 ENCOUNTER — Encounter (HOSPITAL_COMMUNITY)
Admission: RE | Admit: 2014-07-08 | Discharge: 2014-07-08 | Disposition: A | Discharge status: Home / Self Care | Payer: BC Managed Care – PPO | Source: Ambulatory Visit | Attending: Gynecology | Admitting: Gynecology

## 2014-07-08 DIAGNOSIS — E039 Hypothyroidism, unspecified: Secondary | ICD-10-CM | POA: Diagnosis not present

## 2014-07-08 DIAGNOSIS — N950 Postmenopausal bleeding: Secondary | ICD-10-CM | POA: Diagnosis not present

## 2014-07-08 DIAGNOSIS — R011 Cardiac murmur, unspecified: Secondary | ICD-10-CM | POA: Diagnosis not present

## 2014-07-08 DIAGNOSIS — I10 Essential (primary) hypertension: Secondary | ICD-10-CM | POA: Diagnosis not present

## 2014-07-08 DIAGNOSIS — Z87891 Personal history of nicotine dependence: Secondary | ICD-10-CM | POA: Diagnosis not present

## 2014-07-08 DIAGNOSIS — D250 Submucous leiomyoma of uterus: Secondary | ICD-10-CM | POA: Diagnosis not present

## 2014-07-08 DIAGNOSIS — K219 Gastro-esophageal reflux disease without esophagitis: Secondary | ICD-10-CM | POA: Diagnosis not present

## 2014-07-08 DIAGNOSIS — G4733 Obstructive sleep apnea (adult) (pediatric): Secondary | ICD-10-CM | POA: Diagnosis not present

## 2014-07-08 DIAGNOSIS — N840 Polyp of corpus uteri: Secondary | ICD-10-CM | POA: Diagnosis not present

## 2014-07-08 LAB — COMPREHENSIVE METABOLIC PANEL
ALBUMIN: 3.8 g/dL (ref 3.5–5.2)
ALK PHOS: 88 U/L (ref 39–117)
ALT: 58 U/L — AB (ref 0–35)
AST: 50 U/L — AB (ref 0–37)
Anion gap: 15 (ref 5–15)
BUN: 10 mg/dL (ref 6–23)
CO2: 25 mEq/L (ref 19–32)
Calcium: 9.6 mg/dL (ref 8.4–10.5)
Chloride: 99 mEq/L (ref 96–112)
Creatinine, Ser: 0.9 mg/dL (ref 0.50–1.10)
GFR calc Af Amer: 78 mL/min — ABNORMAL LOW (ref >90)
GFR calc non Af Amer: 67 mL/min — ABNORMAL LOW (ref >90)
Glucose, Bld: 97 mg/dL (ref 70–99)
POTASSIUM: 4.5 meq/L (ref 3.7–5.3)
SODIUM: 139 meq/L (ref 137–147)
TOTAL PROTEIN: 7.4 g/dL (ref 6.0–8.3)
Total Bilirubin: 0.4 mg/dL (ref 0.3–1.2)

## 2014-07-08 LAB — CBC
HCT: 42 % (ref 36.0–46.0)
Hemoglobin: 14 g/dL (ref 12.0–15.0)
MCH: 34 pg (ref 26.0–34.0)
MCHC: 33.3 g/dL (ref 30.0–36.0)
MCV: 101.9 fL — ABNORMAL HIGH (ref 78.0–100.0)
Platelets: 390 10*3/uL (ref 150–400)
RBC: 4.12 MIL/uL (ref 3.87–5.11)
RDW: 13.9 % (ref 11.5–15.5)
WBC: 10.7 10*3/uL — ABNORMAL HIGH (ref 4.0–10.5)

## 2014-07-08 MED ORDER — DEXTROSE 5 % IV SOLN
2.0000 g | INTRAVENOUS | Status: AC
  Administered 2014-07-09: 2 g via INTRAVENOUS
  Filled 2014-07-08: qty 2

## 2014-07-08 NOTE — Patient Instructions (Addendum)
   Your procedure is scheduled on: SEPT 23 ([place] through the Main Entrance of [facility] at: SEPT 23 AT 1130 Pick up the phone at the desk and dial [phone] and inform us of your arrival.  Please call this number if you have any problems the morning of surgery: [phone]  Remember: Do not eat food after midnight: SEPT 53 Do not drink clear liquids after: 9AM SEPT 23 Take these medicines the morning of surgery with a SIP OF WATER: TAKE PRILOSEC DAY OF SURGERY ALONG WITH BLOOD PRESSURE   Do not wear jewelry, make-up, or FINGER nail polish No metal in your hair or on your body. Do not wear lotions, powders, perfumes.  You may wear deodorant.  Do not bring valuables to the hospital. Contacts, dentures or bridgework may not be worn into surgery.  Leave suitcase in the car. After Surgery it may be brought to your room. For patients being admitted to the hospital, checkout time is 11:00am the day of discharge.    Patients discharged on the day of surgery will not be allowed to drive home.

## 2014-07-09 ENCOUNTER — Encounter: Payer: Self-pay | Admitting: Gynecology

## 2014-07-09 ENCOUNTER — Encounter (HOSPITAL_COMMUNITY): Payer: BC Managed Care – PPO | Admitting: Anesthesiology

## 2014-07-09 ENCOUNTER — Ambulatory Visit (HOSPITAL_COMMUNITY)
Admission: RE | Admit: 2014-07-09 | Discharge: 2014-07-09 | Disposition: A | Discharge status: Home / Self Care | Payer: BC Managed Care – PPO | Source: Ambulatory Visit | Attending: Gynecology | Admitting: Gynecology

## 2014-07-09 ENCOUNTER — Encounter (HOSPITAL_COMMUNITY): Payer: Self-pay | Admitting: *Deleted

## 2014-07-09 ENCOUNTER — Ambulatory Visit (HOSPITAL_COMMUNITY): Payer: BC Managed Care – PPO | Admitting: Anesthesiology

## 2014-07-09 ENCOUNTER — Encounter (HOSPITAL_COMMUNITY)
Admission: RE | Disposition: A | Discharge status: Home / Self Care | Payer: Self-pay | Source: Ambulatory Visit | Attending: Gynecology

## 2014-07-09 DIAGNOSIS — E039 Hypothyroidism, unspecified: Secondary | ICD-10-CM | POA: Insufficient documentation

## 2014-07-09 DIAGNOSIS — K219 Gastro-esophageal reflux disease without esophagitis: Secondary | ICD-10-CM | POA: Insufficient documentation

## 2014-07-09 DIAGNOSIS — R011 Cardiac murmur, unspecified: Secondary | ICD-10-CM | POA: Insufficient documentation

## 2014-07-09 DIAGNOSIS — G4733 Obstructive sleep apnea (adult) (pediatric): Secondary | ICD-10-CM | POA: Insufficient documentation

## 2014-07-09 DIAGNOSIS — N840 Polyp of corpus uteri: Secondary | ICD-10-CM | POA: Insufficient documentation

## 2014-07-09 DIAGNOSIS — N950 Postmenopausal bleeding: Secondary | ICD-10-CM | POA: Diagnosis not present

## 2014-07-09 DIAGNOSIS — I10 Essential (primary) hypertension: Secondary | ICD-10-CM | POA: Insufficient documentation

## 2014-07-09 DIAGNOSIS — D250 Submucous leiomyoma of uterus: Secondary | ICD-10-CM | POA: Insufficient documentation

## 2014-07-09 DIAGNOSIS — Z87891 Personal history of nicotine dependence: Secondary | ICD-10-CM | POA: Insufficient documentation

## 2014-07-09 DIAGNOSIS — D251 Intramural leiomyoma of uterus: Secondary | ICD-10-CM

## 2014-07-09 SURGERY — DILATATION & CURETTAGE/HYSTEROSCOPY WITH MYOSURE
Anesthesia: General | Site: Uterus

## 2014-07-09 MED ORDER — PROPOFOL 10 MG/ML IV BOLUS
INTRAVENOUS | Status: DC | PRN
  Administered 2014-07-09: 50 mg via INTRAVENOUS
  Administered 2014-07-09: 100 mg via INTRAVENOUS
  Administered 2014-07-09: 150 mg via INTRAVENOUS

## 2014-07-09 MED ORDER — LIDOCAINE HCL 1 % IJ SOLN
INTRAMUSCULAR | Status: AC
  Filled 2014-07-09: qty 20

## 2014-07-09 MED ORDER — LIDOCAINE HCL (CARDIAC) 20 MG/ML IV SOLN
INTRAVENOUS | Status: AC
  Filled 2014-07-09: qty 5

## 2014-07-09 MED ORDER — DEXAMETHASONE SODIUM PHOSPHATE 10 MG/ML IJ SOLN
INTRAMUSCULAR | Status: DC | PRN
  Administered 2014-07-09: 4 mg via INTRAVENOUS

## 2014-07-09 MED ORDER — ONDANSETRON HCL 4 MG/2ML IJ SOLN
INTRAMUSCULAR | Status: DC | PRN
  Administered 2014-07-09: 4 mg via INTRAVENOUS

## 2014-07-09 MED ORDER — FENTANYL CITRATE 0.05 MG/ML IJ SOLN
INTRAMUSCULAR | Status: AC
  Filled 2014-07-09: qty 2

## 2014-07-09 MED ORDER — PHENYLEPHRINE 40 MCG/ML (10ML) SYRINGE FOR IV PUSH (FOR BLOOD PRESSURE SUPPORT)
PREFILLED_SYRINGE | INTRAVENOUS | Status: AC
  Filled 2014-07-09: qty 5

## 2014-07-09 MED ORDER — SUCCINYLCHOLINE CHLORIDE 20 MG/ML IJ SOLN
INTRAMUSCULAR | Status: AC
  Filled 2014-07-09: qty 10

## 2014-07-09 MED ORDER — ONDANSETRON HCL 4 MG/2ML IJ SOLN
INTRAMUSCULAR | Status: AC
  Filled 2014-07-09: qty 2

## 2014-07-09 MED ORDER — OXYCODONE-ACETAMINOPHEN 5-325 MG PO TABS: 1.0000 | ORAL_TABLET | ORAL | Status: DC | PRN

## 2014-07-09 MED ORDER — SUCCINYLCHOLINE CHLORIDE 20 MG/ML IJ SOLN
INTRAMUSCULAR | Status: DC | PRN
  Administered 2014-07-09: 140 mg via INTRAVENOUS
  Administered 2014-07-09: 10 mg via INTRAVENOUS

## 2014-07-09 MED ORDER — LIDOCAINE HCL 1 % IJ SOLN
INTRAMUSCULAR | Status: DC | PRN
  Administered 2014-07-09: 10 mL

## 2014-07-09 MED ORDER — LACTATED RINGERS IV SOLN
INTRAVENOUS | Status: DC
  Administered 2014-07-09 (×2): via INTRAVENOUS

## 2014-07-09 MED ORDER — SCOPOLAMINE 1 MG/3DAYS TD PT72
MEDICATED_PATCH | TRANSDERMAL | Status: AC
  Administered 2014-07-09: 1.5 mg via TRANSDERMAL
  Filled 2014-07-09: qty 1

## 2014-07-09 MED ORDER — SCOPOLAMINE 1 MG/3DAYS TD PT72
1.0000 | MEDICATED_PATCH | Freq: Once | TRANSDERMAL | Status: DC
  Administered 2014-07-09: 1.5 mg via TRANSDERMAL

## 2014-07-09 MED ORDER — MIDAZOLAM HCL 2 MG/2ML IJ SOLN
INTRAMUSCULAR | Status: AC
  Filled 2014-07-09: qty 2

## 2014-07-09 MED ORDER — KETOROLAC TROMETHAMINE 30 MG/ML IJ SOLN
INTRAMUSCULAR | Status: DC | PRN
  Administered 2014-07-09: 30 mg via INTRAVENOUS

## 2014-07-09 MED ORDER — PROPOFOL 10 MG/ML IV EMUL
INTRAVENOUS | Status: AC
  Filled 2014-07-09: qty 20

## 2014-07-09 MED ORDER — PHENYLEPHRINE HCL 10 MG/ML IJ SOLN
INTRAMUSCULAR | Status: DC | PRN
  Administered 2014-07-09 (×2): 40 ug via INTRAVENOUS

## 2014-07-09 MED ORDER — DEXAMETHASONE SODIUM PHOSPHATE 10 MG/ML IJ SOLN
INTRAMUSCULAR | Status: AC
  Filled 2014-07-09: qty 1

## 2014-07-09 MED ORDER — DEXMEDETOMIDINE HCL 200 MCG/2ML IV SOLN
INTRAVENOUS | Status: DC | PRN
  Administered 2014-07-09 (×3): 4 ug via INTRAVENOUS

## 2014-07-09 MED ORDER — LIDOCAINE HCL (CARDIAC) 20 MG/ML IV SOLN
INTRAVENOUS | Status: DC | PRN
  Administered 2014-07-09: 80 mg via INTRAVENOUS
  Administered 2014-07-09: 100 mg via INTRATRACHEAL

## 2014-07-09 MED ORDER — MIDAZOLAM HCL 2 MG/2ML IJ SOLN
INTRAMUSCULAR | Status: DC | PRN
  Administered 2014-07-09: 2 mg via INTRAVENOUS

## 2014-07-09 MED ORDER — SODIUM CHLORIDE 0.9 % IR SOLN
Status: DC | PRN
  Administered 2014-07-09: 3000 mL

## 2014-07-09 MED ORDER — FENTANYL CITRATE 0.05 MG/ML IJ SOLN
INTRAMUSCULAR | Status: DC | PRN
  Administered 2014-07-09: 50 ug via INTRAVENOUS

## 2014-07-09 MED ORDER — ACETAMINOPHEN 160 MG/5ML PO SOLN: 650.0000 mg | Freq: Four times a day (QID) | ORAL | Status: DC | PRN

## 2014-07-09 SURGICAL SUPPLY — 18 items
CATH ROBINSON RED A/P 16FR (CATHETERS) IMPLANT
CLOTH BEACON ORANGE TIMEOUT ST (SAFETY) ×3 IMPLANT
CONTAINER PREFILL 10% NBF 60ML (FORM) ×6 IMPLANT
DEVICE MYOSURE CLASSIC (MISCELLANEOUS) IMPLANT
DEVICE MYOSURE LITE (MISCELLANEOUS) IMPLANT
DRAPE HYSTEROSCOPY (DRAPE) ×3 IMPLANT
FILTER ARTHROSCOPY CONVERTOR (FILTER) ×3 IMPLANT
GLOVE BIO SURGEON STRL SZ7 (GLOVE) ×6 IMPLANT
GOWN STRL REUS W/TWL LRG LVL3 (GOWN DISPOSABLE) ×6 IMPLANT
MYOSURE XL FIBROID REM (MISCELLANEOUS) ×3
PACK VAGINAL MINOR WOMEN LF (CUSTOM PROCEDURE TRAY) ×3 IMPLANT
PAD OB MATERNITY 4.3X12.25 (PERSONAL CARE ITEMS) ×3 IMPLANT
SEAL ROD LENS SCOPE MYOSURE (ABLATOR) ×3 IMPLANT
SET TUBING HYSTEROSCOPY 2 NDL (TUBING) ×3 IMPLANT
SYSTEM TISS REMOVAL MYSR XL RM (MISCELLANEOUS) ×1 IMPLANT
TOWEL OR 17X24 6PK STRL BLUE (TOWEL DISPOSABLE) ×6 IMPLANT
TUBE HYSTEROSCOPY W Y-CONNECT (TUBING) ×3 IMPLANT
WATER STERILE IRR 1000ML POUR (IV SOLUTION) ×3 IMPLANT

## 2014-07-09 NOTE — Discharge Instructions (Signed)
° °  Postoperative Instructions Hysteroscopy D & C ° °Dr. Dantae Meunier and the nursing staff have discussed postoperative instructions with you.  If you have any questions please ask them before you leave the hospital, or call Dr Nakyah Erdmann’s office at [phone].   ° °We would like to emphasize the following instructions: ° ° °? Call the office to make your follow-up appointment as recommended by Dr Jilliane Kazanjian (usually 1-2 weeks). ° °? You were given a prescription, or one was ordered for you at the pharmacy you designated.  Get that prescription filled and take the medication according to instructions. ° °? You may eat a regular diet, but slowly until you start having bowel movements. ° °? Drink plenty of water daily. ° °? Nothing in the vagina (intercourse, douching, objects of any kind) for two weeks.  When reinitiating intercourse, if it is uncomfortable, stop and make an appointment with Dr Seaton Hofmann to be evaluated. ° °? No driving for one to two days until the effects of anesthesia has worn off.  No traveling out of town for several days. ° °? You may shower, but no baths for one week.  Walking up and down stairs is ok.  No heavy lifting, prolonged standing, repeated bending or any “working out” until your post op check. ° °? Rest frequently, listen to your body and do not push yourself and overdo it. ° °? Call if: ° °o Your pain medication does not seem strong enough. °o Worsening pain or abdominal bloating °o Persistent nausea or vomiting °o Difficulty with urination or bowel movements. °o Temperature of 101 degrees or higher. °o Heavy vaginal bleeding.  If your period is due, you may use tampons. °o You have any questions or concerns ° ° ° °

## 2014-07-09 NOTE — H&P (Signed)
  The patient was examined.  I reviewed the proposed surgery and consent form with the patient.  The dictated history and physical is current and accurate and all questions were answered. The patient is ready to proceed with surgery and has a realistic understanding and expectation for the outcome.   Anastasio Auerbach MD, 12:40 PM 07/09/2014

## 2014-07-09 NOTE — Op Note (Signed)
SHENELLE KLAS [birth year] [id number]   Post Operative Note   Date of surgery:  07/09/2014  Pre Op Dx:  Postmenopausal bleeding, endometrial polyp  Post Op Dx:  Postmenopausal bleeding, endometrial polyp, submucous myoma  Procedure:  Hysteroscopy D&C ,Myosure resection endometrial polyp and submucous myoma  Surgeon:  Donalynn Furlong P  Anesthesia:  General  EBL:  minimal  Distended media discrepancy:  50 cc saline  Complications:  None  Specimen:  #1 EMC #2 fragments of endometrial polyp and submucous myoma to pathology  Findings: EUA:  External BUS vagina with atrophic changes. Cervix flush with the upper vagina. Uterus grossly normal midline mobile. Adnexa without masses   Hysteroscopy: Large submucous myoma right anterior uterine wall 80% resected. 2 fingerlike endometrial polyps from left fundal region resected to the level of the surrounding endometrium. Endometrial pattern otherwise atrophic in appearance. Tubal ostia not visualized. Fundus, anterior/posterior uterine surfaces, lower uterine segment and endocervical canal visualized.  Procedure:  The patient was taken to the operating room underwent general anesthesia, was placed in the low dorsal lithotomy position, received a vaginal/perineal preparation with Betadine solution, an EUA was performed and the patient was draped in usual fashion. The patient had voided immediately before coming to the operating room and requested that she was not catheterized. The time out was performed by the surgical team. The cervix was visualized with a speculum, anterior lip grasped with a single-tooth tenaculum and the cervix was gently and gradually dilated to admit the large Myosure hysteroscope.  Hysteroscopy was performed with findings noted above. Using the larger resectoscopic wand the 2 polyps were resected to the level of the surrounding endometrium. The submucous myoma was then resected with approximately 80% removed noting the  remaining fragment remained flush with the myometrial wall despite lowering the pressure several times. A sharp curettage was then performed and the sample was sent separately to pathology along with the polyp/myoma fragments. Repeat hysteroscopy was performed which showed an empty cavity with good distention and good hemostasis without evidence of perforation. A paracervical block using 1% lidocaine, 10 cc total was placed. The tenaculum was removed and hemostasis was visualized at both the external cervical os and the tenaculum site. The patient was placed in the supine position, receiving intraoperative Toradol and was awakened without difficulty and taken to the recovery room in good condition having tolerated procedure well.     Anastasio Auerbach MD, 2:43 PM 07/09/2014

## 2014-07-09 NOTE — Anesthesia Postprocedure Evaluation (Signed)
  Anesthesia Post-op Note  Patient: Sabrina Melton  Procedure(s) Performed: Procedure(s): DILATATION & CURETTAGE/HYSTEROSCOPY WITH MYOSURE ABLATION (N/A)  Patient Location: PACU  Anesthesia Type:General  Level of Consciousness: awake, alert  and oriented  Airway and Oxygen Therapy: Patient Spontanous Breathing  Post-op Pain: mild  Post-op Assessment: Post-op Vital signs reviewed, Patient's Cardiovascular Status Stable, Respiratory Function Stable, Patent Airway, No signs of Nausea or vomiting and Pain level controlled. SpO2 93% on RA.   Post-op Vital Signs: Reviewed and stable  Last Vitals:  Filed Vitals:   07/09/14 1645  BP:   Pulse: 79  Temp:   Resp: 11    Complications: No apparent anesthesia complications. Had laryngospasm on induction and placement of LMA which required endotracheal intubation.

## 2014-07-09 NOTE — Transfer of Care (Signed)
Immediate Anesthesia Transfer of Care Note  Patient: Sabrina Melton  Procedure(s) Performed: Procedure(s): DILATATION & CURETTAGE/HYSTEROSCOPY WITH MYOSURE ABLATION (N/A)  Patient Location: PACU  Anesthesia Type:General  Level of Consciousness: awake, alert , oriented and patient cooperative  Airway & Oxygen Therapy: Patient Spontanous Breathing and Patient connected to face mask oxygen  Post-op Assessment: Report given to PACU RN and Post -op Vital signs reviewed and stable  Post vital signs: Reviewed and stable  Complications: No apparent anesthesia complications

## 2014-07-09 NOTE — Anesthesia Preprocedure Evaluation (Addendum)
Anesthesia Evaluation  Patient identified by MRN, date of birth, ID band Patient awake    Reviewed: Allergy & Precautions, H&P , NPO status , Patient's Chart, lab work & pertinent test results, reviewed documented beta blocker date and time   History of Anesthesia Complications (+) PONVNegative for: history of anesthetic complications  Airway Mallampati: II TM Distance: >3 FB Neck ROM: Full    Dental  (+) Dental Advisory Given   Pulmonary shortness of breath and with exertion, sleep apnea and Continuous Positive Airway Pressure Ventilation , former smoker,  breath sounds clear to auscultation  Pulmonary exam normal       Cardiovascular hypertension, Pt. on medications and On Medications Rhythm:Regular Rate:Normal     Neuro/Psych Depression    GI/Hepatic Neg liver ROS, GERD-  Medicated and Controlled,  Endo/Other  Hypothyroidism   Renal/GU negative Renal ROS     Musculoskeletal   Abdominal (+) + obese,   Peds  Hematology   Anesthesia Other Findings    PONV (postoperative nausea and vomiting)      Hypothyroidism      Obstructive sleep apnea 05/21/09 USES CPAP    Hypertension  .Marland Kitchen...  Controlled well today     Depression   GERD (gastroesophageal reflux disease)   LIVER FUNCTION TESTS.... Will use reduced dose of tylenol    Reproductive/Obstetrics                        Anesthesia Physical  Anesthesia Plan  ASA: II  Anesthesia Plan: General   Post-op Pain Management:    Induction: Intravenous  Airway Management Planned: LMA  Additional Equipment:   Intra-op Plan:   Post-operative Plan: Extubation in OR  Informed Consent: I have reviewed the patients History and Physical, chart, labs and discussed the procedure including the risks, benefits and alternatives for the proposed anesthesia with the patient or authorized representative who has indicated his/her understanding and  acceptance.   Dental advisory given  Plan Discussed with: Anesthesiologist, CRNA and Surgeon  Anesthesia Plan Comments: (Plan routine monitors, LMA, poss ETT if necessary.)        Anesthesia Quick Evaluation

## 2014-07-16 NOTE — OR Nursing (Signed)
Start time should be 1300 instead 1313

## 2014-07-18 ENCOUNTER — Other Ambulatory Visit: Payer: Self-pay | Admitting: Gastroenterology

## 2014-07-18 DIAGNOSIS — R1310 Dysphagia, unspecified: Secondary | ICD-10-CM

## 2014-07-22 ENCOUNTER — Ambulatory Visit
Admission: RE | Admit: 2014-07-22 | Discharge: 2014-07-22 | Disposition: A | Discharge status: Home / Self Care | Payer: BC Managed Care – PPO | Source: Ambulatory Visit | Attending: Gastroenterology | Admitting: Gastroenterology

## 2014-07-22 DIAGNOSIS — R1310 Dysphagia, unspecified: Secondary | ICD-10-CM

## 2014-07-23 ENCOUNTER — Ambulatory Visit (INDEPENDENT_AMBULATORY_CARE_PROVIDER_SITE_OTHER): Payer: BC Managed Care – PPO | Admitting: Gynecology

## 2014-07-23 ENCOUNTER — Encounter: Payer: Self-pay | Admitting: Gynecology

## 2014-07-23 DIAGNOSIS — Z9889 Other specified postprocedural states: Secondary | ICD-10-CM

## 2014-07-23 NOTE — Progress Notes (Signed)
Sabrina Melton 08-Jul-1951 [id number]        63 y.o.  G2P1011 Presents for her postop visit status post hysteroscopic resection of endometrial polyp and submucous myoma. As done well since the surgery with no bleeding since.  Past medical history,surgical history, problem list, medications, allergies, family history and social history were all reviewed and documented in the EPIC chart.  Directed ROS with pertinent positives and negatives documented in the history of present illness/assessment and plan.  Exam: [place] assistant General appearance:  Normal Abdomen soft nontender without masses guarding rebound Pelvic external BUS vagina with atrophic changes. Cervix atrophic flush with the vagina. Uterus grossly normal midline mobile nontender. Adnexa without masses or tenderness  Assessment/Plan:  63 y.o. G2P1011 with normal postoperative checkup status post hysteroscopic resection of endometrial polyp and submucous myoma. Reviewed pictures from the surgery and pathology which showed benign endometrium and benign leiomyoma. I did review with her that she did have an episode of laryngospasm upon induction of anesthesia which was discussed with her husband postoperatively by the anesthesiologist. I did ask her to make sure in the future that if she does have surgery to alert the anesthesia that she did have laryngospasm in the past. Assuming patient does well with no further bleeding she'll follow up when she is due for her annual exam.     Anastasio Auerbach MD, 3:25 PM 07/23/2014

## 2014-07-23 NOTE — Patient Instructions (Signed)
Call if any further bleeding. Otherwise follow up when you're due for your annual exam.  Below is the line from the anesthesia report did describe what occurred.  "Had laryngospasm on induction and placement of LMA which required endotracheal intubation".

## 2014-07-24 ENCOUNTER — Encounter: Payer: Self-pay | Admitting: Family Medicine

## 2014-07-24 ENCOUNTER — Other Ambulatory Visit: Payer: Self-pay | Admitting: Gastroenterology

## 2014-07-24 DIAGNOSIS — R1900 Intra-abdominal and pelvic swelling, mass and lump, unspecified site: Secondary | ICD-10-CM

## 2014-07-24 DIAGNOSIS — R634 Abnormal weight loss: Secondary | ICD-10-CM

## 2014-07-24 LAB — HM COLONOSCOPY

## 2014-07-25 ENCOUNTER — Ambulatory Visit
Admission: RE | Admit: 2014-07-25 | Discharge: 2014-07-25 | Disposition: A | Discharge status: Home / Self Care | Payer: BC Managed Care – PPO | Source: Ambulatory Visit | Attending: Gastroenterology | Admitting: Gastroenterology

## 2014-07-25 ENCOUNTER — Other Ambulatory Visit: Payer: Self-pay | Admitting: Gastroenterology

## 2014-07-25 ENCOUNTER — Encounter: Payer: Self-pay | Admitting: Obstetrics and Gynecology

## 2014-07-25 DIAGNOSIS — R634 Abnormal weight loss: Secondary | ICD-10-CM

## 2014-07-25 DIAGNOSIS — R933 Abnormal findings on diagnostic imaging of other parts of digestive tract: Secondary | ICD-10-CM

## 2014-07-25 DIAGNOSIS — R1900 Intra-abdominal and pelvic swelling, mass and lump, unspecified site: Secondary | ICD-10-CM

## 2014-07-25 MED ORDER — IOHEXOL 300 MG/ML  SOLN: 100.0000 mL | Freq: Once | INTRAMUSCULAR | Status: AC | PRN

## 2014-07-28 ENCOUNTER — Encounter: Payer: Self-pay | Admitting: Family Medicine

## 2014-07-30 ENCOUNTER — Other Ambulatory Visit: Payer: Self-pay | Admitting: Gastroenterology

## 2014-07-30 ENCOUNTER — Inpatient Hospital Stay: Admission: RE | Admit: 2014-07-30 | Payer: BC Managed Care – PPO | Source: Ambulatory Visit

## 2014-07-30 ENCOUNTER — Ambulatory Visit
Admission: RE | Admit: 2014-07-30 | Discharge: 2014-07-30 | Disposition: A | Discharge status: Home / Self Care | Payer: BC Managed Care – PPO | Source: Ambulatory Visit | Attending: Gastroenterology | Admitting: Gastroenterology

## 2014-07-30 DIAGNOSIS — K3189 Other diseases of stomach and duodenum: Secondary | ICD-10-CM

## 2014-08-02 ENCOUNTER — Encounter (HOSPITAL_BASED_OUTPATIENT_CLINIC_OR_DEPARTMENT_OTHER): Payer: Self-pay | Admitting: Emergency Medicine

## 2014-08-02 ENCOUNTER — Emergency Department (HOSPITAL_BASED_OUTPATIENT_CLINIC_OR_DEPARTMENT_OTHER): Payer: BC Managed Care – PPO

## 2014-08-02 ENCOUNTER — Emergency Department (HOSPITAL_BASED_OUTPATIENT_CLINIC_OR_DEPARTMENT_OTHER)
Admission: EM | Admit: 2014-08-02 | Discharge: 2014-08-02 | Disposition: A | Discharge status: Home / Self Care | Payer: BC Managed Care – PPO | Attending: Emergency Medicine | Admitting: Emergency Medicine

## 2014-08-02 DIAGNOSIS — Z8571 Personal history of Hodgkin lymphoma: Secondary | ICD-10-CM | POA: Diagnosis not present

## 2014-08-02 DIAGNOSIS — K219 Gastro-esophageal reflux disease without esophagitis: Secondary | ICD-10-CM | POA: Insufficient documentation

## 2014-08-02 DIAGNOSIS — F329 Major depressive disorder, single episode, unspecified: Secondary | ICD-10-CM | POA: Insufficient documentation

## 2014-08-02 DIAGNOSIS — Z7951 Long term (current) use of inhaled steroids: Secondary | ICD-10-CM | POA: Diagnosis not present

## 2014-08-02 DIAGNOSIS — Z87891 Personal history of nicotine dependence: Secondary | ICD-10-CM | POA: Diagnosis not present

## 2014-08-02 DIAGNOSIS — G4733 Obstructive sleep apnea (adult) (pediatric): Secondary | ICD-10-CM | POA: Insufficient documentation

## 2014-08-02 DIAGNOSIS — Z9981 Dependence on supplemental oxygen: Secondary | ICD-10-CM | POA: Insufficient documentation

## 2014-08-02 DIAGNOSIS — E039 Hypothyroidism, unspecified: Secondary | ICD-10-CM | POA: Diagnosis not present

## 2014-08-02 DIAGNOSIS — I10 Essential (primary) hypertension: Secondary | ICD-10-CM | POA: Insufficient documentation

## 2014-08-02 DIAGNOSIS — Z79899 Other long term (current) drug therapy: Secondary | ICD-10-CM | POA: Diagnosis not present

## 2014-08-02 DIAGNOSIS — R112 Nausea with vomiting, unspecified: Secondary | ICD-10-CM | POA: Diagnosis not present

## 2014-08-02 LAB — CBC WITH DIFFERENTIAL/PLATELET
BASOS PCT: 1 % (ref 0–1)
Basophils Absolute: 0.1 10*3/uL (ref 0.0–0.1)
Eosinophils Absolute: 0.2 10*3/uL (ref 0.0–0.7)
Eosinophils Relative: 3 % (ref 0–5)
HCT: 38.9 % (ref 36.0–46.0)
Hemoglobin: 13.5 g/dL (ref 12.0–15.0)
LYMPHS PCT: 22 % (ref 12–46)
Lymphs Abs: 1.8 10*3/uL (ref 0.7–4.0)
MCH: 34.2 pg — ABNORMAL HIGH (ref 26.0–34.0)
MCHC: 34.7 g/dL (ref 30.0–36.0)
MCV: 98.5 fL (ref 78.0–100.0)
MONOS PCT: 11 % (ref 3–12)
Monocytes Absolute: 0.9 10*3/uL (ref 0.1–1.0)
NEUTROS ABS: 5.1 10*3/uL (ref 1.7–7.7)
NEUTROS PCT: 63 % (ref 43–77)
Platelets: 376 10*3/uL (ref 150–400)
RBC: 3.95 MIL/uL (ref 3.87–5.11)
RDW: 13.1 % (ref 11.5–15.5)
WBC: 8.1 10*3/uL (ref 4.0–10.5)

## 2014-08-02 LAB — BASIC METABOLIC PANEL
Anion gap: 14 (ref 5–15)
BUN: 13 mg/dL (ref 6–23)
CO2: 25 mEq/L (ref 19–32)
Calcium: 9.5 mg/dL (ref 8.4–10.5)
Chloride: 101 mEq/L (ref 96–112)
Creatinine, Ser: 0.9 mg/dL (ref 0.50–1.10)
GFR calc non Af Amer: 67 mL/min — ABNORMAL LOW (ref >90)
GFR, EST AFRICAN AMERICAN: 78 mL/min — AB (ref >90)
Glucose, Bld: 108 mg/dL — ABNORMAL HIGH (ref 70–99)
POTASSIUM: 3.7 meq/L (ref 3.7–5.3)
SODIUM: 140 meq/L (ref 137–147)

## 2014-08-02 MED ORDER — ONDANSETRON 4 MG PO TBDP: 4.0000 mg | ORAL_TABLET | Freq: Three times a day (TID) | ORAL | Status: DC | PRN

## 2014-08-02 MED ORDER — SODIUM CHLORIDE 0.9 % IV BOLUS (SEPSIS)
1000.0000 mL | Freq: Once | INTRAVENOUS | Status: AC
  Administered 2014-08-02: 1000 mL via INTRAVENOUS

## 2014-08-02 MED ORDER — ONDANSETRON HCL 4 MG/2ML IJ SOLN
4.0000 mg | Freq: Once | INTRAMUSCULAR | Status: AC
  Administered 2014-08-02: 4 mg via INTRAVENOUS
  Filled 2014-08-02: qty 2

## 2014-08-02 MED ORDER — GLUCAGON HCL RDNA (DIAGNOSTIC) 1 MG IJ SOLR
1.0000 mg | Freq: Once | INTRAMUSCULAR | Status: AC
  Administered 2014-08-02: 1 mg via INTRAVENOUS
  Filled 2014-08-02: qty 1

## 2014-08-02 MED ORDER — SODIUM CHLORIDE 0.9 % IV SOLN
Freq: Once | INTRAVENOUS | Status: AC
  Administered 2014-08-02: 12:00:00 via INTRAVENOUS

## 2014-08-02 NOTE — ED Provider Notes (Signed)
CSN: [id number]     Arrival date & time 08/02/14  1006 History   First MD Initiated Contact with Patient 08/02/14 1015     Chief Complaint  Patient presents with  . Dehydration      HPI  Patient presents for evaluation of vomiting. She started having symptoms in June. He is following with GI currently. Had a barium swallow which suggested achalasia. Had EGD. She and husband states that she was told that there was an abnormality of the stomach that seem to be inflamed and was causing a functional stenosis of the  GE junction.  Scheduled for CT scan, however she had remarked residue of barium in her colon. It was thought that this would cause artifact on CT scan. She presents today stating that she felt much better last week after she was given IV fluids during her endoscopy. She been able to take in some food and liquid but has had some regurgitation this week and was starting to feel weak and dizzy. She presents with her husband with a request to be rehydrated and consideration for her CT scan today.  Past Medical History  Diagnosis Date  . 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 Hypothyroidism   . Obstructive sleep apnea 05/21/09    USES CPAP  . LIVER FUNCTION TESTS, ABNORMAL, HX OF 01/30/2009    Qualifier: Diagnosis of  By: Harrington Challenger, MD, Shade Flood   . PONV (postoperative nausea and vomiting)   . Hypertension   . Depression   . GERD (gastroesophageal reflux disease)   . Leiomyoma 2015    Multiple small on ultrasound, largest 24 mm  . Hodgkin disease     HISTORY OF   Past Surgical History  Procedure Laterality Date  . Splenectomy, total  1977  . Tonsillectomy    . Open reduction and internal fixation of tibial shaft fracture with ext into the plafond  03/29/07    Angelena Form, M.D. [facility])  . Dilation and curettage of uterus    . Colposcopy    . Gynecologic cryosurgery    . Thyroid lobectomy Left 03/14/2013    Dr Constance Holster  . Thyroidectomy N/A 03/14/2013    Procedure: LEFT  THYROID LOBECTOMY WITH FROZEN SECTION;  Surgeon: Izora Gala, MD;  Location: [place];  Service: ENT;  Laterality: N/A;  . Hysteroscopy  2010    HYSTEROSCOPY,D&C FOR PMB AND ENDO POLYP  . Cesarean section  1979   Family History  Problem Relation Age of Onset  . Breast cancer Maternal Grandmother   . Hyperlipidemia Father    History  Substance Use Topics  . Smoking status: Former Smoker -- 0.25 packs/day for 40 years    Types: Cigarettes    Quit date: 03/02/2007  . Smokeless tobacco: Never Used  . Alcohol Use: Yes     Comment: 1-2 drink a few times per week   OB History   Grav Para Term Preterm Abortions TAB SAB Ect Mult Living   2 1 1  1     1      Review of Systems  Constitutional: Negative for fever, chills, diaphoresis, appetite change and fatigue.  HENT: Negative for mouth sores, sore throat and trouble swallowing.   Eyes: Negative for visual disturbance.  Respiratory: Negative for cough, chest tightness, shortness of breath and wheezing.   Cardiovascular: Negative for chest pain.  Gastrointestinal: Positive for nausea, vomiting and abdominal pain. Negative for diarrhea and abdominal distention.  Endocrine: Negative for polydipsia, polyphagia and polyuria.  Genitourinary:  Negative for dysuria, frequency and hematuria.  Musculoskeletal: Negative for gait problem.  Skin: Negative for color change, pallor and rash.  Neurological: Negative for dizziness, syncope, light-headedness and headaches.  Hematological: Does not bruise/bleed easily.  Psychiatric/Behavioral: Negative for behavioral problems and confusion.      Allergies  Review of patient's allergies indicates no known allergies.  Home Medications   Prior to Admission medications   Medication Sig Start Date End Date Taking? Authorizing Provider  diltiazem (DILACOR XR) 120 MG 24 hr capsule Take 1 capsule (120 mg total) by mouth daily. 08/05/13  Yes Fay Records, MD  levothyroxine (SYNTHROID, LEVOTHROID) 75 MCG  tablet TAKE 1 TABLET EVERY DAY 06/30/14  Yes Lucretia Kern, DO  omeprazole (PRILOSEC) 20 MG capsule Take 20 mg by mouth daily.   Yes Historical Provider, MD  ondansetron (ZOFRAN-ODT) 4 MG disintegrating tablet Take 4 mg by mouth every 8 (eight) hours as needed for nausea or vomiting.   Yes Historical Provider, MD  citalopram (CELEXA) 10 MG tablet Take 1 tablet (10 mg total) by mouth daily. 03/05/14   Lucretia Kern, DO  fluticasone (FLONASE) 50 MCG/ACT nasal spray USE 1 SPRAY INTO THE NOSE DAILY. 10/18/13   Lucretia Kern, DO  ondansetron (ZOFRAN ODT) 4 MG disintegrating tablet Take 1 tablet (4 mg total) by mouth every 8 (eight) hours as needed for nausea. 08/02/14   Tanna Furry, MD   BP 129/64  Pulse 89  Temp(Src) 98.1 F (36.7 C) (Oral)  Resp 18  Ht 5\' 6"  (1.676 m)  Wt 175 lb (79.379 kg)  BMI 28.26 kg/m2  SpO2 99% Physical Exam  Constitutional: She is oriented to person, place, and time. She appears well-developed and well-nourished. No distress.  HENT:  Head: Normocephalic.  Conjunctiva not pale. Oropharynx not dried.  Eyes: Conjunctivae are normal. Pupils are equal, round, and reactive to light. No scleral icterus.  Neck: Normal range of motion. Neck supple. No thyromegaly present.  Cardiovascular: Normal rate and regular rhythm.  Exam reveals no gallop and no friction rub.   No murmur heard. Pulmonary/Chest: Effort normal and breath sounds normal. No respiratory distress. She has no wheezes. She has no rales.  Abdominal: Soft. Bowel sounds are normal. She exhibits no distension. There is no tenderness. There is no rebound.  Musculoskeletal: Normal range of motion.  Neurological: She is alert and oriented to person, place, and time.  Skin: Skin is warm and dry. No rash noted.  Psychiatric: She has a normal mood and affect. Her behavior is normal.    ED Course  Procedures (including critical care time) Labs Review Labs Reviewed  BASIC METABOLIC PANEL - Abnormal; Notable for the  following:    Glucose, Bld 108 (*)    GFR calc non Af Amer 67 (*)    GFR calc Af Amer 78 (*)    All other components within normal limits  CBC WITH DIFFERENTIAL - Abnormal; Notable for the following:    MCH 34.2 (*)    All other components within normal limits    Imaging Review Dg Abd 1 View  08/02/2014   CLINICAL DATA:  The patient had a barium swallow transient barium swallow 07/22/2014. The patient now complains of constipation vomiting.  EXAM: ABDOMEN - 1 VIEW  COMPARISON:  Single view of the abdomen 07/30/2014.  FINDINGS: A small volume of contrast from the patient's barium swallow remains in the transverse and proximal descending colon. There is no small bowel obstruction. Multiple surgical clips project  the abdomen. No focal bony abnormality.  IMPRESSION: Small residual volume of contrast in the colon. The patient does not appear constipated.  No acute finding.   Electronically Signed   By: Inge Rise M.D.   On: 08/02/2014 11:24     EKG Interpretation None      MDM   Final diagnoses:  Non-intractable vomiting with nausea, vomiting of unspecified type    Patient feeling improving after hydration. KUB still shows barium in the splenic flexure, thus I did not order CT scan for today. Yesterday continue to hydrate her self and to take in fiber and whatever form she is able in hopes that she may be able to get her scan done by Wednesday when and is tentatively scheduled. BUN 28. Normal creatinine.  He was taking his liquids and able to do so with minimal symptoms after the IV glucagon.  I have given her a prescription for the oral dissolving tablet Zofran which may work better for her than the tablets she is currently taking    Tanna Furry, MD 08/03/14 [phone]

## 2014-08-02 NOTE — ED Notes (Addendum)
C/o nausea/vomiting. Says she is being worked up by Dr Earlean Shawl for digestive issues, had barrium swallow and endoscopy recently. No diarrhea. No pain. States she feels dehydrated. Last BM was Tuesday. Onset 03-2014 and has gotten worse since August.

## 2014-08-02 NOTE — ED Notes (Signed)
IVF at [facility] per Dr Jeneen Rinks.

## 2014-08-02 NOTE — Discharge Instructions (Signed)

## 2014-08-06 ENCOUNTER — Other Ambulatory Visit: Payer: Self-pay | Admitting: Gastroenterology

## 2014-08-06 ENCOUNTER — Ambulatory Visit
Admission: RE | Admit: 2014-08-06 | Discharge: 2014-08-06 | Disposition: A | Discharge status: Home / Self Care | Payer: BC Managed Care – PPO | Source: Ambulatory Visit | Attending: Gastroenterology | Admitting: Gastroenterology

## 2014-08-06 DIAGNOSIS — K5909 Other constipation: Secondary | ICD-10-CM

## 2014-08-06 DIAGNOSIS — R634 Abnormal weight loss: Secondary | ICD-10-CM

## 2014-08-06 DIAGNOSIS — R933 Abnormal findings on diagnostic imaging of other parts of digestive tract: Secondary | ICD-10-CM

## 2014-08-06 MED ORDER — IOHEXOL 300 MG/ML  SOLN
100.0000 mL | Freq: Once | INTRAMUSCULAR | Status: AC | PRN
  Administered 2014-08-06: 100 mL via INTRAVENOUS

## 2014-08-13 ENCOUNTER — Encounter (HOSPITAL_BASED_OUTPATIENT_CLINIC_OR_DEPARTMENT_OTHER): Payer: Self-pay | Admitting: Emergency Medicine

## 2014-08-13 ENCOUNTER — Emergency Department (HOSPITAL_BASED_OUTPATIENT_CLINIC_OR_DEPARTMENT_OTHER)
Admission: EM | Admit: 2014-08-13 | Discharge: 2014-08-13 | Disposition: A | Discharge status: Home / Self Care | Payer: BC Managed Care – PPO | Attending: Emergency Medicine | Admitting: Emergency Medicine

## 2014-08-13 DIAGNOSIS — R1013 Epigastric pain: Secondary | ICD-10-CM | POA: Insufficient documentation

## 2014-08-13 DIAGNOSIS — Z8571 Personal history of Hodgkin lymphoma: Secondary | ICD-10-CM | POA: Insufficient documentation

## 2014-08-13 DIAGNOSIS — R1310 Dysphagia, unspecified: Secondary | ICD-10-CM | POA: Diagnosis not present

## 2014-08-13 DIAGNOSIS — Z720 Tobacco use: Secondary | ICD-10-CM | POA: Diagnosis not present

## 2014-08-13 DIAGNOSIS — E860 Dehydration: Secondary | ICD-10-CM

## 2014-08-13 DIAGNOSIS — Z7951 Long term (current) use of inhaled steroids: Secondary | ICD-10-CM | POA: Insufficient documentation

## 2014-08-13 DIAGNOSIS — K219 Gastro-esophageal reflux disease without esophagitis: Secondary | ICD-10-CM | POA: Insufficient documentation

## 2014-08-13 DIAGNOSIS — F329 Major depressive disorder, single episode, unspecified: Secondary | ICD-10-CM | POA: Diagnosis not present

## 2014-08-13 DIAGNOSIS — Z79899 Other long term (current) drug therapy: Secondary | ICD-10-CM | POA: Insufficient documentation

## 2014-08-13 DIAGNOSIS — I10 Essential (primary) hypertension: Secondary | ICD-10-CM | POA: Insufficient documentation

## 2014-08-13 DIAGNOSIS — E039 Hypothyroidism, unspecified: Secondary | ICD-10-CM | POA: Diagnosis not present

## 2014-08-13 DIAGNOSIS — R1110 Vomiting, unspecified: Secondary | ICD-10-CM | POA: Insufficient documentation

## 2014-08-13 DIAGNOSIS — G4733 Obstructive sleep apnea (adult) (pediatric): Secondary | ICD-10-CM | POA: Insufficient documentation

## 2014-08-13 LAB — CBC WITH DIFFERENTIAL/PLATELET
Basophils Absolute: 0.1 10*3/uL (ref 0.0–0.1)
Basophils Relative: 1 % (ref 0–1)
EOS ABS: 0.1 10*3/uL (ref 0.0–0.7)
Eosinophils Relative: 1 % (ref 0–5)
HEMATOCRIT: 40.5 % (ref 36.0–46.0)
HEMOGLOBIN: 13.4 g/dL (ref 12.0–15.0)
LYMPHS ABS: 1.8 10*3/uL (ref 0.7–4.0)
Lymphocytes Relative: 19 % (ref 12–46)
MCH: 33.3 pg (ref 26.0–34.0)
MCHC: 33.1 g/dL (ref 30.0–36.0)
MCV: 100.7 fL — ABNORMAL HIGH (ref 78.0–100.0)
MONOS PCT: 12 % (ref 3–12)
Monocytes Absolute: 1.1 10*3/uL — ABNORMAL HIGH (ref 0.1–1.0)
Neutro Abs: 6.3 10*3/uL (ref 1.7–7.7)
Neutrophils Relative %: 67 % (ref 43–77)
Platelets: 359 10*3/uL (ref 150–400)
RBC: 4.02 MIL/uL (ref 3.87–5.11)
RDW: 13 % (ref 11.5–15.5)
WBC: 9.5 10*3/uL (ref 4.0–10.5)

## 2014-08-13 LAB — URINALYSIS, ROUTINE W REFLEX MICROSCOPIC
GLUCOSE, UA: NEGATIVE mg/dL
HGB URINE DIPSTICK: NEGATIVE
Ketones, ur: 40 mg/dL — AB
Leukocytes, UA: NEGATIVE
Nitrite: NEGATIVE
Protein, ur: NEGATIVE mg/dL
SPECIFIC GRAVITY, URINE: 1.029 (ref 1.005–1.030)
Urobilinogen, UA: 1 mg/dL (ref 0.0–1.0)
pH: 5.5 (ref 5.0–8.0)

## 2014-08-13 LAB — BASIC METABOLIC PANEL
Anion gap: 16 — ABNORMAL HIGH (ref 5–15)
BUN: 20 mg/dL (ref 6–23)
CHLORIDE: 102 meq/L (ref 96–112)
CO2: 25 mEq/L (ref 19–32)
Calcium: 9.6 mg/dL (ref 8.4–10.5)
Creatinine, Ser: 0.9 mg/dL (ref 0.50–1.10)
GFR calc Af Amer: 78 mL/min — ABNORMAL LOW (ref >90)
GFR, EST NON AFRICAN AMERICAN: 67 mL/min — AB (ref >90)
GLUCOSE: 91 mg/dL (ref 70–99)
Potassium: 4 mEq/L (ref 3.7–5.3)
Sodium: 143 mEq/L (ref 137–147)

## 2014-08-13 MED ORDER — SODIUM CHLORIDE 0.9 % IV BOLUS (SEPSIS)
1000.0000 mL | Freq: Once | INTRAVENOUS | Status: AC
  Administered 2014-08-13: 1000 mL via INTRAVENOUS

## 2014-08-13 NOTE — ED Provider Notes (Addendum)
CSN: [id number]     Arrival date & time 08/13/14  1443 History   First MD Initiated Contact with Patient 08/13/14 1503     Chief Complaint  Patient presents with  . Dehydration     (Consider location/radiation/quality/duration/timing/severity/associated sxs/prior Treatment) Patient is a 63 y.o. female presenting with vomiting.  Emesis Severity:  Moderate Duration: months, but worse over last 5 weeks. Timing:  Intermittent Quality:  Stomach contents Progression:  Worsening Chronicity:  New Recent urination:  Decreased Context comment:  Pt is in the midst of a GI workup for weight loss and decreased ability to tolerate PO intake.  She reports that her GI doctor thinks she has achalasia.   Relieved by:  Antiemetics Exacerbated by: eating and drinking. Associated symptoms: abdominal pain (mild epigastric pain, none currently)   Associated symptoms: no diarrhea and no fever   Associated symptoms comment:  Lightheadedness, which is worse when standing.    Past Medical History  Diagnosis Date  . 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 Hypothyroidism   . Obstructive sleep apnea 05/21/09    USES CPAP  . LIVER FUNCTION TESTS, ABNORMAL, HX OF 01/30/2009    Qualifier: Diagnosis of  By: Harrington Challenger, MD, Shade Flood   . PONV (postoperative nausea and vomiting)   . Hypertension   . Depression   . GERD (gastroesophageal reflux disease)   . Leiomyoma 2015    Multiple small on ultrasound, largest 24 mm  . Hodgkin disease     HISTORY OF   Past Surgical History  Procedure Laterality Date  . Splenectomy, total  1977  . Tonsillectomy    . Open reduction and internal fixation of tibial shaft fracture with ext into the plafond  03/29/07    Angelena Form, M.D. [facility])  . Dilation and curettage of uterus    . Colposcopy    . Gynecologic cryosurgery    . Thyroid lobectomy Left 03/14/2013    Dr Constance Holster  . Thyroidectomy N/A 03/14/2013    Procedure: LEFT THYROID LOBECTOMY WITH FROZEN SECTION;   Surgeon: Izora Gala, MD;  Location: [place];  Service: ENT;  Laterality: N/A;  . Hysteroscopy  2010    HYSTEROSCOPY,D&C FOR PMB AND ENDO POLYP  . Cesarean section  1979   Family History  Problem Relation Age of Onset  . Breast cancer Maternal Grandmother   . Hyperlipidemia Father    History  Substance Use Topics  . Smoking status: Former Smoker -- 0.25 packs/day for 40 years    Types: Cigarettes    Quit date: 03/02/2007  . Smokeless tobacco: Never Used  . Alcohol Use: Yes     Comment: 1-2 drink a few times per week   OB History   Grav Para Term Preterm Abortions TAB SAB Ect Mult Living   2 1 1  1     1      Review of Systems  Constitutional: Positive for unexpected weight change. Negative for fever.  HENT: Negative for congestion.   Respiratory: Negative for cough and shortness of breath.   Cardiovascular: Chest pain: Only associated with swallowing.  Gastrointestinal: Positive for vomiting and abdominal pain (mild epigastric pain, none currently). Negative for diarrhea.  All other systems reviewed and are negative.     Allergies  Review of patient's allergies indicates no known allergies.  Home Medications   Prior to Admission medications   Medication Sig Start Date End Date Taking? Authorizing Provider  Dexlansoprazole (DEXILANT PO) Take by mouth.   Yes Historical Provider, MD  citalopram (CELEXA) 10 MG tablet Take 1 tablet (10 mg total) by mouth daily. 03/05/14   Lucretia Kern, DO  diltiazem (DILACOR XR) 120 MG 24 hr capsule Take 1 capsule (120 mg total) by mouth daily. 08/05/13   Fay Records, MD  fluticasone (FLONASE) 50 MCG/ACT nasal spray USE 1 SPRAY INTO THE NOSE DAILY. 10/18/13   Lucretia Kern, DO  levothyroxine (SYNTHROID, LEVOTHROID) 75 MCG tablet TAKE 1 TABLET EVERY DAY 06/30/14   Lucretia Kern, DO  omeprazole (PRILOSEC) 20 MG capsule Take 20 mg by mouth daily.    Historical Provider, MD  ondansetron (ZOFRAN ODT) 4 MG disintegrating tablet Take 1 tablet (4 mg  total) by mouth every 8 (eight) hours as needed for nausea. 08/02/14   Tanna Furry, MD  ondansetron (ZOFRAN-ODT) 4 MG disintegrating tablet Take 4 mg by mouth every 8 (eight) hours as needed for nausea or vomiting.    Historical Provider, MD   BP 112/69  Pulse 100  Temp(Src) 98.2 F (36.8 C) (Oral)  Resp 20  Ht 5\' 6"  (1.676 m)  Wt 170 lb (77.111 kg)  BMI 27.45 kg/m2  SpO2 98% Physical Exam  Nursing note and vitals reviewed. Constitutional: She is oriented to person, place, and time. She appears well-developed and well-nourished. No distress.  HENT:  Head: Normocephalic and atraumatic.  Mouth/Throat: Oropharynx is clear and moist and mucous membranes are normal. Mucous membranes are not dry.  Eyes: Conjunctivae are normal. Pupils are equal, round, and reactive to light. No scleral icterus.  Neck: Neck supple.  Cardiovascular: Normal rate, regular rhythm and intact distal pulses.   Murmur heard.  Systolic murmur is present with a grade of 2/6  Pulmonary/Chest: Effort normal and breath sounds normal. No stridor. No respiratory distress. She has no rales.  Abdominal: Soft. Bowel sounds are normal. She exhibits no distension. There is no tenderness.  Musculoskeletal: Normal range of motion.  Neurological: She is alert and oriented to person, place, and time.  Skin: Skin is warm and dry. No rash noted.  Mildly decreased skin turgor  Psychiatric: She has a normal mood and affect. Her behavior is normal.    ED Course  Procedures (including critical care time) Labs Review Labs Reviewed  CBC WITH DIFFERENTIAL - Abnormal; Notable for the following:    MCV 100.7 (*)    Monocytes Absolute 1.1 (*)    All other components within normal limits  BASIC METABOLIC PANEL - Abnormal; Notable for the following:    GFR calc non Af Amer 67 (*)    GFR calc Af Amer 78 (*)    Anion gap 16 (*)    All other components within normal limits  URINALYSIS, ROUTINE W REFLEX MICROSCOPIC - Abnormal; Notable  for the following:    Color, Urine AMBER (*)    APPearance CLOUDY (*)    Bilirubin Urine SMALL (*)    Ketones, ur 40 (*)    All other components within normal limits    Imaging Review No results found.   EKG Interpretation   Date/Time:  Wednesday August 13 2014 15:49:17 EDT Ventricular Rate:  80 PR Interval:  180 QRS Duration: 90 QT Interval:  398 QTC Calculation: 459 R Axis:   76 Text Interpretation:  Normal sinus rhythm Normal ECG No significant change  was found Confirmed by [facility]  MD, TREY ([id number]) on 08/13/2014 4:15:49 PM      MDM   Final diagnoses:  Difficulty in swallowing  Dehydration    63  yo female with decreased ability to tolerate PO intake over past several months who presents with the feeling that she is dehydrated.  She felt similarly about a week ago and felt much better after IV fluids.  She is well appearing and nontoxic.  Plan screening tests, IV fluids, orthostatics.   Lab tests look okay. She feels much better after IV fluids. He has tolerated by mouth fluids without difficulty. She feels better and wants to go home. She has discussed her case with her GI doctor while they have been in the emergency department and they have an outpatient plan in place. Given return precautions.  Artis Delay, MD 08/13/14 1811  Artis Delay, MD 08/13/14 [phone]

## 2014-08-13 NOTE — ED Notes (Signed)
Pt states she was seen here last week for dehydration-feels same-is currently being worked up for GI issues that involved decreased po take

## 2014-08-13 NOTE — Discharge Instructions (Signed)
Dehydration, Adult Dehydration is when you lose more fluids from the body than you take in. Vital organs like the kidneys, brain, and heart cannot function without a proper amount of fluids and salt. Any loss of fluids from the body can cause dehydration.  CAUSES   Vomiting.  Diarrhea.  Excessive sweating.  Excessive urine output.  Fever. SYMPTOMS  Mild dehydration  Thirst.  Dry lips.  Slightly dry mouth. Moderate dehydration  Very dry mouth.  Sunken eyes.  Skin does not bounce back quickly when lightly pinched and released.  Dark urine and decreased urine production.  Decreased tear production.  Headache. Severe dehydration  Very dry mouth.  Extreme thirst.  Rapid, weak pulse (more than 100 beats per minute at rest).  Cold hands and feet.  Not able to sweat in spite of heat and temperature.  Rapid breathing.  Blue lips.  Confusion and lethargy.  Difficulty being awakened.  Minimal urine production.  No tears. DIAGNOSIS  Your caregiver will diagnose dehydration based on your symptoms and your exam. Blood and urine tests will help confirm the diagnosis. The diagnostic evaluation should also identify the cause of dehydration. TREATMENT  Treatment of mild or moderate dehydration can often be done at home by increasing the amount of fluids that you drink. It is best to drink small amounts of fluid more often. Drinking too much at one time can make vomiting worse. Refer to the home care instructions below. Severe dehydration needs to be treated at the hospital where you will probably be given intravenous (IV) fluids that contain water and electrolytes. HOME CARE INSTRUCTIONS   Ask your caregiver about specific rehydration instructions.  Drink enough fluids to keep your urine clear or pale yellow.  Drink small amounts frequently if you have nausea and vomiting.  Eat as you normally do.  Avoid:  Foods or drinks high in sugar.  Carbonated  drinks.  Juice.  Extremely hot or cold fluids.  Drinks with caffeine.  Fatty, greasy foods.  Alcohol.  Tobacco.  Overeating.  Gelatin desserts.  Wash your hands well to avoid spreading bacteria and viruses.  Only take over-the-counter or prescription medicines for pain, discomfort, or fever as directed by your caregiver.  Ask your caregiver if you should continue all prescribed and over-the-counter medicines.  Keep all follow-up appointments with your caregiver. SEEK MEDICAL CARE IF:  You have abdominal pain and it increases or stays in one area (localizes).  You have a rash, stiff neck, or severe headache.  You are irritable, sleepy, or difficult to awaken.  You are weak, dizzy, or extremely thirsty. SEEK IMMEDIATE MEDICAL CARE IF:   You are unable to keep fluids down or you get worse despite treatment.  You have frequent episodes of vomiting or diarrhea.  You have blood or green matter (bile) in your vomit.  You have blood in your stool or your stool looks black and tarry.  You have not urinated in 6 to 8 hours, or you have only urinated a small amount of very dark urine.  You have a fever.  You faint. MAKE SURE YOU:   Understand these instructions.  Will watch your condition.  Will get help right away if you are not doing well or get worse. Document Released: 10/03/2005 Document Revised: 12/26/2011 Document Reviewed: 05/23/2011 [employer] Patient Information 2015 [employer]. This information is not intended to replace advice given to you by your health care provider. Make sure you discuss any questions you have with your health care   provider.  

## 2014-08-16 ENCOUNTER — Other Ambulatory Visit: Payer: Self-pay | Admitting: Internal Medicine

## 2014-08-18 ENCOUNTER — Encounter (HOSPITAL_BASED_OUTPATIENT_CLINIC_OR_DEPARTMENT_OTHER): Payer: Self-pay | Admitting: Emergency Medicine

## 2014-08-22 ENCOUNTER — Telehealth: Payer: Self-pay | Admitting: *Deleted

## 2014-08-22 ENCOUNTER — Telehealth: Payer: Self-pay | Admitting: Hematology

## 2014-08-22 NOTE — Telephone Encounter (Signed)
S/W PATIENT HUSBAND AND GAVE NP APPT FOR 11/10 @ 10:30 W/DR. FENG.  REFERRING DR. Dellis Filbert MEDOFF DX- STOMACH CA

## 2014-08-22 NOTE — Telephone Encounter (Signed)
Sabrina Melton from Dr [facility] office called stating per Dr Earlean Shawl he wanted Dr Maudie Mercury to know the second endo reveals stomach cancer and he has placed a referral to oncology.

## 2014-08-24 ENCOUNTER — Encounter (HOSPITAL_BASED_OUTPATIENT_CLINIC_OR_DEPARTMENT_OTHER): Payer: Self-pay | Admitting: *Deleted

## 2014-08-24 ENCOUNTER — Emergency Department (HOSPITAL_BASED_OUTPATIENT_CLINIC_OR_DEPARTMENT_OTHER)
Admission: EM | Admit: 2014-08-24 | Discharge: 2014-08-24 | Disposition: A | Discharge status: Home / Self Care | Payer: BC Managed Care – PPO | Attending: Emergency Medicine | Admitting: Emergency Medicine

## 2014-08-24 DIAGNOSIS — F329 Major depressive disorder, single episode, unspecified: Secondary | ICD-10-CM | POA: Diagnosis not present

## 2014-08-24 DIAGNOSIS — Z87891 Personal history of nicotine dependence: Secondary | ICD-10-CM | POA: Diagnosis not present

## 2014-08-24 DIAGNOSIS — Z79899 Other long term (current) drug therapy: Secondary | ICD-10-CM | POA: Diagnosis not present

## 2014-08-24 DIAGNOSIS — Z8571 Personal history of Hodgkin lymphoma: Secondary | ICD-10-CM | POA: Insufficient documentation

## 2014-08-24 DIAGNOSIS — E039 Hypothyroidism, unspecified: Secondary | ICD-10-CM | POA: Insufficient documentation

## 2014-08-24 DIAGNOSIS — Z9981 Dependence on supplemental oxygen: Secondary | ICD-10-CM | POA: Insufficient documentation

## 2014-08-24 DIAGNOSIS — R1310 Dysphagia, unspecified: Secondary | ICD-10-CM | POA: Insufficient documentation

## 2014-08-24 DIAGNOSIS — E860 Dehydration: Secondary | ICD-10-CM | POA: Diagnosis present

## 2014-08-24 DIAGNOSIS — G4733 Obstructive sleep apnea (adult) (pediatric): Secondary | ICD-10-CM | POA: Diagnosis not present

## 2014-08-24 DIAGNOSIS — K219 Gastro-esophageal reflux disease without esophagitis: Secondary | ICD-10-CM | POA: Diagnosis not present

## 2014-08-24 DIAGNOSIS — Z85028 Personal history of other malignant neoplasm of stomach: Secondary | ICD-10-CM | POA: Insufficient documentation

## 2014-08-24 DIAGNOSIS — Z86018 Personal history of other benign neoplasm: Secondary | ICD-10-CM | POA: Insufficient documentation

## 2014-08-24 DIAGNOSIS — Z7951 Long term (current) use of inhaled steroids: Secondary | ICD-10-CM | POA: Insufficient documentation

## 2014-08-24 DIAGNOSIS — I10 Essential (primary) hypertension: Secondary | ICD-10-CM | POA: Insufficient documentation

## 2014-08-24 HISTORY — DX: Malignant neoplasm of stomach, unspecified: C16.9

## 2014-08-24 LAB — BASIC METABOLIC PANEL
Anion gap: 21 — ABNORMAL HIGH (ref 5–15)
BUN: 18 mg/dL (ref 6–23)
CO2: 23 meq/L (ref 19–32)
CREATININE: 0.9 mg/dL (ref 0.50–1.10)
Calcium: 9.8 mg/dL (ref 8.4–10.5)
Chloride: 100 mEq/L (ref 96–112)
GFR calc Af Amer: 78 mL/min — ABNORMAL LOW (ref >90)
GFR calc non Af Amer: 67 mL/min — ABNORMAL LOW (ref >90)
GLUCOSE: 102 mg/dL — AB (ref 70–99)
Potassium: 3.5 mEq/L — ABNORMAL LOW (ref 3.7–5.3)
SODIUM: 144 meq/L (ref 137–147)

## 2014-08-24 LAB — CBC WITH DIFFERENTIAL/PLATELET
Basophils Absolute: 0.1 10*3/uL (ref 0.0–0.1)
Basophils Relative: 1 % (ref 0–1)
EOS PCT: 1 % (ref 0–5)
Eosinophils Absolute: 0.1 10*3/uL (ref 0.0–0.7)
HEMATOCRIT: 43.2 % (ref 36.0–46.0)
Hemoglobin: 14.6 g/dL (ref 12.0–15.0)
LYMPHS ABS: 1.2 10*3/uL (ref 0.7–4.0)
LYMPHS PCT: 9 % — AB (ref 12–46)
MCH: 33 pg (ref 26.0–34.0)
MCHC: 33.8 g/dL (ref 30.0–36.0)
MCV: 97.7 fL (ref 78.0–100.0)
MONO ABS: 1.1 10*3/uL — AB (ref 0.1–1.0)
Monocytes Relative: 9 % (ref 3–12)
Neutro Abs: 9.9 10*3/uL — ABNORMAL HIGH (ref 1.7–7.7)
Neutrophils Relative %: 80 % — ABNORMAL HIGH (ref 43–77)
Platelets: 374 10*3/uL (ref 150–400)
RBC: 4.42 MIL/uL (ref 3.87–5.11)
RDW: 12.9 % (ref 11.5–15.5)
WBC: 12.3 10*3/uL — ABNORMAL HIGH (ref 4.0–10.5)

## 2014-08-24 MED ORDER — SODIUM CHLORIDE 0.9 % IV BOLUS (SEPSIS)
2000.0000 mL | Freq: Once | INTRAVENOUS | Status: AC
  Administered 2014-08-24: 2000 mL via INTRAVENOUS

## 2014-08-24 NOTE — ED Notes (Signed)
Pt diagnosed on weds with stomach CA after several months of difficulty swallowing and reflux/regurgitation issues.  Pt states for past 3 days has been unable to keep even liquids down and is feeling very weak.

## 2014-08-24 NOTE — Discharge Instructions (Signed)
Follow-up with your oncologist as scheduled, and return to the emergency department if you develop any new and concerning symptoms.   Dehydration, Adult Dehydration is when you lose more fluids from the body than you take in. Vital organs like the kidneys, brain, and heart cannot function without a proper amount of fluids and salt. Any loss of fluids from the body can cause dehydration.  CAUSES   Vomiting.  Diarrhea.  Excessive sweating.  Excessive urine output.  Fever. SYMPTOMS  Mild dehydration  Thirst.  Dry lips.  Slightly dry mouth. Moderate dehydration  Very dry mouth.  Sunken eyes.  Skin does not bounce back quickly when lightly pinched and released.  Dark urine and decreased urine production.  Decreased tear production.  Headache. Severe dehydration  Very dry mouth.  Extreme thirst.  Rapid, weak pulse (more than 100 beats per minute at rest).  Cold hands and feet.  Not able to sweat in spite of heat and temperature.  Rapid breathing.  Blue lips.  Confusion and lethargy.  Difficulty being awakened.  Minimal urine production.  No tears. DIAGNOSIS  Your caregiver will diagnose dehydration based on your symptoms and your exam. Blood and urine tests will help confirm the diagnosis. The diagnostic evaluation should also identify the cause of dehydration. TREATMENT  Treatment of mild or moderate dehydration can often be done at home by increasing the amount of fluids that you drink. It is best to drink small amounts of fluid more often. Drinking too much at one time can make vomiting worse. Refer to the home care instructions below. Severe dehydration needs to be treated at the hospital where you will probably be given intravenous (IV) fluids that contain water and electrolytes. HOME CARE INSTRUCTIONS   Ask your caregiver about specific rehydration instructions.  Drink enough fluids to keep your urine clear or pale yellow.  Drink small amounts  frequently if you have nausea and vomiting.  Eat as you normally do.  Avoid:  Foods or drinks high in sugar.  Carbonated drinks.  Juice.  Extremely hot or cold fluids.  Drinks with caffeine.  Fatty, greasy foods.  Alcohol.  Tobacco.  Overeating.  Gelatin desserts.  Wash your hands well to avoid spreading bacteria and viruses.  Only take over-the-counter or prescription medicines for pain, discomfort, or fever as directed by your caregiver.  Ask your caregiver if you should continue all prescribed and over-the-counter medicines.  Keep all follow-up appointments with your caregiver. SEEK MEDICAL CARE IF:  You have abdominal pain and it increases or stays in one area (localizes).  You have a rash, stiff neck, or severe headache.  You are irritable, sleepy, or difficult to awaken.  You are weak, dizzy, or extremely thirsty. SEEK IMMEDIATE MEDICAL CARE IF:   You are unable to keep fluids down or you get worse despite treatment.  You have frequent episodes of vomiting or diarrhea.  You have blood or green matter (bile) in your vomit.  You have blood in your stool or your stool looks black and tarry.  You have not urinated in 6 to 8 hours, or you have only urinated a small amount of very dark urine.  You have a fever.  You faint. MAKE SURE YOU:   Understand these instructions.  Will watch your condition.  Will get help right away if you are not doing well or get worse. Document Released: 10/03/2005 Document Revised: 12/26/2011 Document Reviewed: 05/23/2011 [facility] Patient Information 2015 [place], [place]. This information is not intended to  replace advice given to you by your health care provider. Make sure you discuss any questions you have with your health care provider. ° °

## 2014-08-24 NOTE — ED Notes (Signed)
Pt has recent diagnosis of stomach cancer - has lost approx 39 lbs in last 2 months- difficulty eating due to esophageal problem- here for IV fluids

## 2014-08-25 NOTE — ED Provider Notes (Signed)
CSN: [id number]     Arrival date & time 08/24/14  1202 History   First MD Initiated Contact with Patient 08/24/14 1326     Chief Complaint  Patient presents with  . Dehydration     (Consider location/radiation/quality/duration/timing/severity/associated sxs/prior Treatment) HPI Comments: Patient is a 63 year old female with history of recently diagnosed stomach cancer and is awaiting an appointment with oncology to discuss her treatment.  She presents with complaints of feeling dehydrated and having difficulty swallowing.  She denies any pain, fevers, diarrhea.  She has been to the ER previously and given fluids.  She is requesting ivf as she believes she is not taking in enough fluids.    The history is provided by the patient.    Past Medical History  Diagnosis Date  . 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 Hypothyroidism   . Obstructive sleep apnea 05/21/09    USES CPAP  . LIVER FUNCTION TESTS, ABNORMAL, HX OF 01/30/2009    Qualifier: Diagnosis of  By: Harrington Challenger, MD, Shade Flood   . PONV (postoperative nausea and vomiting)   . Hypertension   . Depression   . GERD (gastroesophageal reflux disease)   . Leiomyoma 2015    Multiple small on ultrasound, largest 24 mm  . Hodgkin disease     HISTORY OF  . Stomach cancer    Past Surgical History  Procedure Laterality Date  . Splenectomy, total  1977  . Tonsillectomy    . Open reduction and internal fixation of tibial shaft fracture with ext into the plafond  03/29/07    Angelena Form, M.D. [facility])  . Dilation and curettage of uterus    . Colposcopy    . Gynecologic cryosurgery    . Thyroid lobectomy Left 03/14/2013    Dr Constance Holster  . Thyroidectomy N/A 03/14/2013    Procedure: LEFT THYROID LOBECTOMY WITH FROZEN SECTION;  Surgeon: Izora Gala, MD;  Location: [place];  Service: ENT;  Laterality: N/A;  . Hysteroscopy  2010    HYSTEROSCOPY,D&C FOR PMB AND ENDO POLYP  . Cesarean section  1979   Family History  Problem Relation Age  of Onset  . Breast cancer Maternal Grandmother   . Hyperlipidemia Father    History  Substance Use Topics  . Smoking status: Former Smoker -- 0.25 packs/day for 40 years    Types: Cigarettes    Quit date: 03/02/2007  . Smokeless tobacco: Never Used  . Alcohol Use: Yes     Comment: 1-2 drink a few times per week   OB History    Gravida Para Term Preterm AB TAB SAB Ectopic Multiple Living   2 1 1  1     1      Review of Systems  All other systems reviewed and are negative.     Allergies  Review of patient's allergies indicates no known allergies.  Home Medications   Prior to Admission medications   Medication Sig Start Date End Date Taking? Authorizing Provider  Dexlansoprazole (DEXILANT PO) Take by mouth.   Yes Historical Provider, MD  diltiazem (CARDIZEM CD) 120 MG 24 hr capsule TAKE ONE CAPSULE BY MOUTH EVERY DAY 08/18/14  Yes Fay Records, MD  levothyroxine (SYNTHROID, LEVOTHROID) 75 MCG tablet TAKE 1 TABLET EVERY DAY 06/30/14  Yes Lucretia Kern, DO  ondansetron (ZOFRAN ODT) 4 MG disintegrating tablet Take 1 tablet (4 mg total) by mouth every 8 (eight) hours as needed for nausea. 08/02/14  Yes Tanna Furry, MD  ondansetron (ZOFRAN-ODT)  4 MG disintegrating tablet Take 4 mg by mouth every 8 (eight) hours as needed for nausea or vomiting.   Yes Historical Provider, MD  promethazine (PHENERGAN) 25 MG suppository Place 25 mg rectally every 6 (six) hours as needed for nausea or vomiting.   Yes Historical Provider, MD  citalopram (CELEXA) 10 MG tablet Take 1 tablet (10 mg total) by mouth daily. 03/05/14   Lucretia Kern, DO  fluticasone (FLONASE) 50 MCG/ACT nasal spray USE 1 SPRAY INTO THE NOSE DAILY. 10/18/13   Lucretia Kern, DO  omeprazole (PRILOSEC) 20 MG capsule Take 20 mg by mouth daily.    Historical Provider, MD   BP 140/81 mmHg  Pulse 101  Temp(Src) 98 F (36.7 C)  Resp 18  Ht 5\' 6"  (1.676 m)  Wt 161 lb (73.029 kg)  BMI 26.00 kg/m2  SpO2 95% Physical Exam  Constitutional:  She is oriented to person, place, and time. She appears well-developed and well-nourished. No distress.  HENT:  Head: Normocephalic and atraumatic.  Neck: Normal range of motion. Neck supple.  Cardiovascular: Normal rate and regular rhythm.  Exam reveals no gallop and no friction rub.   No murmur heard. Pulmonary/Chest: Effort normal and breath sounds normal. No respiratory distress. She has no wheezes.  Abdominal: Soft. Bowel sounds are normal. She exhibits no distension. There is no tenderness.  Musculoskeletal: Normal range of motion.  Neurological: She is alert and oriented to person, place, and time.  Skin: Skin is warm and dry. She is not diaphoretic.  Nursing note and vitals reviewed.   ED Course  Procedures (including critical care time) Labs Review Labs Reviewed  CBC WITH DIFFERENTIAL - Abnormal; Notable for the following:    WBC 12.3 (*)    Neutrophils Relative % 80 (*)    Neutro Abs 9.9 (*)    Lymphocytes Relative 9 (*)    Monocytes Absolute 1.1 (*)    All other components within normal limits  BASIC METABOLIC PANEL - Abnormal; Notable for the following:    Potassium 3.5 (*)    Glucose, Bld 102 (*)    GFR calc non Af Amer 67 (*)    GFR calc Af Amer 78 (*)    Anion gap 21 (*)    All other components within normal limits    Imaging Review No results found.   EKG Interpretation None      MDM   Final diagnoses:  Dysphagia  Dehydration    Laboratory studies are unremarkable.  She is feeling better after 2LNS.  She will be discharged to home, to keep her oncology appointment for Tuesday as scheduled.      Veryl Speak, MD 08/25/14 [phone]

## 2014-08-26 ENCOUNTER — Ambulatory Visit: Payer: BC Managed Care – PPO

## 2014-08-26 ENCOUNTER — Inpatient Hospital Stay (HOSPITAL_COMMUNITY)
Admission: AD | Admit: 2014-08-26 | Discharge: 2014-09-09 | DRG: 374 | Disposition: A | Discharge status: Home Health | Payer: BC Managed Care – PPO | Source: Ambulatory Visit | Attending: Internal Medicine | Admitting: Internal Medicine

## 2014-08-26 ENCOUNTER — Encounter: Payer: Self-pay | Admitting: Hematology

## 2014-08-26 ENCOUNTER — Ambulatory Visit (HOSPITAL_BASED_OUTPATIENT_CLINIC_OR_DEPARTMENT_OTHER): Payer: BC Managed Care – PPO | Admitting: Hematology

## 2014-08-26 ENCOUNTER — Encounter (HOSPITAL_COMMUNITY): Payer: Self-pay

## 2014-08-26 ENCOUNTER — Inpatient Hospital Stay (HOSPITAL_COMMUNITY): Payer: BC Managed Care – PPO

## 2014-08-26 VITALS — BP 114/73 | HR 118 | Temp 98.2°F | Resp 18 | Ht 66.0 in | Wt 167.2 lb

## 2014-08-26 DIAGNOSIS — Z6831 Body mass index (BMI) 31.0-31.9, adult: Secondary | ICD-10-CM

## 2014-08-26 DIAGNOSIS — C786 Secondary malignant neoplasm of retroperitoneum and peritoneum: Secondary | ICD-10-CM | POA: Diagnosis present

## 2014-08-26 DIAGNOSIS — K660 Peritoneal adhesions (postprocedural) (postinfection): Secondary | ICD-10-CM | POA: Diagnosis present

## 2014-08-26 DIAGNOSIS — Z9221 Personal history of antineoplastic chemotherapy: Secondary | ICD-10-CM | POA: Diagnosis not present

## 2014-08-26 DIAGNOSIS — K5669 Other intestinal obstruction: Secondary | ICD-10-CM | POA: Diagnosis present

## 2014-08-26 DIAGNOSIS — C169 Malignant neoplasm of stomach, unspecified: Secondary | ICD-10-CM

## 2014-08-26 DIAGNOSIS — F32A Depression, unspecified: Secondary | ICD-10-CM | POA: Diagnosis present

## 2014-08-26 DIAGNOSIS — R059 Cough, unspecified: Secondary | ICD-10-CM

## 2014-08-26 DIAGNOSIS — E43 Unspecified severe protein-calorie malnutrition: Secondary | ICD-10-CM

## 2014-08-26 DIAGNOSIS — J811 Chronic pulmonary edema: Secondary | ICD-10-CM | POA: Diagnosis present

## 2014-08-26 DIAGNOSIS — D72829 Elevated white blood cell count, unspecified: Secondary | ICD-10-CM | POA: Diagnosis present

## 2014-08-26 DIAGNOSIS — R180 Malignant ascites: Secondary | ICD-10-CM | POA: Diagnosis present

## 2014-08-26 DIAGNOSIS — E860 Dehydration: Secondary | ICD-10-CM

## 2014-08-26 DIAGNOSIS — I82612 Acute embolism and thrombosis of superficial veins of left upper extremity: Secondary | ICD-10-CM | POA: Diagnosis not present

## 2014-08-26 DIAGNOSIS — T380X5A Adverse effect of glucocorticoids and synthetic analogues, initial encounter: Secondary | ICD-10-CM | POA: Diagnosis present

## 2014-08-26 DIAGNOSIS — R112 Nausea with vomiting, unspecified: Secondary | ICD-10-CM

## 2014-08-26 DIAGNOSIS — Z79899 Other long term (current) drug therapy: Secondary | ICD-10-CM

## 2014-08-26 DIAGNOSIS — Z803 Family history of malignant neoplasm of breast: Secondary | ICD-10-CM | POA: Diagnosis not present

## 2014-08-26 DIAGNOSIS — G4733 Obstructive sleep apnea (adult) (pediatric): Secondary | ICD-10-CM | POA: Diagnosis present

## 2014-08-26 DIAGNOSIS — L539 Erythematous condition, unspecified: Secondary | ICD-10-CM | POA: Diagnosis present

## 2014-08-26 DIAGNOSIS — I951 Orthostatic hypotension: Secondary | ICD-10-CM | POA: Diagnosis present

## 2014-08-26 DIAGNOSIS — C801 Malignant (primary) neoplasm, unspecified: Secondary | ICD-10-CM

## 2014-08-26 DIAGNOSIS — K567 Ileus, unspecified: Secondary | ICD-10-CM | POA: Diagnosis present

## 2014-08-26 DIAGNOSIS — I10 Essential (primary) hypertension: Secondary | ICD-10-CM | POA: Diagnosis present

## 2014-08-26 DIAGNOSIS — J90 Pleural effusion, not elsewhere classified: Secondary | ICD-10-CM | POA: Diagnosis present

## 2014-08-26 DIAGNOSIS — J9811 Atelectasis: Secondary | ICD-10-CM | POA: Diagnosis present

## 2014-08-26 DIAGNOSIS — E785 Hyperlipidemia, unspecified: Secondary | ICD-10-CM | POA: Diagnosis present

## 2014-08-26 DIAGNOSIS — Z8571 Personal history of Hodgkin lymphoma: Secondary | ICD-10-CM | POA: Diagnosis not present

## 2014-08-26 DIAGNOSIS — E876 Hypokalemia: Secondary | ICD-10-CM | POA: Diagnosis present

## 2014-08-26 DIAGNOSIS — Z9081 Acquired absence of spleen: Secondary | ICD-10-CM | POA: Diagnosis present

## 2014-08-26 DIAGNOSIS — F329 Major depressive disorder, single episode, unspecified: Secondary | ICD-10-CM | POA: Diagnosis present

## 2014-08-26 DIAGNOSIS — I313 Pericardial effusion (noninflammatory): Secondary | ICD-10-CM | POA: Diagnosis present

## 2014-08-26 DIAGNOSIS — E039 Hypothyroidism, unspecified: Secondary | ICD-10-CM | POA: Diagnosis present

## 2014-08-26 DIAGNOSIS — Z87891 Personal history of nicotine dependence: Secondary | ICD-10-CM | POA: Diagnosis not present

## 2014-08-26 DIAGNOSIS — R05 Cough: Secondary | ICD-10-CM

## 2014-08-26 DIAGNOSIS — K219 Gastro-esophageal reflux disease without esophagitis: Secondary | ICD-10-CM | POA: Diagnosis present

## 2014-08-26 DIAGNOSIS — C161 Malignant neoplasm of fundus of stomach: Secondary | ICD-10-CM | POA: Diagnosis present

## 2014-08-26 DIAGNOSIS — R000 Tachycardia, unspecified: Secondary | ICD-10-CM | POA: Diagnosis present

## 2014-08-26 DIAGNOSIS — I158 Other secondary hypertension: Secondary | ICD-10-CM

## 2014-08-26 LAB — CBC WITH DIFFERENTIAL/PLATELET
BASOS PCT: 0 % (ref 0–1)
Basophils Absolute: 0 10*3/uL (ref 0.0–0.1)
EOS PCT: 0 % (ref 0–5)
Eosinophils Absolute: 0 10*3/uL (ref 0.0–0.7)
HCT: 39.4 % (ref 36.0–46.0)
Hemoglobin: 13.2 g/dL (ref 12.0–15.0)
LYMPHS ABS: 1.3 10*3/uL (ref 0.7–4.0)
Lymphocytes Relative: 10 % — ABNORMAL LOW (ref 12–46)
MCH: 32.2 pg (ref 26.0–34.0)
MCHC: 33.5 g/dL (ref 30.0–36.0)
MCV: 96.1 fL (ref 78.0–100.0)
Monocytes Absolute: 1 10*3/uL (ref 0.1–1.0)
Monocytes Relative: 8 % (ref 3–12)
Neutro Abs: 10.1 10*3/uL — ABNORMAL HIGH (ref 1.7–7.7)
Neutrophils Relative %: 82 % — ABNORMAL HIGH (ref 43–77)
PLATELETS: 384 10*3/uL (ref 150–400)
RBC: 4.1 MIL/uL (ref 3.87–5.11)
RDW: 13 % (ref 11.5–15.5)
WBC: 12.5 10*3/uL — ABNORMAL HIGH (ref 4.0–10.5)

## 2014-08-26 LAB — PROTIME-INR
INR: 1.26 (ref 0.00–1.49)
Prothrombin Time: 15.9 seconds — ABNORMAL HIGH (ref 11.6–15.2)

## 2014-08-26 LAB — URINE MICROSCOPIC-ADD ON

## 2014-08-26 LAB — TSH: TSH: 7.06 u[IU]/mL — ABNORMAL HIGH (ref 0.350–4.500)

## 2014-08-26 LAB — URINALYSIS, ROUTINE W REFLEX MICROSCOPIC
Glucose, UA: NEGATIVE mg/dL
HGB URINE DIPSTICK: NEGATIVE
Ketones, ur: >80 mg/dL — AB
Leukocytes, UA: NEGATIVE
Nitrite: NEGATIVE
PROTEIN: 30 mg/dL — AB
Specific Gravity, Urine: 1.028 (ref 1.005–1.030)
UROBILINOGEN UA: 1 mg/dL (ref 0.0–1.0)
pH: 6 (ref 5.0–8.0)

## 2014-08-26 LAB — COMPREHENSIVE METABOLIC PANEL
ALBUMIN: 3.3 g/dL — AB (ref 3.5–5.2)
ALK PHOS: 85 U/L (ref 39–117)
ALT: 10 U/L (ref 0–35)
AST: 16 U/L (ref 0–37)
Anion gap: 20 — ABNORMAL HIGH (ref 5–15)
BILIRUBIN TOTAL: 0.7 mg/dL (ref 0.3–1.2)
BUN: 15 mg/dL (ref 6–23)
CHLORIDE: 97 meq/L (ref 96–112)
CO2: 21 meq/L (ref 19–32)
CREATININE: 0.79 mg/dL (ref 0.50–1.10)
Calcium: 9.8 mg/dL (ref 8.4–10.5)
GFR, EST NON AFRICAN AMERICAN: 87 mL/min — AB (ref >90)
GLUCOSE: 101 mg/dL — AB (ref 70–99)
Potassium: 3.4 mEq/L — ABNORMAL LOW (ref 3.7–5.3)
Sodium: 138 mEq/L (ref 137–147)
Total Protein: 7.4 g/dL (ref 6.0–8.3)

## 2014-08-26 LAB — HEMOGLOBIN A1C
Hgb A1c MFr Bld: 5.9 % — ABNORMAL HIGH (ref <5.7)
Mean Plasma Glucose: 123 mg/dL — ABNORMAL HIGH (ref <117)

## 2014-08-26 LAB — MAGNESIUM: Magnesium: 1.7 mg/dL (ref 1.5–2.5)

## 2014-08-26 LAB — APTT: aPTT: 29 seconds (ref 24–37)

## 2014-08-26 LAB — PHOSPHORUS: Phosphorus: 3.3 mg/dL (ref 2.3–4.6)

## 2014-08-26 MED ORDER — SODIUM CHLORIDE 0.9 % IV SOLN
INTRAVENOUS | Status: DC
  Administered 2014-08-26 – 2014-08-27 (×2): via INTRAVENOUS

## 2014-08-26 MED ORDER — SODIUM CHLORIDE 0.9 % IV BOLUS (SEPSIS)
1000.0000 mL | Freq: Once | INTRAVENOUS | Status: AC
  Administered 2014-08-26: 1000 mL via INTRAVENOUS

## 2014-08-26 MED ORDER — PROMETHAZINE HCL 25 MG PO TABS: 25.0000 mg | ORAL_TABLET | Freq: Four times a day (QID) | ORAL | Status: DC | PRN

## 2014-08-26 MED ORDER — ENOXAPARIN SODIUM 40 MG/0.4ML ~~LOC~~ SOLN
40.0000 mg | SUBCUTANEOUS | Status: DC
  Administered 2014-08-27 – 2014-08-28 (×2): 40 mg via SUBCUTANEOUS
  Filled 2014-08-26 (×5): qty 0.4

## 2014-08-26 MED ORDER — DOCUSATE SODIUM 100 MG PO CAPS
100.0000 mg | ORAL_CAPSULE | Freq: Two times a day (BID) | ORAL | Status: DC
  Administered 2014-08-31 – 2014-09-01 (×2): 100 mg via ORAL
  Filled 2014-08-26 (×18): qty 1

## 2014-08-26 MED ORDER — MORPHINE SULFATE 2 MG/ML IJ SOLN
2.0000 mg | INTRAMUSCULAR | Status: DC | PRN
  Administered 2014-08-27 – 2014-09-04 (×33): 2 mg via INTRAVENOUS
  Filled 2014-08-26 (×35): qty 1

## 2014-08-26 MED ORDER — SORBITOL 70 % SOLN: 30.0000 mL | Freq: Every day | Status: DC | PRN

## 2014-08-26 MED ORDER — PANTOPRAZOLE SODIUM 40 MG IV SOLR
40.0000 mg | INTRAVENOUS | Status: DC
  Administered 2014-08-26 – 2014-09-03 (×8): 40 mg via INTRAVENOUS
  Filled 2014-08-26 (×10): qty 40

## 2014-08-26 MED ORDER — POTASSIUM CHLORIDE 10 MEQ/100ML IV SOLN
10.0000 meq | INTRAVENOUS | Status: AC
  Administered 2014-08-26 – 2014-08-27 (×2): 10 meq via INTRAVENOUS
  Filled 2014-08-26 (×2): qty 100

## 2014-08-26 MED ORDER — HYDRALAZINE HCL 20 MG/ML IJ SOLN: 10.0000 mg | Freq: Four times a day (QID) | INTRAMUSCULAR | Status: DC | PRN

## 2014-08-26 MED ORDER — ACETAMINOPHEN 325 MG PO TABS: 650.0000 mg | ORAL_TABLET | Freq: Four times a day (QID) | ORAL | Status: DC | PRN

## 2014-08-26 MED ORDER — ALBUTEROL SULFATE (2.5 MG/3ML) 0.083% IN NEBU: 2.5000 mg | INHALATION_SOLUTION | RESPIRATORY_TRACT | Status: DC | PRN

## 2014-08-26 MED ORDER — ONDANSETRON 8 MG/NS 50 ML IVPB
8.0000 mg | Freq: Three times a day (TID) | INTRAVENOUS | Status: DC
  Administered 2014-08-26 – 2014-08-27 (×3): 8 mg via INTRAVENOUS
  Filled 2014-08-26 (×4): qty 8

## 2014-08-26 MED ORDER — LEVOTHYROXINE SODIUM 100 MCG IV SOLR
37.5000 ug | Freq: Every day | INTRAVENOUS | Status: DC
  Administered 2014-08-26 – 2014-09-03 (×9): 37.5 ug via INTRAVENOUS
  Filled 2014-08-26 (×11): qty 5

## 2014-08-26 MED ORDER — IPRATROPIUM BROMIDE 0.02 % IN SOLN: 0.5000 mg | RESPIRATORY_TRACT | Status: DC | PRN

## 2014-08-26 MED ORDER — MAGNESIUM CITRATE PO SOLN: 1.0000 | Freq: Once | ORAL | Status: AC | PRN

## 2014-08-26 MED ORDER — MAGNESIUM HYDROXIDE 400 MG/5ML PO SUSP: 30.0000 mL | Freq: Every day | ORAL | Status: DC | PRN

## 2014-08-26 MED ORDER — ACETAMINOPHEN 650 MG RE SUPP
650.0000 mg | Freq: Four times a day (QID) | RECTAL | Status: DC | PRN
  Administered 2014-08-26: 650 mg via RECTAL
  Filled 2014-08-26: qty 1

## 2014-08-26 MED ORDER — FLUTICASONE PROPIONATE 50 MCG/ACT NA SUSP
1.0000 | Freq: Every day | NASAL | Status: DC
  Filled 2014-08-26: qty 16

## 2014-08-26 MED ORDER — POTASSIUM CHLORIDE 20 MEQ/15ML (10%) PO SOLN
20.0000 meq | Freq: Once | ORAL | Status: DC
  Filled 2014-08-26: qty 15

## 2014-08-26 NOTE — H&P (Signed)
[facility] History and Physical  Sabrina Melton FUX:[id number] DOB: 01/01/1951 DOA: 08/26/2014  Referring physician: Elson Clan  PCP: Lucretia Kern., DO   Chief Complaint: dehydration  HPI: Sabrina Melton is a 63 y.o. female  With history of recently diagnosed gastric cancer, hypothyroidism, gastroesophageal reflux disease, hypertension, depression, prior history of Hodgkin's disease who presented at oncologist's office for evaluation. Patient was noted to be orthostatic and noted to be tachycardic and dehydrated and a such patient was sent to the hospital as a direct admission for further evaluation and management. Patient states for the past week has been feeling very dehydrated, decreased appetite, nauseous, with emesis. Patient states she can't keep anything down and vomits every time she charts to eat or drink anything. Patient did state had an episode of blood in her emesis. Patient was some complaints of upper abdominal pain. Patient endorses some generalized weakness, and a nonproductive cough. Patient states hasn't had a bowel movement in about a week. Patient denies any fevers, no chills, no chest pain, shortness of breath,no dysuria, no melena, no hematemesis, no hematochezia, no diarrhea, no constipation. Patient was seen in the oncologist office and was sent to the hospital for further management.   Review of Systems: as per history of present illness otherwise negative. Constitutional:  No weight loss, night sweats, Fevers, chills, fatigue.  HEENT:  No headaches, Difficulty swallowing,Tooth/dental problems,Sore throat,  No sneezing, itching, ear ache, nasal congestion, post nasal drip,  Cardio-vascular:  No chest pain, Orthopnea, PND, swelling in lower extremities, anasarca, dizziness, palpitations  GI:  No heartburn, indigestion, abdominal pain, nausea, vomiting, diarrhea, change in bowel habits, loss of appetite  Resp:  No shortness of breath with  exertion or at rest. No excess mucus, no productive cough, No non-productive cough, No coughing up of blood.No change in color of mucus.No wheezing.No chest wall deformity  Skin:  no rash or lesions.  GU:  no dysuria, change in color of urine, no urgency or frequency. No flank pain.  Musculoskeletal:  No joint pain or swelling. No decreased range of motion. No back pain.  Psych:  No change in mood or affect. No depression or anxiety. No memory loss.   Past Medical History  Diagnosis Date  . 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 Hypothyroidism   . Obstructive sleep apnea 05/21/09    USES CPAP  . LIVER FUNCTION TESTS, ABNORMAL, HX OF 01/30/2009    Qualifier: Diagnosis of  By: Harrington Challenger, MD, Shade Flood   . PONV (postoperative nausea and vomiting)   . Hypertension   . Depression   . GERD (gastroesophageal reflux disease)   . Leiomyoma 2015    Multiple small on ultrasound, largest 24 mm  . Hodgkin disease     HISTORY OF  . Stomach cancer    Past Surgical History  Procedure Laterality Date  . Splenectomy, total  1977  . Tonsillectomy    . Open reduction and internal fixation of tibial shaft fracture with ext into the plafond  03/29/07    Angelena Form, M.D. [facility])  . Dilation and curettage of uterus    . Colposcopy    . Gynecologic cryosurgery    . Thyroid lobectomy Left 03/14/2013    Dr Constance Holster  . Thyroidectomy N/A 03/14/2013    Procedure: LEFT THYROID LOBECTOMY WITH FROZEN SECTION;  Surgeon: Izora Gala, MD;  Location: [place];  Service: ENT;  Laterality: N/A;  . Hysteroscopy  2010    HYSTEROSCOPY,D&C FOR PMB AND ENDO  POLYP  . Cesarean section  1979   Social History:  reports that she quit smoking about 7 years ago. Her smoking use included Cigarettes. She has a 10 pack-year smoking history. She has never used smokeless tobacco. She reports that she drinks alcohol. She reports that she does not use illicit drugs.  No Known Allergies  Family History  Problem Relation Age  of Onset  . Breast cancer Maternal Grandmother   . Hyperlipidemia Father      Prior to Admission medications   Medication Sig Start Date End Date Taking? Authorizing Provider  Dexlansoprazole (DEXILANT PO) Take by mouth.    Historical Provider, MD  diltiazem (CARDIZEM CD) 120 MG 24 hr capsule TAKE ONE CAPSULE BY MOUTH EVERY DAY 08/18/14   Fay Records, MD  fluticasone [facility]) 50 MCG/ACT nasal spray USE 1 SPRAY INTO THE NOSE DAILY. 10/18/13   Lucretia Kern, DO  levothyroxine (SYNTHROID, LEVOTHROID) 75 MCG tablet TAKE 1 TABLET EVERY DAY 06/30/14   Lucretia Kern, DO  ondansetron (ZOFRAN-ODT) 4 MG disintegrating tablet Take 4 mg by mouth every 8 (eight) hours as needed for nausea or vomiting.    Historical Provider, MD  promethazine (PHENERGAN) 25 MG suppository Place 25 mg rectally every 6 (six) hours as needed for nausea or vomiting.    Historical Provider, MD   Physical Exam: Filed Vitals:   08/26/14 1300 08/26/14 1302  BP: 133/81   Pulse: 107   Temp: 98.2 F (36.8 C)   TempSrc: Oral   Resp: 16   Height: 5\' 6"  (1.676 m) 5\' 6"  (1.676 m)  Weight: 75.751 kg (167 lb)   SpO2: 99%     Wt Readings from Last 3 Encounters:  08/26/14 75.751 kg (167 lb)  08/26/14 75.841 kg (167 lb 3.2 oz)  08/24/14 73.029 kg (161 lb)    General: Well developed, in no acute cardiopulmonary distress.  Eyes: PERRLA, EOMI, normal lids, irises & conjunctiva ENT: grossly normal hearing, lips & tongue. Dry mucous membranes. Neck: no LAD, masses or thyromegaly Cardiovascular: tachycardic no m/r/g. No LE edema. Respiratory: CTA bilaterally, no w/r/r. Normal respiratory effort. Abdomen: soft, ntnd, positive bowel sounds, no rebound, no guarding Skin: no rash or induration seen on limited exam Musculoskeletal: grossly normal tone BUE/BLE Psychiatric: grossly normal mood and affect, speech fluent and appropriate Neurologic: alert and oriented 3. Cranial nerves II through XII are grossly intact. Sensation is intact.  Visual fields are intact. No focal deficits.          Labs on Admission:  Basic Metabolic Panel:  Recent Labs Lab 08/24/14 1315  NA 144  K 3.5*  CL 100  CO2 23  GLUCOSE 102*  BUN 18  CREATININE 0.90  CALCIUM 9.8   Liver Function Tests: No results for input(s): AST, ALT, ALKPHOS, BILITOT, PROT, ALBUMIN in the last 168 hours. No results for input(s): LIPASE, AMYLASE in the last 168 hours. No results for input(s): AMMONIA in the last 168 hours. CBC:  Recent Labs Lab 08/24/14 1315  WBC 12.3*  NEUTROABS 9.9*  HGB 14.6  HCT 43.2  MCV 97.7  PLT 374   Cardiac Enzymes: No results for input(s): CKTOTAL, CKMB, CKMBINDEX, TROPONINI in the last 168 hours.  BNP (last 3 results) No results for input(s): PROBNP in the last 8760 hours. CBG: No results for input(s): GLUCAP in the last 168 hours.  Radiological Exams on Admission: No results found.  EKG: None  Assessment/Plan Principal Problem:   Dehydration Active Problems:  Nausea with vomiting   Gastric cancer   Obstructive sleep apnea   Essential hypertension   Hypothyroidism   Hyperlipemia   Tachycardia   Orthostasis   Depression   GERD (gastroesophageal reflux disease)  #1 dehydration Secondary to nausea and vomiting and gastric cancer with possible/probable small bowel obstruction. Will keep patient on clears for now. Will get an acute abdominal series. Check a comprehensive metabolic profile. Check a CBC. Check a magnesium level. Check a TSH. If abdominal films are consistent with complete obstruction will make patient nothing by mouth and place on bowel rest. Will place on IV fluids. Antiemetics, supportive care.follow.  #2 nausea/ vomiting Likely secondary to gastric cancer. Will check an acute abdominal series to rule out obstruction.we'll place on IV fluids, antibiotics, supportive care.  #3 recently diagnosed gastric cancer Oncology recommended J-tube placement per general surgery and as such will  consult with general surgery. We'll get a CT of the chest for staging. Oncology to follow during the hospitalization.  #4 hypertension Stable. Will monitor for now. Hydralazine as needed.  #5 hypothyroidism Check a TSH. Will place on IV Synthroid.  #6 orthostasis Hydrated with IV fluids. Follow.  #7 gastroesophageal reflux disease PPI.  #8 obstructive sleep apnea  #9 depression Stable.  #10 prophylaxis PPI for GI prophylaxis. Lovenox for DVT prophylaxis.   Code Status: Full DVT Prophylaxis: Lovenox Family Communication: Updated patient, no family present. Disposition Plan: Admit to medsurg  Time spent: 45 mins  [facility] MD [facility] Pager [phone]

## 2014-08-26 NOTE — Progress Notes (Signed)
[facility] NOTE  Patient Care Team: Lucretia Kern, DO as PCP - General (Family Medicine)   Referral physician: Dr. Richmond Campbell (GI)   CHIEF COMPLAINTS/PURPOSE OF CONSULTATION:  Gastric cancer   HISTORY OF PRESENTING ILLNESS:  Sabrina Melton is a 63 y.o. 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   She reports epigastric fullness since July 2015. She gradually developed poor appetite, nausea, vomiting after po intake, 5/10 LUQ abdominal pain, and has lost about 40lbs in the past 3-4 months. She was initially evaluated by her PCP, and was referred to GI Dr. Earlean Shawl. She underwent first EGD in August, which showed gastric inflammation and mucusal abnormality, but multiple biopsy was negative (per pt). 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   She has not been able to keep much food down in the past week, and drinks very little. She has dizziness when she stands up. She feels very weak and fatigued. She is not taking any of her medications in the past few days due to N/V. She had 2 ED visits for dehydration in the past week, last one was 2 days ago.    MEDICAL HISTORY:  Past Medical History  Diagnosis Date  . 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 Hypothyroidism   . Obstructive sleep apnea 05/21/09    USES CPAP  . LIVER FUNCTION TESTS, ABNORMAL, HX OF 01/30/2009    Qualifier: Diagnosis of  By: Harrington Challenger, MD, [place] Flood   . PONV (postoperative nausea and vomiting)   . Hypertension   . Depression   . GERD (gastroesophageal reflux disease)   . Leiomyoma 2015    Multiple small on ultrasound, largest 24 mm  . Hodgkin disease,stage IIB, s/p radiation and chemo at [facility]      .  Stomach cancer 08/2014    SURGICAL HISTORY: Past Surgical History  Procedure Laterality Date  . Splenectomy, total  1977  . Tonsillectomy    . Open reduction and internal fixation of tibial shaft fracture with ext into the plafond  03/29/07    Angelena Form, M.D. [facility])  . Dilation and curettage of uterus    . Colposcopy    . Gynecologic cryosurgery    . Thyroid lobectomy Left 03/14/2013    Dr Constance Holster  . Thyroidectomy N/A 03/14/2013    Procedure: LEFT THYROID LOBECTOMY WITH FROZEN SECTION;  Surgeon: Izora Gala, MD;  Location: [facility];  Service: ENT;  Laterality: N/A;  . Hysteroscopy  2010    HYSTEROSCOPY,D&C FOR PMB AND ENDO POLYP  . Cesarean section  1979    SOCIAL HISTORY: History   Social History  . Marital Status: Married    Spouse Name: N/A    Number of Children: 1  . Years of Education: N/A   Occupational History  . Dental Assistant    Social History Main Topics  . Smoking status: Former Smoker -- 0.25 packs/day for 40 years    Types: Cigarettes    Quit date: 03/02/2007  . Smokeless tobacco: Never Used  . Alcohol Use: Yes     Comment: 1-2 drink a few times per week  . Drug Use: No  . Sexual Activity: Yes    Birth Control/ Protection: Post-menopausal   Other Topics Concern  .  Not on file   Social History Narrative   Work or School: Personal assistant Situation: lives with husband      Spiritual Beliefs: Christian      Lifestyle: no regular exercise, diet is poor             FAMILY HISTORY: Family History  Problem Relation Age of Onset  . Breast cancer Maternal Grandmother   . Hyperlipidemia Father     ALLERGIES:  has No Known Allergies.  MEDICATIONS:  No current facility-administered medications for this visit.   No current outpatient prescriptions on file.   Facility-Administered Medications Ordered in Other Visits  Medication Dose Route Frequency Provider Last Rate Last Dose  . 0.9 %  sodium chloride infusion   Intravenous  Continuous Eugenie Filler, MD 125 mL/hr at 08/26/14 1848    . acetaminophen (TYLENOL) tablet 650 mg  650 mg Oral Q6H PRN Eugenie Filler, MD       Or  . acetaminophen (TYLENOL) suppository 650 mg  650 mg Rectal Q6H PRN Eugenie Filler, MD   650 mg at 08/26/14 1713  . albuterol (PROVENTIL) (2.5 MG/3ML) 0.083% nebulizer solution 2.5 mg  2.5 mg Nebulization Q2H PRN Eugenie Filler, MD      . docusate sodium (COLACE) capsule 100 mg  100 mg Oral BID Eugenie Filler, MD   100 mg at 08/26/14 1757  . enoxaparin (LOVENOX) injection 40 mg  40 mg Subcutaneous Q24H Eugenie Filler, MD   40 mg at 08/26/14 1911  . fluticasone (FLONASE) 50 MCG/ACT nasal spray 1 spray  1 spray Each Nare Daily Eugenie Filler, MD   1 spray at 08/26/14 1645  . hydrALAZINE (APRESOLINE) injection 10 mg  10 mg Intravenous Q6H PRN Irine Seal V, MD      . ipratropium (ATROVENT) nebulizer solution 0.5 mg  0.5 mg Nebulization Q2H PRN Eugenie Filler, MD      . levothyroxine (SYNTHROID, LEVOTHROID) injection 37.5 mcg  37.5 mcg Intravenous QAC breakfast Eugenie Filler, MD   37.5 mcg at 08/26/14 1712  . magnesium citrate solution 1 Bottle  1 Bottle Oral Once PRN Irine Seal V, MD      . magnesium hydroxide (MILK OF MAGNESIA) suspension 30 mL  30 mL Oral Daily PRN Irine Seal V, MD      . morphine 2 MG/ML injection 2 mg  2 mg Intravenous Q4H PRN Eugenie Filler, MD      . ondansetron (ZOFRAN) 8 mg/NS 50 ml IVPB  8 mg Intravenous 3 times per day Eugenie Filler, MD   8 mg at 08/26/14 1635  . pantoprazole (PROTONIX) injection 40 mg  40 mg Intravenous Q24H Eugenie Filler, MD   40 mg at 08/26/14 1713  . potassium chloride 20 MEQ/15ML (10%) solution 20 mEq  20 mEq Oral Once Gardiner Barefoot, NP      . promethazine (PHENERGAN) tablet 25 mg  25 mg Oral Q6H PRN Eugenie Filler, MD      . sorbitol 70 % solution 30 mL  30 mL Oral Daily PRN Eugenie Filler, MD        REVIEW OF SYSTEMS:   Constitutional:  Denies fevers, chills or abnormal night sweats, (+) 30-40 lbs weight loss  Eyes: Denies blurriness of vision, double vision or watery eyes Ears, nose, mouth, throat, and face: Denies mucositis or sore throat Respiratory: Denies cough, dyspnea or wheezes Cardiovascular: Denies palpitation,  chest discomfort or lower extremity swelling Gastrointestinal:  (+) nausea and vomiting, (+) LUQ abd pain, no heartburn or change in bowel habits Skin: Denies abnormal skin rashes Lymphatics: Denies new lymphadenopathy or easy bruising Neurological:Denies numbness, tingling or new weaknesses. (+) dizziness  Behavioral/Psych: Mood is stable, no new changes  All other systems were reviewed with the patient and are negative.  PHYSICAL EXAMINATION: ECOG PERFORMANCE STATUS: 3 - Symptomatic, >50% confined to bed  Filed Vitals:   08/26/14 1048  BP: 114/73  Pulse: 118  Temp: 98.2 F (36.8 C)  Resp: 18   Filed Weights   08/26/14 1048  Weight: 167 lb 3.2 oz (75.841 kg)    GENERAL:alert, no distress and comfortable SKIN: skin color, texture, turgor are normal, no rashes or significant lesions EYES: normal, conjunctiva are pink and non-injected, sclera clear OROPHARYNX:no exudate, no erythema and lips, buccal mucosa, and tongue normal  NECK: supple, thyroid normal size, non-tender, without nodularity LYMPH:  no palpable lymphadenopathy in the cervical, axillary or inguinal LUNGS: clear to auscultation and percussion with normal breathing effort HEART: regular rate & rhythm and no murmurs and no lower extremity edema ABDOMEN:abdomen soft, non-tender, (+) mild tenderness at LUQ, normal bowel sounds Musculoskeletal:no cyanosis of digits and no clubbing  PSYCH: alert & oriented x 3 with fluent speech NEURO: no focal motor/sensory deficits  LABORATORY DATA:  I have reviewed the data as listed Lab Results  Component Value Date   WBC 12.5* 08/26/2014   HGB 13.2 08/26/2014   HCT 39.4 08/26/2014   MCV  96.1 08/26/2014   PLT 384 08/26/2014    Recent Labs  07/08/14 1130  08/13/14 1604 08/24/14 1315 08/26/14 1504  NA 139  < > 143 144 138  K 4.5  < > 4.0 3.5* 3.4*  CL 99  < > 102 100 97  CO2 25  < > 25 23 21   GLUCOSE 97  < > 91 102* 101*  BUN 10  < > 20 18 15   CREATININE 0.90  < > 0.90 0.90 0.79  CALCIUM 9.6  < > 9.6 9.8 9.8  GFRNONAA 67*  < > 67* 67* 87*  GFRAA 78*  < > 78* 78* >90  PROT 7.4  --   --   --  7.4  ALBUMIN 3.8  --   --   --  3.3*  AST 50*  --   --   --  16  ALT 58*  --   --   --  10  ALKPHOS 88  --   --   --  85  BILITOT 0.4  --   --   --  0.7  < > = values in this interval not displayed.  RADIOGRAPHIC STUDIES: I have personally reviewed the radiological images as listed and agreed with the findings in the report. Dg Abd 1 View  08/06/2014   CLINICAL DATA:  Constipation. Check for retained barium before CT. Initial encounter.  EXAM: ABDOMEN - 1 VIEW  COMPARISON:  08/02/2014  FINDINGS: Interval complete clearance of barium from the colon. Bowel gas pattern is nonobstructive. No concerning intra-abdominal mass effect or calcification. An abdominal CT is pending. Surgical clips distributed throughout the bilateral and central abdomen.  IMPRESSION: Complete clearance of colonic barium.   Electronically Signed   By: Jorje Guild M.D.   On: 08/06/2014 10:53   Dg Abd 1 View  08/02/2014   CLINICAL DATA:  The patient had a barium swallow transient barium swallow 07/22/2014. The patient  now complains of constipation vomiting.  EXAM: ABDOMEN - 1 VIEW  COMPARISON:  Single view of the abdomen 07/30/2014.  FINDINGS: A small volume of contrast from the patient's barium swallow remains in the transverse and proximal descending colon. There is no small bowel obstruction. Multiple surgical clips project the abdomen. No focal bony abnormality.  IMPRESSION: Small residual volume of contrast in the colon. The patient does not appear constipated.  No acute finding.   Electronically  Signed   By: Inge Rise M.D.   On: 08/02/2014 11:24   Dg Abd 1 View  07/30/2014   CLINICAL DATA:  Evaluate for retained barium, history of gastric mass  EXAM: ABDOMEN - 1 VIEW  COMPARISON:  Ultrasound abdomen of 01/27/2010, and barium swallow of 07/22/2014  FINDINGS: A supine film of the abdomen shows retained barium obtained throughout the colon which is nondistended. Much of the barium lies over the splenic flexure which in this patient with history of gastric mass, may interfere with evaluation by CT. Therefore the patient was advised to use an enema to evacuate much of the left colon, and to reschedule for the CT of the abdomen pelvis in several days.  IMPRESSION: Barium remains primarily throughout the splenic flexure of colon which may interfere with CT evaluation. Therefore the patient has been advised to reschedule after using an enema for further bowel cleansing.   Electronically Signed   By: Ivar Drape M.D.   On: 07/30/2014 08:43   Ct Abdomen Pelvis W Contrast  08/06/2014   CLINICAL DATA:  Abnormal weight loss. A barium swallow showed a dilated distal esophagus suggesting achalasia. Apparently the patient had a recent upper endoscopy, although I cannot find the report for this in the epic system. Reportedly it showed some kind of mass. Without the biopsy results without being able to find with the record of the endoscopy, I am uncertain exactly what was seen.  EXAM: CT ABDOMEN AND PELVIS WITH CONTRAST  TECHNIQUE: Multidetector CT imaging of the abdomen and pelvis was performed using the standard protocol following bolus administration of intravenous contrast.  CONTRAST:  157mL OMNIPAQUE IOHEXOL 300 MG/ML  SOLN  COMPARISON:  Multiple exams, including 07/22/2014  FINDINGS: Lower chest: Dilated but tapering distal esophagus noted. Wall thickening in the distal esophagus.  Small left pleural effusion. Trace pericardial fluid. Calcified mitral valve.  Hepatobiliary: 4 mm hypodense lesion in  segment 2 of the liver, image 11 series 3. Mildly contracted gallbladder.  Pancreas: Unremarkable  Spleen: Splenectomy  Adrenals/Urinary Tract: Scarring of the left kidney upper pole.  Stomach/Bowel: The stomach is nondistended given the entire gastric wall a thickened appearance. Stomach antrum obscured by streak artifact from dense clips. Potential wall thickening in the gastric cardia confluent with the distal esophagus. Retroperitoneal clips in clips adjacent to the descending colon noted.  Vascular/Lymphatic: Aortoiliac atherosclerotic vascular disease. Periaortic stranding without well-defined periaortic adenopathy.  Reproductive: Uterine heterogeneity of uncertain significance. According to the patient's medical records she head a hysteroscopic resection of an endometrial polyp and submucosal myoma on 07/09/2014.  Other: Small but abnormal amount of free pelvic fluid.  Musculoskeletal: Mild degenerative arthropathy of both hips. Lower lumbar degenerative facet arthropathy with grade 1 anterolisthesis at L4-5.   IMPRESSION:  1. Abnormal wall thickening in the distal esophagus extending into the stomach antrum. The stomach is nondistended the entire gastric wall looks somewhat thickened. Gastritis could have this appearance. A mass of the gastroesophageal junction is not readily excluded. Unfortunately edspiet searching around through the  EMR I can locate none of the records related to the patient's recent endoscopy. Correlation with the pathology results from the presumably outside endoscopy is recommended.  2. Gastrohepatic ligament lymph node 0.7 cm in short axis. Part of the stomach is obscured by streak artifact from adjacent high density clips.  3. Numerous ancillary findings including atherosclerosis, small left pleural effusion, trace pericardial fluid, postoperative findings in the uterus, degenerative arthropathy of the hips, lower lumbar facet arthropathy, and a small but abnormal amount of free  pelvic fluid. Strictly speaking given the hypodensity in the anterior uterine body and free pelvic fluid I cannot exclude uterine perforation ; if there is concern for such based on clinical measures then ultrasound or sonohysterography would be suggested.    Electronically Signed   By: Sherryl Barters M.D.   On: 08/06/2014 13:55   Acute Abdominal Series  08/26/2014   CLINICAL DATA:  Left upper abdominal pain  EXAM: ACUTE ABDOMEN SERIES (ABDOMEN 2 VIEW & CHEST 1 VIEW)  COMPARISON:  CT abdomen 08/06/2014  FINDINGS: There is a relative paucity of bowel gas which may reflect decompressed bowel versus fluid-filled bowel. There are multiple surgical clips in the abdomen. No radiopaque calculi or other significant radiographic abnormality is seen. Heart size and mediastinal contours are within normal limits. Small left pleural effusion.  IMPRESSION: 1. There is a relative paucity of bowel gas which may reflect decompressed bowel versus fluid-filled bowel. If there is clinical concern regarding bowel obstruction, evaluation with CT of the abdomen/pelvis is recommended.   Electronically Signed   By: Kathreen Devoid   On: 08/26/2014 16:15    ASSESSMENT & PLAN:  63 yo female with PMH of HTN, hypothyroidism, OSA, depression, and history of stage IIB Hodgkin Lymphoma s/p mental filed radiation and chemotherapy in 1977, who presented with gastric fullness, nausea, vomiting, poor po intake and  40 lb weight loss.   Gastric cancer -I reviewed her EGD findings and recent gastric biopsy result with her and her husband. I explained her gastric cancer appears to be diffusely involved the fundus and large portion of the gastric body, and the board could be difficult to define, which is common in gastric adenocarcinoma with signal ring features.  -Her CT abdomen and pelvis does not appear to have evidence of metastasis, I would like to obtain a CT of chest to complete staging.  -Due to her severe symptoms and significant  weight loss related to her gastric cancer and outlet obstruction, I recommend her to have a J-tube placement. I plan to admit her to [facility] hospital today for dehydration, and will ask our GI surgeon to see her for lapascopic staging and J-tube placement. -We discussed that if no evidence of metastasis, the treatment plan will likely to be gastrectomy, adjuvant chemo and possible radiation.     2. Nausea, vomiting, and severe malnutrition  -related her gastric cancer  3. Orthstatic hypotension, secondly to dehydration  I recommend her to be directly admitted to [facility] hospital today for dehydration, failure of thrive. Will obtain CT chest, surgical consult for lapascopic staging and J-tube placement. She agrees with the plan. I will follow up with her during her hospital stay. I will also present her case in our GI tumor board next week.    All questions were answered. The patient knows to call the clinic with any problems, questions or concerns. I spent 40 minutes counseling the patient face to face. The total time spent in the appointment was 60 mins  and more than 50% was on counseling.     Truitt Merle, MD 08/26/2014 9:04 PM

## 2014-08-26 NOTE — Assessment & Plan Note (Signed)
-  I reviewed her EGD findings and recent gastric biopsy result with her and her husband. I explained her gastric cancer appears to be diffusely involved the fundus and large portion of the gastric body, and the board could be difficult to define, which is common in gastric adenocarcinoma with signal ring features.  -Her CT abdomen and pelvis does not appear to have evidence of metastasis, I would like to obtain a CT of chest to complete staging.  -Due to her severe symptoms and significant weight loss related to her gastric cancer and outlet obstruction, I recommend her to have a J-tube placement. I plan to admit her to [facility] hospital today for dehydration, and will ask our GI surgeon to see her for lapascopic staging and J-tube placement. -We discussed that if no evidence of metastasis, the treatment plan will likely to be gastrectomy, adjuvant chemo and possible radiation.

## 2014-08-26 NOTE — Consult Note (Signed)
Reason for Consult: nausea, vomiting, gastric cancer Referring Physician: Dr Luisa Hart is an 63 y.o. female.  HPI: 63yo F who developed an inability to tolerate a diet and a 40 lb weight loss in 2 months.  She underwent EGD with Dr Earlean Shawl, where she was told there was a gastric mass and biopsies showed cancer.  I am unable to review these records at this time.  She was admitted to the hospital due to the inability to tolerate any PO over the last few days.    Past Medical History  Diagnosis Date  . 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 Hypothyroidism   . Obstructive sleep apnea 05/21/09    USES CPAP  . LIVER FUNCTION TESTS, ABNORMAL, HX OF 01/30/2009    Qualifier: Diagnosis of  By: Harrington Challenger, MD, [place]   . PONV (postoperative nausea and vomiting)   . Hypertension   . Depression   . GERD (gastroesophageal reflux disease)   . Leiomyoma 2015    Multiple small on ultrasound, largest 24 mm  . Hodgkin disease     HISTORY OF  . Stomach cancer     Past Surgical History  Procedure Laterality Date  . Splenectomy, total  1977  . Tonsillectomy    . Open reduction and internal fixation of tibial shaft fracture with ext into the plafond  03/29/07    Angelena Form, M.D. [facility])  . Dilation and curettage of uterus    . Colposcopy    . Gynecologic cryosurgery    . Thyroid lobectomy Left 03/14/2013    Dr Constance Holster  . Thyroidectomy N/A 03/14/2013    Procedure: LEFT THYROID LOBECTOMY WITH FROZEN SECTION;  Surgeon: Izora Gala, MD;  Location: [place];  Service: ENT;  Laterality: N/A;  . Hysteroscopy  2010    HYSTEROSCOPY,D&C FOR PMB AND ENDO POLYP  . Cesarean section  1979    Family History  Problem Relation Age of Onset  . Breast cancer Maternal Grandmother   . Hyperlipidemia Father     Social History:  reports that she quit smoking about 7 years ago. Her smoking use included Cigarettes. She has a 10 pack-year smoking history. She has never used smokeless tobacco.  She reports that she drinks alcohol. She reports that she does not use illicit drugs.  Allergies: No Known Allergies  Medications: I have reviewed the patient's current medications.  Results for orders placed or performed during the hospital encounter of 08/26/14 (from the past 48 hour(s))  Comprehensive metabolic panel     Status: Abnormal   Collection Time: 08/26/14  3:04 PM  Result Value Ref Range   Sodium 138 137 - 147 mEq/L   Potassium 3.4 (L) 3.7 - 5.3 mEq/L   Chloride 97 96 - 112 mEq/L   CO2 21 19 - 32 mEq/L   Glucose, Bld 101 (H) 70 - 99 mg/dL   BUN 15 6 - 23 mg/dL   Creatinine, Ser 0.79 0.50 - 1.10 mg/dL   Calcium 9.8 8.4 - 10.5 mg/dL   Total Protein 7.4 6.0 - 8.3 g/dL   Albumin 3.3 (L) 3.5 - 5.2 g/dL   AST 16 0 - 37 U/L   ALT 10 0 - 35 U/L   Alkaline Phosphatase 85 39 - 117 U/L   Total Bilirubin 0.7 0.3 - 1.2 mg/dL   GFR calc non Af Amer 87 (L) >90 mL/min   GFR calc Af Amer >90 >90 mL/min    Comment: (NOTE) The eGFR has been  calculated using the CKD EPI equation. This calculation has not been validated in all clinical situations. eGFR's persistently <90 mL/min signify possible Chronic Kidney Disease.    Anion gap 20 (H) 5 - 15  Magnesium     Status: None   Collection Time: 08/26/14  3:04 PM  Result Value Ref Range   Magnesium 1.7 1.5 - 2.5 mg/dL  Phosphorus     Status: None   Collection Time: 08/26/14  3:04 PM  Result Value Ref Range   Phosphorus 3.3 2.3 - 4.6 mg/dL  CBC WITH DIFFERENTIAL     Status: Abnormal   Collection Time: 08/26/14  3:04 PM  Result Value Ref Range   WBC 12.5 (H) 4.0 - 10.5 K/uL   RBC 4.10 3.87 - 5.11 MIL/uL   Hemoglobin 13.2 12.0 - 15.0 g/dL   HCT 39.4 36.0 - 46.0 %   MCV 96.1 78.0 - 100.0 fL   MCH 32.2 26.0 - 34.0 pg   MCHC 33.5 30.0 - 36.0 g/dL   RDW 13.0 11.5 - 15.5 %   Platelets 384 150 - 400 K/uL   Neutrophils Relative % 82 (H) 43 - 77 %   Neutro Abs 10.1 (H) 1.7 - 7.7 K/uL   Lymphocytes Relative 10 (L) 12 - 46 %   Lymphs  Abs 1.3 0.7 - 4.0 K/uL   Monocytes Relative 8 3 - 12 %   Monocytes Absolute 1.0 0.1 - 1.0 K/uL   Eosinophils Relative 0 0 - 5 %   Eosinophils Absolute 0.0 0.0 - 0.7 K/uL   Basophils Relative 0 0 - 1 %   Basophils Absolute 0.0 0.0 - 0.1 K/uL  Protime-INR     Status: Abnormal   Collection Time: 08/26/14  3:04 PM  Result Value Ref Range   Prothrombin Time 15.9 (H) 11.6 - 15.2 seconds   INR 1.26 0.00 - 1.49  APTT     Status: None   Collection Time: 08/26/14  3:04 PM  Result Value Ref Range   aPTT 29 24 - 37 seconds    Acute Abdominal Series  08/26/2014   CLINICAL DATA:  Left upper abdominal pain  EXAM: ACUTE ABDOMEN SERIES (ABDOMEN 2 VIEW & CHEST 1 VIEW)  COMPARISON:  CT abdomen 08/06/2014  FINDINGS: There is a relative paucity of bowel gas which may reflect decompressed bowel versus fluid-filled bowel. There are multiple surgical clips in the abdomen. No radiopaque calculi or other significant radiographic abnormality is seen. Heart size and mediastinal contours are within normal limits. Small left pleural effusion.  IMPRESSION: 1. There is a relative paucity of bowel gas which may reflect decompressed bowel versus fluid-filled bowel. If there is clinical concern regarding bowel obstruction, evaluation with CT of the abdomen/pelvis is recommended.   Electronically Signed   By: Kathreen Devoid   On: 08/26/2014 16:15    Review of Systems  Constitutional: Positive for weight loss and malaise/fatigue. Negative for fever and chills.       40lb weight loss in 2 months  HENT: Positive for sore throat.   Respiratory: Negative for cough, hemoptysis and shortness of breath.   Cardiovascular: Negative for chest pain and leg swelling.  Gastrointestinal: Positive for nausea, vomiting, abdominal pain and constipation. Negative for diarrhea.  Genitourinary: Negative for dysuria, urgency and frequency.  Musculoskeletal: Negative for myalgias and joint pain.  Neurological: Negative for dizziness and  headaches.   Blood pressure 133/81, pulse 107, temperature 98.2 F (36.8 C), temperature source Oral, resp. rate 16, height 5'  6" (1.676 m), weight 167 lb (75.751 kg), SpO2 99 %. Physical Exam  Constitutional: She is oriented to person, place, and time. She appears well-developed and well-nourished.  HENT:  Head: Normocephalic and atraumatic.  Eyes: Conjunctivae are normal. Pupils are equal, round, and reactive to light.  Neck: Normal range of motion.  Respiratory: Effort normal and breath sounds normal.  GI: Soft. She exhibits no distension. There is no rebound and no guarding.  Musculoskeletal: Normal range of motion. She exhibits no edema.  Neurological: She is alert and oriented to person, place, and time.  Skin: Skin is warm and dry.    Assessment/Plan: This is a 64 y.o. F who apparently was diagnosed with gastric cancer recently.  Her albumin is WNL currently, although she could easily be concentrated due to dehydration.  I will get a pre-albumin in the AM.  I will discuss her case with Dr Burr Medico.  She will get a CT scan of her chest while in the hospital to continue her metastatic work up.  Will certainly place a J tube if the benefits outweigh the significant risks that these feeding tubes carry.    Jabron Weese C. 19/69/4098, 4:43 PM

## 2014-08-27 ENCOUNTER — Encounter (HOSPITAL_COMMUNITY): Payer: Self-pay | Admitting: Radiology

## 2014-08-27 ENCOUNTER — Inpatient Hospital Stay (HOSPITAL_COMMUNITY): Payer: BC Managed Care – PPO | Admitting: Anesthesiology

## 2014-08-27 ENCOUNTER — Inpatient Hospital Stay (HOSPITAL_COMMUNITY): Payer: BC Managed Care – PPO

## 2014-08-27 ENCOUNTER — Encounter (HOSPITAL_COMMUNITY)
Admission: AD | Disposition: A | Discharge status: Home Health | Payer: Self-pay | Source: Ambulatory Visit | Attending: Internal Medicine

## 2014-08-27 DIAGNOSIS — Z8579 Personal history of other malignant neoplasms of lymphoid, hematopoietic and related tissues: Secondary | ICD-10-CM

## 2014-08-27 DIAGNOSIS — R1110 Vomiting, unspecified: Secondary | ICD-10-CM

## 2014-08-27 HISTORY — PX: LAPAROSCOPY: SHX197

## 2014-08-27 LAB — BASIC METABOLIC PANEL
Anion gap: 16 — ABNORMAL HIGH (ref 5–15)
BUN: 14 mg/dL (ref 6–23)
CHLORIDE: 107 meq/L (ref 96–112)
CO2: 21 meq/L (ref 19–32)
CREATININE: 0.7 mg/dL (ref 0.50–1.10)
Calcium: 8.7 mg/dL (ref 8.4–10.5)
GFR calc Af Amer: >90 mL/min (ref >90)
GFR calc non Af Amer: >90 mL/min (ref >90)
Glucose, Bld: 86 mg/dL (ref 70–99)
Potassium: 3.2 mEq/L — ABNORMAL LOW (ref 3.7–5.3)
Sodium: 144 mEq/L (ref 137–147)

## 2014-08-27 LAB — CBC
HEMATOCRIT: 35.6 % — AB (ref 36.0–46.0)
HEMOGLOBIN: 12 g/dL (ref 12.0–15.0)
MCH: 32.6 pg (ref 26.0–34.0)
MCHC: 33.7 g/dL (ref 30.0–36.0)
MCV: 96.7 fL (ref 78.0–100.0)
Platelets: 356 10*3/uL (ref 150–400)
RBC: 3.68 MIL/uL — ABNORMAL LOW (ref 3.87–5.11)
RDW: 13.1 % (ref 11.5–15.5)
WBC: 8.6 10*3/uL (ref 4.0–10.5)

## 2014-08-27 LAB — URINE CULTURE

## 2014-08-27 LAB — ABO/RH: ABO/RH(D): A POS

## 2014-08-27 LAB — TYPE AND SCREEN
ABO/RH(D): A POS
ANTIBODY SCREEN: NEGATIVE

## 2014-08-27 LAB — PREALBUMIN: Prealbumin: 7.8 mg/dL — ABNORMAL LOW (ref 17.0–34.0)

## 2014-08-27 SURGERY — LAPAROSCOPY, DIAGNOSTIC
Anesthesia: General | Site: Abdomen

## 2014-08-27 MED ORDER — GLYCOPYRROLATE 0.2 MG/ML IJ SOLN
INTRAMUSCULAR | Status: AC
  Filled 2014-08-27: qty 3

## 2014-08-27 MED ORDER — GLYCOPYRROLATE 0.2 MG/ML IJ SOLN
INTRAMUSCULAR | Status: DC | PRN
  Administered 2014-08-27: 0.6 mg via INTRAVENOUS

## 2014-08-27 MED ORDER — CEFOXITIN SODIUM 2 G IV SOLR
2.0000 g | INTRAVENOUS | Status: AC
  Administered 2014-08-27: 2 g via INTRAVENOUS

## 2014-08-27 MED ORDER — SCOPOLAMINE 1 MG/3DAYS TD PT72
1.0000 | MEDICATED_PATCH | TRANSDERMAL | Status: DC
  Administered 2014-08-27: 1.5 mg via TRANSDERMAL
  Filled 2014-08-27: qty 1

## 2014-08-27 MED ORDER — SCOPOLAMINE 1 MG/3DAYS TD PT72
MEDICATED_PATCH | TRANSDERMAL | Status: AC
  Filled 2014-08-27: qty 1

## 2014-08-27 MED ORDER — PROPOFOL 10 MG/ML IV BOLUS
INTRAVENOUS | Status: AC
  Filled 2014-08-27: qty 20

## 2014-08-27 MED ORDER — FENTANYL CITRATE 0.05 MG/ML IJ SOLN
INTRAMUSCULAR | Status: AC
  Filled 2014-08-27: qty 2

## 2014-08-27 MED ORDER — ONDANSETRON HCL 4 MG/2ML IJ SOLN
INTRAMUSCULAR | Status: DC | PRN
  Administered 2014-08-27: 4 mg via INTRAVENOUS

## 2014-08-27 MED ORDER — HYDROMORPHONE HCL 1 MG/ML IJ SOLN
INTRAMUSCULAR | Status: AC
  Filled 2014-08-27: qty 1

## 2014-08-27 MED ORDER — LACTATED RINGERS IV SOLN: INTRAVENOUS | Status: DC

## 2014-08-27 MED ORDER — CISATRACURIUM BESYLATE (PF) 10 MG/5ML IV SOLN
INTRAVENOUS | Status: DC | PRN
  Administered 2014-08-27: 6 mg via INTRAVENOUS
  Administered 2014-08-27 (×2): 2 mg via INTRAVENOUS

## 2014-08-27 MED ORDER — DEXAMETHASONE SODIUM PHOSPHATE 10 MG/ML IJ SOLN
INTRAMUSCULAR | Status: AC
  Filled 2014-08-27: qty 1

## 2014-08-27 MED ORDER — PROPOFOL 10 MG/ML IV BOLUS
INTRAVENOUS | Status: DC | PRN
  Administered 2014-08-27: 140 mg via INTRAVENOUS

## 2014-08-27 MED ORDER — SODIUM CHLORIDE 0.9 % IV SOLN
16.0000 mg | Freq: Three times a day (TID) | INTRAVENOUS | Status: DC
  Administered 2014-08-27 – 2014-09-09 (×38): 16 mg via INTRAVENOUS
  Filled 2014-08-27 (×42): qty 8

## 2014-08-27 MED ORDER — IOHEXOL 300 MG/ML  SOLN
80.0000 mL | Freq: Once | INTRAMUSCULAR | Status: AC | PRN
  Administered 2014-08-27: 80 mL via INTRAVENOUS

## 2014-08-27 MED ORDER — LACTATED RINGERS IR SOLN
Status: DC | PRN
  Administered 2014-08-27: 1000 mL

## 2014-08-27 MED ORDER — NEOSTIGMINE METHYLSULFATE 10 MG/10ML IV SOLN
INTRAVENOUS | Status: AC
  Filled 2014-08-27: qty 1

## 2014-08-27 MED ORDER — LACTATED RINGERS IV SOLN
INTRAVENOUS | Status: DC
  Administered 2014-08-27: 13:00:00 via INTRAVENOUS
  Administered 2014-08-27: 1000 mL via INTRAVENOUS
  Administered 2014-08-27: 16:00:00 via INTRAVENOUS

## 2014-08-27 MED ORDER — SUCCINYLCHOLINE CHLORIDE 20 MG/ML IJ SOLN
INTRAMUSCULAR | Status: DC | PRN
  Administered 2014-08-27: 100 mg via INTRAVENOUS

## 2014-08-27 MED ORDER — BUPIVACAINE-EPINEPHRINE (PF) 0.25% -1:200000 IJ SOLN
INTRAMUSCULAR | Status: AC
  Filled 2014-08-27: qty 30

## 2014-08-27 MED ORDER — CISATRACURIUM BESYLATE 20 MG/10ML IV SOLN
INTRAVENOUS | Status: AC
  Filled 2014-08-27: qty 10

## 2014-08-27 MED ORDER — DEXAMETHASONE SODIUM PHOSPHATE 10 MG/ML IJ SOLN
INTRAMUSCULAR | Status: DC | PRN
  Administered 2014-08-27: 10 mg via INTRAVENOUS

## 2014-08-27 MED ORDER — ACETAMINOPHEN 10 MG/ML IV SOLN
1000.0000 mg | Freq: Once | INTRAVENOUS | Status: AC
  Administered 2014-08-27: 1000 mg via INTRAVENOUS
  Filled 2014-08-27: qty 100

## 2014-08-27 MED ORDER — NEOSTIGMINE METHYLSULFATE 10 MG/10ML IV SOLN
INTRAVENOUS | Status: DC | PRN
  Administered 2014-08-27: 4 mg via INTRAVENOUS

## 2014-08-27 MED ORDER — DEXTROSE 5 % IV SOLN
INTRAVENOUS | Status: AC
  Filled 2014-08-27: qty 2

## 2014-08-27 MED ORDER — PROMETHAZINE HCL 25 MG/ML IJ SOLN
INTRAMUSCULAR | Status: AC
  Administered 2014-08-27: 18:00:00
  Filled 2014-08-27: qty 1

## 2014-08-27 MED ORDER — POTASSIUM CHLORIDE 2 MEQ/ML IV SOLN
INTRAVENOUS | Status: DC
  Administered 2014-08-27 – 2014-08-28 (×3): via INTRAVENOUS
  Filled 2014-08-27 (×4): qty 1000

## 2014-08-27 MED ORDER — BUPIVACAINE HCL 0.25 % IJ SOLN
INTRAMUSCULAR | Status: DC | PRN
  Administered 2014-08-27: 7 mL

## 2014-08-27 MED ORDER — PROMETHAZINE HCL 25 MG/ML IJ SOLN
6.2500 mg | INTRAMUSCULAR | Status: AC | PRN
  Administered 2014-08-27 (×2): 6.25 mg via INTRAVENOUS

## 2014-08-27 MED ORDER — HYDROMORPHONE HCL 1 MG/ML IJ SOLN
0.2500 mg | INTRAMUSCULAR | Status: DC | PRN
  Administered 2014-08-27 (×5): 0.25 mg via INTRAVENOUS

## 2014-08-27 MED ORDER — FENTANYL CITRATE 0.05 MG/ML IJ SOLN
INTRAMUSCULAR | Status: DC | PRN
  Administered 2014-08-27 (×6): 50 ug via INTRAVENOUS

## 2014-08-27 MED ORDER — ONDANSETRON HCL 4 MG/2ML IJ SOLN
INTRAMUSCULAR | Status: AC
  Filled 2014-08-27: qty 2

## 2014-08-27 MED ORDER — 0.9 % SODIUM CHLORIDE (POUR BTL) OPTIME
TOPICAL | Status: DC | PRN
  Administered 2014-08-27: 1000 mL

## 2014-08-27 SURGICAL SUPPLY — 50 items
APPLIER CLIP 5 13 M/L LIGAMAX5 (MISCELLANEOUS)
APPLIER CLIP ROT 10 11.4 M/L (STAPLE)
CABLE HI FREQUENCY MONOPOLAR (ELECTROSURGICAL) ×3 IMPLANT
CANISTER SUCTION 2500CC (MISCELLANEOUS) ×3 IMPLANT
CATH FOLEY 2WAY SLVR 30CC 22FR (CATHETERS) ×3 IMPLANT
CHLORAPREP W/TINT 26ML (MISCELLANEOUS) ×3 IMPLANT
CLIP APPLIE 5 13 M/L LIGAMAX5 (MISCELLANEOUS) IMPLANT
CLIP APPLIE ROT 10 11.4 M/L (STAPLE) IMPLANT
CUTTER FLEX LINEAR 45M (STAPLE) IMPLANT
DECANTER SPIKE VIAL GLASS SM (MISCELLANEOUS) ×3 IMPLANT
DRAPE LAPAROSCOPIC ABDOMINAL (DRAPES) ×3 IMPLANT
DRAPE UTILITY XL STRL (DRAPES) ×3 IMPLANT
ELECT REM PT RETURN 9FT ADLT (ELECTROSURGICAL) ×3
ELECTRODE REM PT RTRN 9FT ADLT (ELECTROSURGICAL) ×1 IMPLANT
GAUZE SPONGE 4X4 12PLY STRL (GAUZE/BANDAGES/DRESSINGS) ×3 IMPLANT
GLOVE BIO SURGEON STRL SZ 6.5 (GLOVE) ×4 IMPLANT
GLOVE BIO SURGEONS STRL SZ 6.5 (GLOVE) ×2
GLOVE BIOGEL PI IND STRL 7.0 (GLOVE) ×1 IMPLANT
GLOVE BIOGEL PI INDICATOR 7.0 (GLOVE) ×2
GOWN SPEC L4 XLG W/TWL (GOWN DISPOSABLE) ×9 IMPLANT
KIT BASIN OR (CUSTOM PROCEDURE TRAY) ×3 IMPLANT
LIQUID BAND (GAUZE/BANDAGES/DRESSINGS) ×3 IMPLANT
PLUG CATH AND CAP STER (CATHETERS) ×3 IMPLANT
PORT LAP GEL ALEXIS MED 5-9CM (MISCELLANEOUS) ×3 IMPLANT
POUCH SPECIMEN RETRIEVAL 10MM (ENDOMECHANICALS) ×3 IMPLANT
RELOAD 45 VASCULAR/THIN (ENDOMECHANICALS) IMPLANT
RELOAD STAPLE TA45 3.5 REG BLU (ENDOMECHANICALS) IMPLANT
SET IRRIG TUBING LAPAROSCOPIC (IRRIGATION / IRRIGATOR) ×3 IMPLANT
SHEARS HARMONIC ACE PLUS 36CM (ENDOMECHANICALS) IMPLANT
SLEEVE XCEL OPT CAN 5 100 (ENDOMECHANICALS) ×3 IMPLANT
SOLUTION ANTI FOG 6CC (MISCELLANEOUS) IMPLANT
STAPLER VISISTAT 35W (STAPLE) ×3 IMPLANT
SUT ETHILON 2 0 PS N (SUTURE) ×3 IMPLANT
SUT NOVA NAB DX-16 0-1 5-0 T12 (SUTURE) ×9 IMPLANT
SUT SILK 2 0 SH CR/8 (SUTURE) ×3 IMPLANT
SUT SILK 3 0 SH CR/8 (SUTURE) ×6 IMPLANT
SUT VIC AB 2-0 UR6 27 (SUTURE) ×12 IMPLANT
SUT VIC AB 4-0 PS2 27 (SUTURE) ×3 IMPLANT
TAPE CLOTH SURG 4X10 WHT LF (GAUZE/BANDAGES/DRESSINGS) ×3 IMPLANT
TOWEL OR 17X26 10 PK STRL BLUE (TOWEL DISPOSABLE) ×3 IMPLANT
TOWEL OR NON WOVEN STRL DISP B (DISPOSABLE) ×3 IMPLANT
TRAP SPECIMEN MUCOUS 40CC (MISCELLANEOUS) ×3 IMPLANT
TRAY FOLEY CATH 14FRSI W/METER (CATHETERS) ×3 IMPLANT
TRAY LAPAROSCOPIC (CUSTOM PROCEDURE TRAY) ×3 IMPLANT
TROCAR BLADELESS OPT 5 100 (ENDOMECHANICALS) ×3 IMPLANT
TROCAR BLADELESS OPT 5 75 (ENDOMECHANICALS) IMPLANT
TROCAR SLEEVE XCEL 5X75 (ENDOMECHANICALS) IMPLANT
TROCAR XCEL BLUNT TIP 100MML (ENDOMECHANICALS) ×3 IMPLANT
TUBING INSUFFLATION 10FT LAP (TUBING) ×3 IMPLANT
YANKAUER SUCT BULB TIP NO VENT (SUCTIONS) ×3 IMPLANT

## 2014-08-27 NOTE — Plan of Care (Signed)
Problem: Symptom Management Goal: Tolerates clear liquids without nausea/vomiting Outcome: Not Progressing Pt vomiting after clear liquids. Oral potasium converted to IV.

## 2014-08-27 NOTE — Progress Notes (Signed)
[facility] PROGRESS NOTE Assessment/Plan: Nausea with vomiting: - likely to gastric cancer,surgery to place J-tube. - CT scan with contrast of the abdomen and pelvis on 08/06/2014 shows abnormal wall thickening in the distal exogenous intending into the stomach antrum.  - increase zofran.  Dehydration - due to #1, improving with IV fluids. - Change fluids to potassium supplements.  Gastric cancer - Recently diagnosed C nausea and vomiting for details. - CT scan of the abdomen and pelvis does not appear to have evidence of metastases, CT scan of the chest with contrast pending.  Severe protein caloric malnutrition: - NJ tube get a nutrition consult.  Orthostasis Hypotension: - continue aggressive fluid hydration.  Hypothyroidism Check a TSH. Will place on IV Synthroid.  Gastroesophageal reflux disease: PPI.  Obstructive sleep apnea  Depression: Stable.     Code Status: Full DVT Prophylaxis: Lovenox Family Communication: Updated patient, no family present. Disposition Plan: Admit to [facility]   Consultants:  Onco  Procedures:  J tube  Antibiotics:  none  HPI/Subjective: Some nausea  Objective: Filed Vitals:   08/26/14 1300 08/26/14 1302 08/26/14 2058 08/27/14 0421  BP: 133/81  113/70 134/70  Pulse: 107  93 86  Temp: 98.2 F (36.8 C)  97.8 F (36.6 C) 98.4 F (36.9 C)  TempSrc: Oral  Oral Oral  Resp: 16  14 14   Height: 5\' 6"  (1.676 m) 5\' 6"  (1.676 m)    Weight: 75.751 kg (167 lb)   78.245 kg (172 lb 8 oz)  SpO2: 99%  97% 98%    Intake/Output Summary (Last 24 hours) at 08/27/14 1017 Last data filed at 08/27/14 0600  Gross per 24 hour  Intake   1400 ml  Output    350 ml  Net   1050 ml   Filed Weights   08/26/14 1300 08/27/14 0421  Weight: 75.751 kg (167 lb) 78.245 kg (172 lb 8 oz)    Exam:  General: Alert, awake, oriented x3, in no acute distress.  HEENT: No bruits, no goiter.  Heart: Regular rate and rhythm. Lungs: Good  air movement, clear Abdomen: Soft, nontender, nondistended, positive bowel sounds.    Data Reviewed: Basic Metabolic Panel:  Recent Labs Lab 08/24/14 1315 08/26/14 1504 08/27/14 0419  NA 144 138 144  K 3.5* 3.4* 3.2*  CL 100 97 107  CO2 23 21 21   GLUCOSE 102* 101* 86  BUN 18 15 14   CREATININE 0.90 0.79 0.70  CALCIUM 9.8 9.8 8.7  MG  --  1.7  --   PHOS  --  3.3  --    Liver Function Tests:  Recent Labs Lab 08/26/14 1504  AST 16  ALT 10  ALKPHOS 85  BILITOT 0.7  PROT 7.4  ALBUMIN 3.3*   No results for input(s): LIPASE, AMYLASE in the last 168 hours. No results for input(s): AMMONIA in the last 168 hours. CBC:  Recent Labs Lab 08/24/14 1315 08/26/14 1504 08/27/14 0419  WBC 12.3* 12.5* 8.6  NEUTROABS 9.9* 10.1*  --   HGB 14.6 13.2 12.0  HCT 43.2 39.4 35.6*  MCV 97.7 96.1 96.7  PLT 374 384 356   Cardiac Enzymes: No results for input(s): CKTOTAL, CKMB, CKMBINDEX, TROPONINI in the last 168 hours. BNP (last 3 results) No results for input(s): PROBNP in the last 8760 hours. CBG: No results for input(s): GLUCAP in the last 168 hours.  No results found for this or any previous visit (from the past 240 hour(s)).   Studies: Acute Abdominal  Series  08/26/2014   CLINICAL DATA:  Left upper abdominal pain  EXAM: ACUTE ABDOMEN SERIES (ABDOMEN 2 VIEW & CHEST 1 VIEW)  COMPARISON:  CT abdomen 08/06/2014  FINDINGS: There is a relative paucity of bowel gas which may reflect decompressed bowel versus fluid-filled bowel. There are multiple surgical clips in the abdomen. No radiopaque calculi or other significant radiographic abnormality is seen. Heart size and mediastinal contours are within normal limits. Small left pleural effusion.  IMPRESSION: 1. There is a relative paucity of bowel gas which may reflect decompressed bowel versus fluid-filled bowel. If there is clinical concern regarding bowel obstruction, evaluation with CT of the abdomen/pelvis is recommended.    Electronically Signed   By: Kathreen Devoid   On: 08/26/2014 16:15    Scheduled Meds: . docusate sodium  100 mg Oral BID  . enoxaparin (LOVENOX) injection  40 mg Subcutaneous Q24H  . fluticasone  1 spray Each Nare Daily  . levothyroxine  37.5 mcg Intravenous QAC breakfast  . ondansetron (ZOFRAN) IV  8 mg Intravenous 3 times per day  . pantoprazole (PROTONIX) IV  40 mg Intravenous Q24H   Continuous Infusions: . sodium chloride 125 mL/hr at 08/27/14 0304     Charlynne Cousins  [facility] Pager [phone]. If 8PM-8AM, please contact night-coverage at [email or web address], password [facility] 08/27/2014, 10:17 AM  LOS: 1 day

## 2014-08-27 NOTE — Op Note (Signed)
08/26/2014 - 08/27/2014  2:53 PM  PATIENT:  Sabrina Melton  63 y.o. female  Patient Care Team: Lucretia Kern, DO as PCP - General (Family Medicine) Truitt Merle, MD as Consulting Physician (Hematology)  PRE-OPERATIVE DIAGNOSIS:  gastric cancer  POST-OPERATIVE DIAGNOSIS:  gastric cancer  PROCEDURE:  Exploratory Laparoscopy,  Peritoneal biopsy and j tube placement  Surgeon(s): Leighton Ruff, MD Alphonsa Overall, MD  ASSISTANT: Lucia Gaskins   ANESTHESIA:   general  EBL:  Total I/O In: 1900 [I.V.:1900] Out: 450 [Urine:150; Blood:300]  DRAINS: Jejunostomy Tube 52F  SPECIMEN:  Source of Specimen:  R hemidiaphragm , ascites  DISPOSITION OF SPECIMEN:  PATHOLOGY  COUNTS:  YES  PLAN OF CARE: patient admitted  PATIENT DISPOSITION:  PACU - hemodynamically stable.  INDICATION: this is a 63 year old female who was recently diagnosed with gastric cancer.  She is unable to tolerate any by mouth intake. She is here today for a exploratory laparoscopy and J-tube placement.   OR FINDINGS: peritoneal studding suspicious for metastatic disease on right hemidiaphragm and left lateral abdominal sidewall. Gastric mass adherent to upper abdominal wall with concern for invasion outside of the stomach.  DESCRIPTION: the patient was identified in the preoperative holding area and taken to the OR where they were laid supine on the operating room table.  Gen. anesthesia was induced without difficulty. SCDs were also noted to be in place prior to the initiation of anesthesia.  The patient was then prepped and draped in the usual sterile fashion.   A surgical timeout was performed indicating the correct patient, procedure, positioning and need for preoperative antibiotics.   I began by making an upper midline incision using a scalpel through the previous laparotomy scar. This was carried down through the subcutaneous tissues using Bovie electrocautery. The fascia was incised midline. The peritoneum was entered  bluntly. The omentum and small bowel adhesions were cleared from the abdominal wall using blunt dissection and Bovie electrocautery. Once I cleared as much as I could with the current incision, I placed an Alexis wound protector and used the cover over this. A cannula was placed through the wound protector cover and the abdomen was insufflated. After this was completed a 5 mm port was placed in the right upper quadrant and right lower quadrant under direct visualization. The remaining adhesions were taken down from the abdominal wall except for a mass that appeared to be stomach that was densely adherent to the upper abdominal wall left side. The colon was identified. 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 This was mobilized as much as I could laparoscopically. I then terminated attention to the rest of the abdomen. I evaluated the right upper quadrant. The liver appeared normal. There was some small studding of the hemidiaphragm on the left side. One of these was biopsied using the biopsy forceps and sent to pathology. There was some ascites noted right upper quadrant this was also drawn off and sent to pathology for further evaluation. After this we confirmed hemostasis and desufflated the abdomen. At this point I had one of my partners Dr. Alphonsa Overall scrub in and assist me. We continued to mobilize what was felt to be the proximal jejunum. Due to her previous laparotomy this was very adherent to her retroperitoneum and spine. We used sharp dissection to carefully dissect the adhesions away from the small bowel. We were able to mobilize a loop of small intestines just distal to the ligament  of Treitz. I placed 2-0 Vicryl sutures 3 in the abdominal wall.  I then made a oblique incision in the skin and passed a 5 mm cannula through the abdominal wall and in the middle of these 3 sutures.  A 22 French J June ostomy tube was placed through the abdominal wall  defect. A small incision was made in the jejunum and the tube was inserted and fed distally. I then sutured the remaining small bowel in Witzel fashion around the J-tube. The J-tube was then secured to the abdominal wall using the 2-0 Vicryl sutures. A fourth and fifth Vicryl suture was placed to complete the fixation. I then irrigated the abdomen with normal saline. Hemostasis appeared good. The fascia was closed using #0 Novafil interrupted sutures. The skin of the trocar sites was closed using a 4-0 subcuticular suture and the skin of the midline incision was closed using staples. The J-tube was secured using 2, 2-0 nylon sutures.  A sterile dressing was applied. The patient was awakened from anesthesia and sent to the postanesthesia care unit in stable condition. All counts were correct per operating room staff.

## 2014-08-27 NOTE — Progress Notes (Signed)
PT Cancellation Note  Patient Details Name: MUMTAZ LOVINS MRN: [id number] DOB: 10-24-1950   Cancelled Treatment:    Reason Eval/Treat Not Completed: Patient at procedure or test/unavailable. Pt set to have surgery on today. Will sign off. MD please reorder PT/OT evals if needed. Thanks.    Weston Anna, MPT Pager: [phone]

## 2014-08-27 NOTE — Anesthesia Postprocedure Evaluation (Signed)
  Anesthesia Post-op Note  Patient: Sabrina Melton  Procedure(s) Performed: Procedure(s) (LRB): Exploratory Laparoscopy,  abdominal biopsy and j tube placement (N/A)  Patient Location: PACU  Anesthesia Type: General  Level of Consciousness: awake and alert   Airway and Oxygen Therapy: Patient Spontanous Breathing  Post-op Pain: mild  Post-op Assessment: Post-op Vital signs reviewed, Patient's Cardiovascular Status Stable, Respiratory Function Stable, Patent Airway and No signs of Nausea or vomiting  Last Vitals:  Filed Vitals:   08/27/14 1643  BP: 138/73  Pulse: 102  Temp: 36.8 C  Resp:     Post-op Vital Signs: stable   Complications: No apparent anesthesia complications

## 2014-08-27 NOTE — Progress Notes (Signed)
Dehydration, Malnutrition  Subjective: Pt still with nausea with any PO.    Objective: Vital signs in last 24 hours: Temp:  [97.8 F (36.6 C)-98.4 F (36.9 C)] 98.4 F (36.9 C) (11/11 0421) Pulse Rate:  [86-107] 86 (11/11 0421) Resp:  [14-16] 14 (11/11 0421) BP: (113-134)/(70-81) 134/70 mmHg (11/11 0421) SpO2:  [97 %-99 %] 98 % (11/11 0421) Weight:  [167 lb (75.751 kg)-172 lb 8 oz (78.245 kg)] 172 lb 8 oz (78.245 kg) (11/11 0421) Last BM Date: 08/20/14  Intake/Output from previous day: 11/10 0701 - 11/11 0700 In: 1400 [I.V.:1400] Out: 350 [Urine:350] Intake/Output this shift:    General appearance: alert and cooperative GI: normal findings: soft, non-tender Midline laparotomy scar noted  Lab Results:  Results for orders placed or performed during the hospital encounter of 08/26/14 (from the past 24 hour(s))  Comprehensive metabolic panel     Status: Abnormal   Collection Time: 08/26/14  3:04 PM  Result Value Ref Range   Sodium 138 137 - 147 mEq/L   Potassium 3.4 (L) 3.7 - 5.3 mEq/L   Chloride 97 96 - 112 mEq/L   CO2 21 19 - 32 mEq/L   Glucose, Bld 101 (H) 70 - 99 mg/dL   BUN 15 6 - 23 mg/dL   Creatinine, Ser 0.79 0.50 - 1.10 mg/dL   Calcium 9.8 8.4 - 10.5 mg/dL   Total Protein 7.4 6.0 - 8.3 g/dL   Albumin 3.3 (L) 3.5 - 5.2 g/dL   AST 16 0 - 37 U/L   ALT 10 0 - 35 U/L   Alkaline Phosphatase 85 39 - 117 U/L   Total Bilirubin 0.7 0.3 - 1.2 mg/dL   GFR calc non Af Amer 87 (L) >90 mL/min   GFR calc Af Amer >90 >90 mL/min   Anion gap 20 (H) 5 - 15  Magnesium     Status: None   Collection Time: 08/26/14  3:04 PM  Result Value Ref Range   Magnesium 1.7 1.5 - 2.5 mg/dL  Phosphorus     Status: None   Collection Time: 08/26/14  3:04 PM  Result Value Ref Range   Phosphorus 3.3 2.3 - 4.6 mg/dL  CBC WITH DIFFERENTIAL     Status: Abnormal   Collection Time: 08/26/14  3:04 PM  Result Value Ref Range   WBC 12.5 (H) 4.0 - 10.5 K/uL   RBC 4.10 3.87 - 5.11 MIL/uL   Hemoglobin 13.2 12.0 - 15.0 g/dL   HCT 39.4 36.0 - 46.0 %   MCV 96.1 78.0 - 100.0 fL   MCH 32.2 26.0 - 34.0 pg   MCHC 33.5 30.0 - 36.0 g/dL   RDW 13.0 11.5 - 15.5 %   Platelets 384 150 - 400 K/uL   Neutrophils Relative % 82 (H) 43 - 77 %   Neutro Abs 10.1 (H) 1.7 - 7.7 K/uL   Lymphocytes Relative 10 (L) 12 - 46 %   Lymphs Abs 1.3 0.7 - 4.0 K/uL   Monocytes Relative 8 3 - 12 %   Monocytes Absolute 1.0 0.1 - 1.0 K/uL   Eosinophils Relative 0 0 - 5 %   Eosinophils Absolute 0.0 0.0 - 0.7 K/uL   Basophils Relative 0 0 - 1 %   Basophils Absolute 0.0 0.0 - 0.1 K/uL  Protime-INR     Status: Abnormal   Collection Time: 08/26/14  3:04 PM  Result Value Ref Range   Prothrombin Time 15.9 (H) 11.6 - 15.2 seconds   INR 1.26 0.00 -  1.49  APTT     Status: None   Collection Time: 08/26/14  3:04 PM  Result Value Ref Range   aPTT 29 24 - 37 seconds  Hemoglobin A1c     Status: Abnormal   Collection Time: 08/26/14  3:04 PM  Result Value Ref Range   Hgb A1c MFr Bld 5.9 (H) <5.7 %   Mean Plasma Glucose 123 (H) <117 mg/dL  TSH     Status: Abnormal   Collection Time: 08/26/14  4:19 PM  Result Value Ref Range   TSH 7.060 (H) 0.350 - 4.500 uIU/mL  Urinalysis, Routine w reflex microscopic     Status: Abnormal   Collection Time: 08/26/14  6:54 PM  Result Value Ref Range   Color, Urine AMBER (A) YELLOW   APPearance CLOUDY (A) CLEAR   Specific Gravity, Urine 1.028 1.005 - 1.030   pH 6.0 5.0 - 8.0   Glucose, UA NEGATIVE NEGATIVE mg/dL   Hgb urine dipstick NEGATIVE NEGATIVE   Bilirubin Urine LARGE (A) NEGATIVE   Ketones, ur >80 (A) NEGATIVE mg/dL   Protein, ur 30 (A) NEGATIVE mg/dL   Urobilinogen, UA 1.0 0.0 - 1.0 mg/dL   Nitrite NEGATIVE NEGATIVE   Leukocytes, UA NEGATIVE NEGATIVE  Urine microscopic-add on     Status: Abnormal   Collection Time: 08/26/14  6:54 PM  Result Value Ref Range   Squamous Epithelial / LPF FEW (A) RARE   WBC, UA 0-2 <3 WBC/hpf   RBC / HPF 0-2 <3 RBC/hpf   Casts HYALINE  CASTS (A) NEGATIVE  Basic metabolic panel     Status: Abnormal   Collection Time: 08/27/14  4:19 AM  Result Value Ref Range   Sodium 144 137 - 147 mEq/L   Potassium 3.2 (L) 3.7 - 5.3 mEq/L   Chloride 107 96 - 112 mEq/L   CO2 21 19 - 32 mEq/L   Glucose, Bld 86 70 - 99 mg/dL   BUN 14 6 - 23 mg/dL   Creatinine, Ser 0.70 0.50 - 1.10 mg/dL   Calcium 8.7 8.4 - 10.5 mg/dL   GFR calc non Af Amer >90 >90 mL/min   GFR calc Af Amer >90 >90 mL/min   Anion gap 16 (H) 5 - 15  CBC     Status: Abnormal   Collection Time: 08/27/14  4:19 AM  Result Value Ref Range   WBC 8.6 4.0 - 10.5 K/uL   RBC 3.68 (L) 3.87 - 5.11 MIL/uL   Hemoglobin 12.0 12.0 - 15.0 g/dL   HCT 35.6 (L) 36.0 - 46.0 %   MCV 96.7 78.0 - 100.0 fL   MCH 32.6 26.0 - 34.0 pg   MCHC 33.7 30.0 - 36.0 g/dL   RDW 13.1 11.5 - 15.5 %   Platelets 356 150 - 400 K/uL     Studies/Results Radiology     MEDS, Scheduled . cefOXitin  2 g Intravenous On Call to OR  . docusate sodium  100 mg Oral BID  . enoxaparin (LOVENOX) injection  40 mg Subcutaneous Q24H  . fluticasone  1 spray Each Nare Daily  . levothyroxine  37.5 mcg Intravenous QAC breakfast  . ondansetron (ZOFRAN) IV  16 mg Intravenous 3 times per day  . pantoprazole (PROTONIX) IV  40 mg Intravenous Q24H     Assessment: Dehydration Malnutrition  Plan: To OR today for staging laparoscopy, possible biopsy and J tube placement.  I have discussed the risks and benefits with the patient and her husband.  These risks  include bleeding, infection, damage to adject structures or other bowel, bowel obstruction, J tube malfunction or blockage and need for additional surgery.  I believe she understands these risks and has agreed to proceed.    LOS: 1 day    Rosario Adie, [facility], [place]   08/27/2014 10:52 AM

## 2014-08-27 NOTE — Care Management Note (Signed)
CARE MANAGEMENT NOTE 08/27/2014  Patient:  Sabrina Melton, Sabrina Melton   Account Number:  [id number]  Date Initiated:  08/27/2014  Documentation initiated by:  Marney Doctor  Subjective/Objective Assessment:   63 yo admitted with Dehydration.  Hx of  recently diagnosed gastric cancer, hypothyroidism, gastroesophageal reflux disease, hypertension, depression, prior history of Hodgkin's disease     Action/Plan:   From home with spouse   Anticipated DC Date:  08/30/2014   Anticipated DC Plan:  [place]  CM consult      Choice offered to / List presented to:             Status of service:  In process, will continue to follow Medicare Important Message given?   (If response is "NO", the following Medicare IM given date fields will be blank) Date Medicare IM given:   Medicare IM given by:   Date Additional Medicare IM given:   Additional Medicare IM given by:    Discharge Disposition:    Per UR Regulation:  Reviewed for med. necessity/level of care/duration of stay  If discussed at [place] of Stay Meetings, dates discussed:    Comments:  08/27/14 Marney Doctor RN,BSN,NCM [phone] Chart reviewed and CM following for DC needs.  Pt to OR today for staging laparoscopy, possible biopsy and J tube placement.

## 2014-08-27 NOTE — Plan of Care (Signed)
Problem: Symptom Management Goal: Nausea/vomiting controlled with medication Outcome: Progressing

## 2014-08-27 NOTE — Transfer of Care (Signed)
Immediate Anesthesia Transfer of Care Note  Patient: Sabrina Melton  Procedure(s) Performed: Procedure(s): Exploratory Laparoscopy,  abdominal biopsy and j tube placement (N/A)  Patient Location: PACU  Anesthesia Type:General  Level of Consciousness: sedated  Airway & Oxygen Therapy: Patient Spontanous Breathing and Patient connected to face mask oxygen  Post-op Assessment: Report given to PACU RN and Post -op Vital signs reviewed and stable  Post vital signs: Reviewed and stable  Complications: No apparent anesthesia complications

## 2014-08-27 NOTE — Progress Notes (Signed)
Note pt for surgery today. Please reorder PT/OT as needed. Will sign off for now.  Pauline Aus OTR/L [phone] 08/27/2014

## 2014-08-27 NOTE — Progress Notes (Signed)
Pt has only voided 200 ml on this shift. Pt currently receiving IV fluids at 125 ml per hour. On call Baltazar Najjar NP paged. Awaiting orders. Noreene Larsson RN, BSN

## 2014-08-27 NOTE — Anesthesia Preprocedure Evaluation (Addendum)
Anesthesia Evaluation  Patient identified by MRN, date of birth, ID band Patient awake    Reviewed: Allergy & Precautions, H&P , NPO status , Patient's Chart, lab work & pertinent test results  History of Anesthesia Complications (+) PONV, DIFFICULT AIRWAY and history of anesthetic complications (Hx of being a grade III view during thyroid surgery and having problems with laryngospasm and then being a grade II view with a glidescope during a gyn procedure)  Airway Mallampati: III  TM Distance: >3 FB Neck ROM: Full    Dental no notable dental hx. (+) Teeth Intact, Dental Advisory Given   Pulmonary sleep apnea and Continuous Positive Airway Pressure Ventilation , former smoker,  breath sounds clear to auscultation  Pulmonary exam normal       Cardiovascular hypertension, Pt. on medications Rhythm:Regular Rate:Normal     Neuro/Psych PSYCHIATRIC DISORDERS Anxiety Depression negative neurological ROS     GI/Hepatic Neg liver ROS, GERD-  Medicated and Controlled,  Endo/Other  Hypothyroidism   Renal/GU negative Renal ROS  negative genitourinary   Musculoskeletal negative musculoskeletal ROS (+)   Abdominal   Peds negative pediatric ROS (+)  Hematology negative hematology ROS (+)   Anesthesia Other Findings   Reproductive/Obstetrics negative OB ROS                           Anesthesia Physical Anesthesia Plan  ASA: III  Anesthesia Plan: General   Post-op Pain Management:    Induction: Intravenous, Rapid sequence and Cricoid pressure planned  Airway Management Planned: Oral ETT and Video Laryngoscope Planned  Additional Equipment:   Intra-op Plan:   Post-operative Plan: Extubation in OR  Informed Consent: I have reviewed the patients History and Physical, chart, labs and discussed the procedure including the risks, benefits and alternatives for the proposed anesthesia with the patient or  authorized representative who has indicated his/her understanding and acceptance.   Dental advisory given  Plan Discussed with: CRNA  Anesthesia Plan Comments:         Anesthesia Quick Evaluation

## 2014-08-27 NOTE — Progress Notes (Signed)
OT Cancellation Note  Patient Details Name: Sabrina Melton MRN: [id number] DOB: 1951/04/18   Cancelled Treatment:    Reason Eval/Treat Not Completed: Patient at procedure or test/ unavailable  Jules Schick 08/27/2014, 9:41 AM

## 2014-08-27 NOTE — Progress Notes (Signed)
Sabrina Melton   DOB:Mar 23, 1951   PY#:[id number]   KNL#:[id number]  Patient Care Team: Lucretia Kern, DO as PCP - General (Family Medicine)  Subjective: Patient seen and examined. Sabrina Melton is a 63 y.o. female with a new diagnosis of gastric cancer admitted with nausea, vomiting, abdominal pain, poor oral intake and dehydration requiring Hositalist involvement  . Her nausea and vomiting are better controlled with antiemetics. Last emesis last night. She is receiving aggressive hydration. She is NPO in preparation for possible J tube placement today. Abdominal pain better controlled. Still very fatigued. Denies any respiratory or cardiac complaints. Denies any fever, chills or night sweats. No other new events overnight.   Scheduled Meds: . docusate sodium  100 mg Oral BID  . enoxaparin (LOVENOX) injection  40 mg Subcutaneous Q24H  . fluticasone  1 spray Each Nare Daily  . levothyroxine  37.5 mcg Intravenous QAC breakfast  . ondansetron (ZOFRAN) IV  8 mg Intravenous 3 times per day  . pantoprazole (PROTONIX) IV  40 mg Intravenous Q24H   Continuous Infusions: . sodium chloride 125 mL/hr at 08/27/14 0304   PRN Meds:acetaminophen **OR** acetaminophen, albuterol, hydrALAZINE, ipratropium, magnesium hydroxide, morphine injection, promethazine, sorbitol   Objective:  Filed Vitals:   08/27/14 0421  BP: 134/70  Pulse: 86  Temp: 98.4 F (36.9 C)  Resp: 14      Intake/Output Summary (Last 24 hours) at 08/27/14 0908 Last data filed at 08/27/14 0600  Gross per 24 hour  Intake   1400 ml  Output    350 ml  Net   1050 ml    ECOG PERFORMANCE STATUS:2  GENERAL:alert, no distress and comfortable SKIN: skin color, texture, turgor are normal, no rashes or significant lesions EYES: normal, conjunctiva are pink and non-injected, sclera clear OROPHARYNX:no exudate, no erythema and lips, buccal mucosa, and tongue normal  NECK: supple, thyroid normal size, non-tender, without  nodularity LYMPH: no palpable lymphadenopathy in the cervical, axillary or inguinal LUNGS: clear to auscultation and percussion with normal breathing effort HEART: regular rate & rhythm and no murmurs and no lower extremity edema ABDOMEN:abdomen soft, non-tender, mild tenderness at LUQ, normal bowel sounds Musculoskeletal:no cyanosis of digits and no clubbing  PSYCH: alert & oriented x 3 with fluent speech NEURO: no focal motor/sensory deficits    CBG (last 3)  No results for input(s): GLUCAP in the last 72 hours.   Labs:   Recent Labs Lab 08/24/14 1315 08/26/14 1504 08/27/14 0419  WBC 12.3* 12.5* 8.6  HGB 14.6 13.2 12.0  HCT 43.2 39.4 35.6*  PLT 374 384 356  MCV 97.7 96.1 96.7  MCH 33.0 32.2 32.6  MCHC 33.8 33.5 33.7  RDW 12.9 13.0 13.1  LYMPHSABS 1.2 1.3  --   MONOABS 1.1* 1.0  --   EOSABS 0.1 0.0  --   BASOSABS 0.1 0.0  --      Chemistries:    Recent Labs Lab 08/24/14 1315 08/26/14 1504 08/27/14 0419  NA 144 138 144  K 3.5* 3.4* 3.2*  CL 100 97 107  CO2 23 21 21   GLUCOSE 102* 101* 86  BUN 18 15 14   CREATININE 0.90 0.79 0.70  CALCIUM 9.8 9.8 8.7  MG  --  1.7  --   AST  --  16  --   ALT  --  10  --   ALKPHOS  --  85  --   BILITOT  --  0.7  --     GFR Estimated  Creatinine Clearance: 77 mL/min (by C-G formula based on Cr of 0.7).  Liver Function Tests:  Recent Labs Lab 08/26/14 1504  AST 16  ALT 10  ALKPHOS 85  BILITOT 0.7  PROT 7.4  ALBUMIN 3.3*   No results for input(s): LIPASE, AMYLASE in the last 168 hours. No results for input(s): AMMONIA in the last 168 hours.  Urine Studies     Component Value Date/Time   COLORURINE AMBER* 08/26/2014 1854   APPEARANCEUR CLOUDY* 08/26/2014 1854   LABSPEC 1.028 08/26/2014 1854   PHURINE 6.0 08/26/2014 [place] 08/26/2014 [place] NEGATIVE 08/26/2014 1854   BILIRUBINUR LARGE* 08/26/2014 1854   KETONESUR >80* 08/26/2014 1854   PROTEINUR 30* 08/26/2014 1854   UROBILINOGEN  1.0 08/26/2014 1854   NITRITE NEGATIVE 08/26/2014 1854   LEUKOCYTESUR NEGATIVE 08/26/2014 1854    Coagulation profile  Recent Labs Lab 08/26/14 1504  INR 1.26     Recent Labs  08/26/14 1504  HGBA1C 5.9*     Imaging Studies:  Acute Abdominal Series  08/26/2014   CLINICAL DATA:  Left upper abdominal pain  EXAM: ACUTE ABDOMEN SERIES (ABDOMEN 2 VIEW & CHEST 1 VIEW)  COMPARISON:  CT abdomen 08/06/2014  FINDINGS: There is a relative paucity of bowel gas which may reflect decompressed bowel versus fluid-filled bowel. There are multiple surgical clips in the abdomen. No radiopaque calculi or other significant radiographic abnormality is seen. Heart size and mediastinal contours are within normal limits. Small left pleural effusion.  IMPRESSION: 1. There is a relative paucity of bowel gas which may reflect decompressed bowel versus fluid-filled bowel. If there is clinical concern regarding bowel obstruction, evaluation with CT of the abdomen/pelvis is recommended.   Electronically Signed   By: Kathreen Devoid   On: 08/26/2014 16:15    Assessment/Plan: 63 y.o.   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  CT abdomen and pelvis do not appear to have evidence of metastasis.  GI surgeon consultation obtained on 11/10, Dr. Marcello Moores for J tube placement. She is NPO to likely undergo procedure today To present her case in our GI tumor board next week. CT of chest results are pending. If no evidence of metastasis, the treatment plan will likely to be gastrectomy, adjuvant chemo and possible radiation.    Nausea, vomiting, and severe malnutrition  related her gastric cancer Continue IV antiemetics, IVF and PPIs  J-tube placement is recommended. Will need nutritional consultation  Orthostatic hypotension, secondary to dehydration On aggressive IV hydration  DVT prophylaxis On Lovenox  Remote history of history of  stage IIB Hodgkin Lymphoma  s/p metal field radiation and chemotherapy in 1977   Other medical issues as per admitting team     **Disclaimer: This note was dictated with voice recognition software. Similar sounding words can inadvertently be transcribed and this note may contain transcription errors which may not have been corrected upon publication of note.** WERTMAN,SARA E, PA-C 08/27/2014  9:08 AM   I have seen the patient, examined her. I agree with the assessment and and plan and have edited the notes. I saw her after her surgery today. She was in quite bit pain. I explained to her that Dr. Marcello Moores did perotomeal biopsy and sent ascite fluids for cytology. I will wait for the path report. If no distant metastasis,  Would consider EUS. Will follow her. Truitt Merle

## 2014-08-28 ENCOUNTER — Encounter (HOSPITAL_COMMUNITY): Payer: Self-pay | Admitting: General Surgery

## 2014-08-28 DIAGNOSIS — E43 Unspecified severe protein-calorie malnutrition: Secondary | ICD-10-CM | POA: Insufficient documentation

## 2014-08-28 LAB — GLUCOSE, CAPILLARY
GLUCOSE-CAPILLARY: 104 mg/dL — AB (ref 70–99)
GLUCOSE-CAPILLARY: 122 mg/dL — AB (ref 70–99)
Glucose-Capillary: 102 mg/dL — ABNORMAL HIGH (ref 70–99)

## 2014-08-28 MED ORDER — PROMETHAZINE HCL 25 MG/ML IJ SOLN
12.5000 mg | INTRAMUSCULAR | Status: DC | PRN
  Administered 2014-08-28 – 2014-08-30 (×6): 12.5 mg via INTRAVENOUS
  Filled 2014-08-28 (×6): qty 1

## 2014-08-28 MED ORDER — JEVITY 1.2 CAL PO LIQD: 1000.0000 mL | ORAL | Status: DC

## 2014-08-28 MED ORDER — OSMOLITE 1.2 CAL PO LIQD
1000.0000 mL | ORAL | Status: DC
  Administered 2014-08-28 – 2014-09-03 (×5): 1000 mL

## 2014-08-28 MED ORDER — FREE WATER
100.0000 mL | Freq: Three times a day (TID) | Status: DC
  Administered 2014-08-28 – 2014-09-09 (×35): 100 mL

## 2014-08-28 MED ORDER — POTASSIUM CHLORIDE 20 MEQ/15ML (10%) PO SOLN
40.0000 meq | Freq: Two times a day (BID) | ORAL | Status: AC
Start: 2014-08-28 — End: 2014-08-29
  Filled 2014-08-28 (×2): qty 30

## 2014-08-28 NOTE — Progress Notes (Signed)
INITIAL NUTRITION ASSESSMENT  DOCUMENTATION CODES Per approved criteria  -Severe malnutrition in the context of chronic illness  Pt meets criteria for severe MALNUTRITION in the context of chronic illness as evidenced by 20% body weight loss in < 6 months, PO intake <50% for > one month.   INTERVENTION: -Initiate Osmolite 1.2 @ 15 ml/hr via Jtube for 24 hours -Increase by 10 ml every 4 hours to goal rate of 65 ml/hr.  -Tube feeding regimen provides 1872 kcal (100% of needs), 87 grams of protein, and 1279 ml of H2O.  -For bolus feeding regimen on d/c; recommend Osmolite 1.2 at goal rate of six cans (237 ml) daily w/ 30 ml Pro-Stat once daily to provide 1810 kcal, 95 gram protein, 1170 ml H2O) -Monitor refeeding labs d/t prolonged period of sub-optimal nutrition and severe weight loss -RD to continue to monitor   NUTRITION DIAGNOSIS: Inadequate oral intake related to inability to eat as evidenced by NPO status.   Goal: TF to meet >/= 90% of their estimated nutrition needs    Monitor:  TF tolerance, total protein/energy intake, labs, weights, GI profile  Reason for Assessment: Enteral nutrition initiation/management consult/MST  63 y.o. female  Admitting Dx: Dehydration  ASSESSMENT: ARILYNN BLAKENEY is a 63 y.o. female with history of recently diagnosed gastric cancer, hypothyroidism, gastroesophageal reflux disease, hypertension, depression, prior history of Hodgkin's disease who presented at oncologist's office for evaluation. Patient was noted to be orthostatic and noted to be tachycardic and dehydrated and a such patient was sent to the hospital as a direct admission for further evaluation and management.  -Pt's husband endorsed an unintentional weight loss of 40 lb in 2 months; however previous medical records indicate 20 lbs since 06/2014, and 40 lbs since 04/2014 (20% body weight loss in < 6 months, severe for time frame) -Diet recall indicates pt with minimal intake for  past two months, largest meal in past 2 weeks with 1/2 grilled cheese sandwich and soup. Husband has been encouraging gatorade and fluids; however pt has been unable to tolerate any liquids or solids for past one week d/t nausea and emesis -Pt s/p Jtube placement -Surgery recommended trickle feeds for 24 hours. Will place recommendations for goal rate, and bolus feed recommendations to implement upon d/c. Nothing by mouth at this time. -Family concerned with loose stools and skin care. Relayed to RN family concerns, and RD will follow up for additional home TF nutrition concerns.  -K low, Phos/Mg WNL. Monitor with initiation of EN and replete as needed -Admitted dehydrated; monitor fluid needs   Height: Ht Readings from Last 1 Encounters:  08/26/14 5\' 6"  (1.676 m)    Weight: Wt Readings from Last 1 Encounters:  08/28/14 168 lb 8 oz (76.431 kg)    Ideal Body Weight: 130 lb  % Ideal Body Weight: 129%  Wt Readings from Last 10 Encounters:  08/28/14 168 lb 8 oz (76.431 kg)  08/26/14 167 lb 3.2 oz (75.841 kg)  08/24/14 161 lb (73.029 kg)  08/13/14 170 lb (77.111 kg)  08/02/14 175 lb (79.379 kg)  07/09/14 189 lb (85.73 kg)  04/23/14 205 lb (92.987 kg)  04/23/14 205 lb (92.987 kg)  03/05/14 207 lb 8 oz (94.121 kg)  08/05/13 205 lb (92.987 kg)    Usual Body Weight: 208 lb  % Usual Body Weight: 81%  BMI:  Body mass index is 27.21 kg/(m^2).  Estimated Nutritional Needs: Kcal: 1750-1950 Protein: 85-95 gram Fluid: >/= 2200 ml daily  Skin: WDL  Diet Order: Diet NPO time specified  EDUCATION NEEDS: -Education needs addressed   Intake/Output Summary (Last 24 hours) at 08/28/14 1051 Last data filed at 08/28/14 0458  Gross per 24 hour  Intake   2000 ml  Output   1150 ml  Net    850 ml    Last BM: 11/04   Labs:   Recent Labs Lab 08/24/14 1315 08/26/14 1504 08/27/14 0419  NA 144 138 144  K 3.5* 3.4* 3.2*  CL 100 97 107  CO2 23 21 21   BUN 18 15 14   CREATININE  0.90 0.79 0.70  CALCIUM 9.8 9.8 8.7  MG  --  1.7  --   PHOS  --  3.3  --   GLUCOSE 102* 101* 86    CBG (last 3)  No results for input(s): GLUCAP in the last 72 hours.  Scheduled Meds: . docusate sodium  100 mg Oral BID  . enoxaparin (LOVENOX) injection  40 mg Subcutaneous Q24H  . free water  100 mL Per Tube 3 times per day  . levothyroxine  37.5 mcg Intravenous QAC breakfast  . ondansetron (ZOFRAN) IV  16 mg Intravenous 3 times per day  . pantoprazole (PROTONIX) IV  40 mg Intravenous Q24H  . potassium chloride  40 mEq Oral BID    Continuous Infusions: . feeding supplement (OSMOLITE 1.2 CAL)      Past Medical History  Diagnosis Date  . 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 Hypothyroidism   . Obstructive sleep apnea 05/21/09    USES CPAP  . LIVER FUNCTION TESTS, ABNORMAL, HX OF 01/30/2009    Qualifier: Diagnosis of  By: Harrington Challenger, MD, [place]   . PONV (postoperative nausea and vomiting)   . Hypertension   . Depression   . GERD (gastroesophageal reflux disease)   . Leiomyoma 2015    Multiple small on ultrasound, largest 24 mm  . Hodgkin disease     HISTORY OF  . Stomach cancer     Past Surgical History  Procedure Laterality Date  . Splenectomy, total  1977  . Tonsillectomy    . Open reduction and internal fixation of tibial shaft fracture with ext into the plafond  03/29/07    Angelena Form, M.D. [facility])  . Dilation and curettage of uterus    . Colposcopy    . Gynecologic cryosurgery    . Thyroid lobectomy Left 03/14/2013    Dr Constance Holster  . Thyroidectomy N/A 03/14/2013    Procedure: LEFT THYROID LOBECTOMY WITH FROZEN SECTION;  Surgeon: Izora Gala, MD;  Location: [place];  Service: ENT;  Laterality: N/A;  . Hysteroscopy  2010    HYSTEROSCOPY,D&C FOR PMB AND ENDO POLYP  . Cesarean section  1979  . Laparoscopy N/A 08/27/2014    Procedure: Exploratory Laparoscopy,  abdominal biopsy and j tube placement;  Surgeon: Leighton Ruff, MD;  Location: [facility];  Service:  General;  Laterality: N/A;    Atlee Abide MS RD LDN Clinical Dietitian [phone]

## 2014-08-28 NOTE — Progress Notes (Signed)
Patient's CBG before tube feed begun was 104

## 2014-08-28 NOTE — Progress Notes (Signed)
[facility] PROGRESS NOTE Assessment/Plan: Nausea with vomiting: - Likely to gastric cancer,surgery place J-tube on 11.11.2015. - CT scan with contrast of the abdomen and pelvis on 08/06/2014 shows abnormal wall thickening in the distal exogenous intending into the stomach antrum.  - Increase zofran. - Ascities fluid analysis for metastatic lesion pending. - CT chest showed sub-carina lymph node.  Dehydration - due to #1, improving with IV fluids. - Change fluids to potassium supplements.  Gastric cancer - Recently diagnosed with  nausea and vomiting for details. - CT scan of the abdomen and pelvis does not appear to have evidence of metastases, CT scan of the chest with contrast Bilateral effusions with bibasilar atelectatic change. Focal area of infiltrate in the medial left apex with air bronchograms in this Area. She has remained afebrile no leukocytosis.  Severe protein caloric malnutrition: - NJ tube get a nutrition consult.  Orthostasis Hypotension: - resolved. - continue aggressive fluid hydration.  Hypothyroidism Check a TSH. Will place on IV Synthroid.  Gastroesophageal reflux disease: PPI.  Obstructive sleep apnea  Depression: Stable.     Code Status: Full DVT Prophylaxis: Lovenox Family Communication: Updated patient, no family present. Disposition Plan: Admit to [facility]   Consultants:  Onco  Procedures:  J tube  Antibiotics:  none  HPI/Subjective: Nausea improved.  Objective: Filed Vitals:   08/27/14 2120 08/27/14 2330 08/28/14 0330 08/28/14 0439  BP: 124/75   115/69  Pulse: 95 90  90  Temp: 97.8 F (36.6 C)   97.7 F (36.5 C)  TempSrc: Oral   Oral  Resp: 12     Height:      Weight:    76.431 kg (168 lb 8 oz)  SpO2: 95% 96% 98% 96%    Intake/Output Summary (Last 24 hours) at 08/28/14 1031 Last data filed at 08/28/14 0458  Gross per 24 hour  Intake   2000 ml  Output   1150 ml  Net    850 ml   Filed Weights   08/26/14 1300 08/27/14 0421 08/28/14 0439  Weight: 75.751 kg (167 lb) 78.245 kg (172 lb 8 oz) 76.431 kg (168 lb 8 oz)    Exam:  General: Alert, awake, oriented x3, in no acute distress.  HEENT: No bruits, no goiter.  Heart: Regular rate and rhythm. Lungs: Good air movement, clear Abdomen: Soft, nontender, nondistended, positive bowel sounds.    Data Reviewed: Basic Metabolic Panel:  Recent Labs Lab 08/24/14 1315 08/26/14 1504 08/27/14 0419  NA 144 138 144  K 3.5* 3.4* 3.2*  CL 100 97 107  CO2 23 21 21   GLUCOSE 102* 101* 86  BUN 18 15 14   CREATININE 0.90 0.79 0.70  CALCIUM 9.8 9.8 8.7  MG  --  1.7  --   PHOS  --  3.3  --    Liver Function Tests:  Recent Labs Lab 08/26/14 1504  AST 16  ALT 10  ALKPHOS 85  BILITOT 0.7  PROT 7.4  ALBUMIN 3.3*   No results for input(s): LIPASE, AMYLASE in the last 168 hours. No results for input(s): AMMONIA in the last 168 hours. CBC:  Recent Labs Lab 08/24/14 1315 08/26/14 1504 08/27/14 0419  WBC 12.3* 12.5* 8.6  NEUTROABS 9.9* 10.1*  --   HGB 14.6 13.2 12.0  HCT 43.2 39.4 35.6*  MCV 97.7 96.1 96.7  PLT 374 384 356   Cardiac Enzymes: No results for input(s): CKTOTAL, CKMB, CKMBINDEX, TROPONINI in the last 168 hours. BNP (last 3 results) No  results for input(s): PROBNP in the last 8760 hours. CBG: No results for input(s): GLUCAP in the last 168 hours.  Recent Results (from the past 240 hour(s))  Urine culture     Status: None   Collection Time: 08/26/14  6:54 PM  Result Value Ref Range Status   Specimen Description URINE, RANDOM  Final   Special Requests NONE  Final   Culture  Setup Time   Final    08/26/2014 22:23 Performed at [facility]   Final    >=100,000 COLONIES/ML Performed at [facility]    Culture   Final    Multiple bacterial morphotypes present, none predominant. Suggest appropriate recollection if clinically indicated. Performed at [facility]     Report Status 08/27/2014 FINAL  Final     Studies: Ct Chest W Contrast  08/27/2014   CLINICAL DATA:  Recent diagnosis of gastric carcinoma. Currently with fatigue  EXAM: CT CHEST WITH CONTRAST  TECHNIQUE: Multidetector CT imaging of the chest was performed during intravenous contrast administration.  CONTRAST:  26mL OMNIPAQUE IOHEXOL 300 MG/ML  SOLN  COMPARISON:  Chest radiograph August 26, 2014; CT abdomen and pelvis August 06, 2014  FINDINGS: There are moderate pleural effusions bilaterally with patchy atelectasis in both lung bases. There is a moderate pericardial effusion. There is focal consolidation in the medial left apex. There is no appreciable parenchymal lung mass.  There are small mediastinal lymph nodes at several sites. There is a precarinal lymph node measuring 1.6 x 1.2 cm. There is a sub- carinal lymph node measuring 1.6 by 1.1 cm. Several smaller lymph nodes are also present. There is atherosclerotic change in the aorta but no aneurysm or dissection. No pulmonary embolus appreciable.  There is generalized dilatation of the esophagus.  In the visualized upper abdomen, there is ascites noted, apparently new compared to prior study. There is mesenteric thickening in the left abdomen, new from prior study. Extensive wall thickening throughout the stomach persists. Multiple surgical clips in the upper abdomen remain. There is atherosclerotic change in the aorta.  There are no blastic or lytic bone lesions. There is a 7 x 7 mm nodular lesion in the right lobe of the thyroid. The left lobe of the thyroid is essentially completely absent.  IMPRESSION: Bilateral effusions with bibasilar atelectatic change. Focal area of infiltrate in the medial left apex with air bronchograms in this area.  Moderate pericardial effusion.  Mild lymph node prominence in the precarinal and subcarinal regions.  Small nodular lesion in the right lobe of the thyroid. This finding may warrant nonemergent thyroid  ultrasound to further assess.  Persistent gastric wall thickening. There is new inflammatory change in the mid abdomen, particularly surrounding the third portion of the duodenum. There is also mild ascites. Given this change compared to prior studies, it may be prudent to obtain complete abdomen and pelvis CT to further assess. No free air or portal venous air is seen. Multiple surgical clips are noted in the upper abdomen, stable.  These results will be called to the ordering clinician or representative by the Radiologist Assistant, and communication documented in the PACS or zVision Dashboard.   Electronically Signed   By: Lowella Grip M.D.   On: 08/27/2014 11:17   Acute Abdominal Series  08/26/2014   CLINICAL DATA:  Left upper abdominal pain  EXAM: ACUTE ABDOMEN SERIES (ABDOMEN 2 VIEW & CHEST 1 VIEW)  COMPARISON:  CT abdomen 08/06/2014  FINDINGS: There  is a relative paucity of bowel gas which may reflect decompressed bowel versus fluid-filled bowel. There are multiple surgical clips in the abdomen. No radiopaque calculi or other significant radiographic abnormality is seen. Heart size and mediastinal contours are within normal limits. Small left pleural effusion.  IMPRESSION: 1. There is a relative paucity of bowel gas which may reflect decompressed bowel versus fluid-filled bowel. If there is clinical concern regarding bowel obstruction, evaluation with CT of the abdomen/pelvis is recommended.   Electronically Signed   By: Kathreen Devoid   On: 08/26/2014 16:15    Scheduled Meds: . docusate sodium  100 mg Oral BID  . enoxaparin (LOVENOX) injection  40 mg Subcutaneous Q24H  . levothyroxine  37.5 mcg Intravenous QAC breakfast  . ondansetron (ZOFRAN) IV  16 mg Intravenous 3 times per day  . pantoprazole (PROTONIX) IV  40 mg Intravenous Q24H   Continuous Infusions: . dextrose 5 % and 0.45% NaCl 1,000 mL with potassium chloride 40 mEq infusion 100 mL/hr at 08/28/14 0444  . feeding supplement  (OSMOLITE 1.2 CAL)       Charlynne Cousins  [facility] Pager [phone]. If 8PM-8AM, please contact night-coverage at [email or web address], password [facility] 08/28/2014, 10:31 AM  LOS: 2 days

## 2014-08-28 NOTE — Progress Notes (Signed)
1 Day Post-Op  Subjective: She looks fine, a bit sore and tender after surgery.    Objective: Vital signs in last 24 hours: Temp:  [97.4 F (36.3 C)-98.2 F (36.8 C)] 97.7 F (36.5 C) (11/12 0439) Pulse Rate:  [90-102] 90 (11/12 0439) Resp:  [10-17] 12 (11/11 2120) BP: (115-155)/(61-79) 115/69 mmHg (11/12 0439) SpO2:  [87 %-100 %] 96 % (11/12 0439) Weight:  [76.431 kg (168 lb 8 oz)] 76.431 kg (168 lb 8 oz) (11/12 0439) Last BM Date: 08/20/14 AFEBRILE, HR up some, VSS No labs  Intake/Output from previous day: 11/11 0701 - 11/12 0700 In: 2000 [I.V.:2000] Out: 1300 [Urine:1000; Blood:300] Intake/Output this shift:    General appearance: alert, cooperative and no distress GI: sore, port sites and abdominal wound, J tube site look fine.  Lab Results:   Recent Labs  08/26/14 1504 08/27/14 0419  WBC 12.5* 8.6  HGB 13.2 12.0  HCT 39.4 35.6*  PLT 384 356    BMET  Recent Labs  08/26/14 1504 08/27/14 0419  NA 138 144  K 3.4* 3.2*  CL 97 107  CO2 21 21  GLUCOSE 101* 86  BUN 15 14  CREATININE 0.79 0.70  CALCIUM 9.8 8.7   PT/INR  Recent Labs  08/26/14 1504  LABPROT 15.9*  INR 1.26     Recent Labs Lab 08/26/14 1504  AST 16  ALT 10  ALKPHOS 85  BILITOT 0.7  PROT 7.4  ALBUMIN 3.3*     Lipase  No results found for: LIPASE   Studies/Results: Ct Chest W Contrast  08/27/2014   CLINICAL DATA:  Recent diagnosis of gastric carcinoma. Currently with fatigue  EXAM: CT CHEST WITH CONTRAST  TECHNIQUE: Multidetector CT imaging of the chest was performed during intravenous contrast administration.  CONTRAST:  61mL OMNIPAQUE IOHEXOL 300 MG/ML  SOLN  COMPARISON:  Chest radiograph August 26, 2014; CT abdomen and pelvis August 06, 2014  FINDINGS: There are moderate pleural effusions bilaterally with patchy atelectasis in both lung bases. There is a moderate pericardial effusion. There is focal consolidation in the medial left apex. There is no appreciable  parenchymal lung mass.  There are small mediastinal lymph nodes at several sites. There is a precarinal lymph node measuring 1.6 x 1.2 cm. There is a sub- carinal lymph node measuring 1.6 by 1.1 cm. Several smaller lymph nodes are also present. There is atherosclerotic change in the aorta but no aneurysm or dissection. No pulmonary embolus appreciable.  There is generalized dilatation of the esophagus.  In the visualized upper abdomen, there is ascites noted, apparently new compared to prior study. There is mesenteric thickening in the left abdomen, new from prior study. Extensive wall thickening throughout the stomach persists. Multiple surgical clips in the upper abdomen remain. There is atherosclerotic change in the aorta.  There are no blastic or lytic bone lesions. There is a 7 x 7 mm nodular lesion in the right lobe of the thyroid. The left lobe of the thyroid is essentially completely absent.  IMPRESSION: Bilateral effusions with bibasilar atelectatic change. Focal area of infiltrate in the medial left apex with air bronchograms in this area.  Moderate pericardial effusion.  Mild lymph node prominence in the precarinal and subcarinal regions.  Small nodular lesion in the right lobe of the thyroid. This finding may warrant nonemergent thyroid ultrasound to further assess.  Persistent gastric wall thickening. There is new inflammatory change in the mid abdomen, particularly surrounding the third portion of the duodenum. There is also  mild ascites. Given this change compared to prior studies, it may be prudent to obtain complete abdomen and pelvis CT to further assess. No free air or portal venous air is seen. Multiple surgical clips are noted in the upper abdomen, stable.  These results will be called to the ordering clinician or representative by the Radiologist Assistant, and communication documented in the PACS or zVision Dashboard.   Electronically Signed   By: Lowella Grip M.D.   On: 08/27/2014 11:17    Acute Abdominal Series  08/26/2014   CLINICAL DATA:  Left upper abdominal pain  EXAM: ACUTE ABDOMEN SERIES (ABDOMEN 2 VIEW & CHEST 1 VIEW)  COMPARISON:  CT abdomen 08/06/2014  FINDINGS: There is a relative paucity of bowel gas which may reflect decompressed bowel versus fluid-filled bowel. There are multiple surgical clips in the abdomen. No radiopaque calculi or other significant radiographic abnormality is seen. Heart size and mediastinal contours are within normal limits. Small left pleural effusion.  IMPRESSION: 1. There is a relative paucity of bowel gas which may reflect decompressed bowel versus fluid-filled bowel. If there is clinical concern regarding bowel obstruction, evaluation with CT of the abdomen/pelvis is recommended.   Electronically Signed   By: Kathreen Devoid   On: 08/26/2014 16:15    Medications: . docusate sodium  100 mg Oral BID  . enoxaparin (LOVENOX) injection  40 mg Subcutaneous Q24H  . fluticasone  1 spray Each Nare Daily  . levothyroxine  37.5 mcg Intravenous QAC breakfast  . ondansetron (ZOFRAN) IV  16 mg Intravenous 3 times per day  . pantoprazole (PROTONIX) IV  40 mg Intravenous Q24H   . dextrose 5 % and 0.45% NaCl 1,000 mL with potassium chloride 40 mEq infusion 100 mL/hr at 08/28/14 0444   Prior to Admission medications   Medication Sig Start Date End Date Taking? Authorizing Provider  Dexlansoprazole (DEXILANT) 30 MG capsule Take 30 mg by mouth daily.   Yes Historical Provider, MD  diltiazem (CARDIZEM CD) 120 MG 24 hr capsule Take 120 mg by mouth daily.   Yes Historical Provider, MD  levothyroxine (SYNTHROID, LEVOTHROID) 75 MCG tablet Take 75 mcg by mouth daily before breakfast.   Yes Historical Provider, MD  ondansetron (ZOFRAN-ODT) 4 MG disintegrating tablet Take 4 mg by mouth every 8 (eight) hours as needed for nausea or vomiting.   Yes Historical Provider, MD  promethazine (PHENERGAN) 25 MG suppository Place 25 mg rectally every 6 (six) hours as needed for  nausea or vomiting.   Yes Historical Provider, MD  diltiazem (CARDIZEM CD) 120 MG 24 hr capsule TAKE ONE CAPSULE BY MOUTH EVERY DAY Patient not taking: Reported on 08/26/2014 08/18/14   Fay Records, MD  fluticasone (FLONASE) 50 MCG/ACT nasal spray USE 1 SPRAY INTO THE NOSE DAILY. Patient not taking: Reported on 08/26/2014 10/18/13   Lucretia Kern, DO  levothyroxine (SYNTHROID, LEVOTHROID) 75 MCG tablet TAKE 1 TABLET EVERY DAY Patient not taking: Reported on 08/26/2014 06/30/14   Lucretia Kern, DO    Assessment/Plan Gastric cancer with dehydration and malnutrition S/p Exploratory Laparoscopy, Peritoneal biopsy and j tube placement, 94/76/5465, Leighton Ruff, MD Hx of Hodgkins Hypothyroid Hx of OSA Hypertension   Plan:  From our standpoint you can start trickle tube feeds, no more than 10-15 ml per hour today.  Be sure she does not get any pills crushed or otherwise thru the tube.  She can mobilize. Path reports pending.  LOS: 2 days    Avalene Sealy 08/28/2014

## 2014-08-28 NOTE — Care Management Note (Unsigned)
    Page 1 of 2   08/28/2014     3:32:30 PM CARE MANAGEMENT NOTE 08/28/2014  Patient:  SHAKEMA, SURITA   Account Number:  [id number]  Date Initiated:  08/27/2014  Documentation initiated by:  Marney Doctor  Subjective/Objective Assessment:   63 yo admitted with Dehydration.  Hx of  recently diagnosed gastric cancer, hypothyroidism, gastroesophageal reflux disease, hypertension, depression, prior history of Hodgkin's disease     Action/Plan:   From home with spouse   Anticipated DC Date:  08/30/2014   Anticipated DC Plan:  [place]  CM consult      [facility] Choice  HOME HEALTH   Choice offered to / List presented to:  C-1 Patient        [place] arranged  HH-1 RN      [place].   Status of service:  In process, will continue to follow Medicare Important Message given?   (If response is "NO", the following Medicare IM given date fields will be blank) Date Medicare IM given:   Medicare IM given by:   Date Additional Medicare IM given:   Additional Medicare IM given by:    Discharge Disposition:    Per UR Regulation:  Reviewed for med. necessity/level of care/duration of stay  If discussed at [place] of Stay Meetings, dates discussed:    Comments:  08/28/14 15:00 CM met with pt in room in anticipation of HHRN need for new J tube and feedings.  CM spoke with Nutritionist Clarise Cruz who states pt will be going home with bolus feedings.  Pt states she is ambulatory and does not feel she will need andy PT/OT.  Pt has the support of her Truett Main[place] [phone].Marland Kitchen  Referral called to [facility] rep, Kristen.  Request for [facility] orders and Enteral orders made by CM (sticky and RN ).  Will continue to monitor for any add on services for [facility].  Mariane Masters, BSN, [place].  08/27/14 Marney Doctor RN,BSN,NCM [phone] Chart reviewed and CM following for DC needs.  Pt to OR today for staging laparoscopy, possible biopsy and  J tube placement.

## 2014-08-29 ENCOUNTER — Telehealth: Payer: Self-pay | Admitting: Hematology

## 2014-08-29 DIAGNOSIS — C169 Malignant neoplasm of stomach, unspecified: Secondary | ICD-10-CM

## 2014-08-29 LAB — BASIC METABOLIC PANEL
Anion gap: 12 (ref 5–15)
BUN: 7 mg/dL (ref 6–23)
CALCIUM: 8.3 mg/dL — AB (ref 8.4–10.5)
CHLORIDE: 102 meq/L (ref 96–112)
CO2: 26 meq/L (ref 19–32)
Creatinine, Ser: 0.73 mg/dL (ref 0.50–1.10)
GFR calc Af Amer: >90 mL/min (ref >90)
GFR calc non Af Amer: 90 mL/min — ABNORMAL LOW (ref >90)
Glucose, Bld: 134 mg/dL — ABNORMAL HIGH (ref 70–99)
Potassium: 3.6 mEq/L — ABNORMAL LOW (ref 3.7–5.3)
SODIUM: 140 meq/L (ref 137–147)

## 2014-08-29 LAB — GLUCOSE, CAPILLARY
GLUCOSE-CAPILLARY: 111 mg/dL — AB (ref 70–99)
GLUCOSE-CAPILLARY: 113 mg/dL — AB (ref 70–99)
GLUCOSE-CAPILLARY: 125 mg/dL — AB (ref 70–99)
Glucose-Capillary: 104 mg/dL — ABNORMAL HIGH (ref 70–99)
Glucose-Capillary: 111 mg/dL — ABNORMAL HIGH (ref 70–99)
Glucose-Capillary: 117 mg/dL — ABNORMAL HIGH (ref 70–99)
Glucose-Capillary: 121 mg/dL — ABNORMAL HIGH (ref 70–99)

## 2014-08-29 MED ORDER — CEFAZOLIN SODIUM-DEXTROSE 2-3 GM-% IV SOLR
2.0000 g | INTRAVENOUS | Status: DC
  Filled 2014-08-29: qty 50

## 2014-08-29 MED ORDER — ENOXAPARIN SODIUM 40 MG/0.4ML ~~LOC~~ SOLN
40.0000 mg | SUBCUTANEOUS | Status: DC
  Administered 2014-08-29 – 2014-09-08 (×10): 40 mg via SUBCUTANEOUS
  Filled 2014-08-29 (×12): qty 0.4

## 2014-08-29 NOTE — Consult Note (Signed)
Reason for consult: port a cath placement  Referring Physician(s): Dr. Burr Medico  History of Present Illness: Sabrina Melton is a 63 y.o. female with remote history of Hodgkin's disease and now recently diagnosed with metastatic gastric carcinoma. She is s/p peritoneal bx/J tube placement on 11/11 by [facility]. She has OSA and uses CPAP. Request now received for port a cath placement for chemotherapy.    Past Medical History  Diagnosis Date  . 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 Hypothyroidism   . Obstructive sleep apnea 05/21/09    USES CPAP  . LIVER FUNCTION TESTS, ABNORMAL, HX OF 01/30/2009    Qualifier: Diagnosis of  By: Harrington Challenger, MD, [place] Flood   . PONV (postoperative nausea and vomiting)   . Hypertension   . Depression   . GERD (gastroesophageal reflux disease)   . Leiomyoma 2015    Multiple small on ultrasound, largest 24 mm  . Hodgkin disease     HISTORY OF  . Stomach cancer     Past Surgical History  Procedure Laterality Date  . Splenectomy, total  1977  . Tonsillectomy    . Open reduction and internal fixation of tibial shaft fracture with ext into the plafond  03/29/07    Angelena Form, M.D. [facility])  . Dilation and curettage of uterus    . Colposcopy    . Gynecologic cryosurgery    . Thyroid lobectomy Left 03/14/2013    Dr Constance Holster  . Thyroidectomy N/A 03/14/2013    Procedure: LEFT THYROID LOBECTOMY WITH FROZEN SECTION;  Surgeon: Izora Gala, MD;  Location: [place];  Service: ENT;  Laterality: N/A;  . Hysteroscopy  2010    HYSTEROSCOPY,D&C FOR PMB AND ENDO POLYP  . Cesarean section  1979  . Laparoscopy N/A 08/27/2014    Procedure: Exploratory Laparoscopy,  abdominal biopsy and j tube placement;  Surgeon: Leighton Ruff, MD;  Location: [facility];  Service: General;  Laterality: N/A;    Allergies: Review of patient's allergies indicates no known allergies.  Medications: Prior to Admission medications   Medication Sig Start Date End Date Taking? Authorizing  Provider  Dexlansoprazole (DEXILANT) 30 MG capsule Take 30 mg by mouth daily.   Yes Historical Provider, MD  diltiazem (CARDIZEM CD) 120 MG 24 hr capsule Take 120 mg by mouth daily.   Yes Historical Provider, MD  levothyroxine (SYNTHROID, LEVOTHROID) 75 MCG tablet Take 75 mcg by mouth daily before breakfast.   Yes Historical Provider, MD  ondansetron (ZOFRAN-ODT) 4 MG disintegrating tablet Take 4 mg by mouth every 8 (eight) hours as needed for nausea or vomiting.   Yes Historical Provider, MD  promethazine (PHENERGAN) 25 MG suppository Place 25 mg rectally every 6 (six) hours as needed for nausea or vomiting.   Yes Historical Provider, MD  diltiazem (CARDIZEM CD) 120 MG 24 hr capsule TAKE ONE CAPSULE BY MOUTH EVERY DAY Patient not taking: Reported on 08/26/2014 08/18/14   Fay Records, MD  fluticasone (FLONASE) 50 MCG/ACT nasal spray USE 1 SPRAY INTO THE NOSE DAILY. Patient not taking: Reported on 08/26/2014 10/18/13   Lucretia Kern, DO  levothyroxine (SYNTHROID, LEVOTHROID) 75 MCG tablet TAKE 1 TABLET EVERY DAY Patient not taking: Reported on 08/26/2014 06/30/14   Lucretia Kern, DO    Family History  Problem Relation Age of Onset  . Breast cancer Maternal Grandmother   . Hyperlipidemia Father     History   Social History  . Marital Status: Married    Spouse Name:  N/A    Number of Children: 1  . Years of Education: N/A   Occupational History  . Dental Assistant    Social History Main Topics  . Smoking status: Former Smoker -- 0.25 packs/day for 40 years    Types: Cigarettes    Quit date: 03/02/2007  . Smokeless tobacco: Never Used  . Alcohol Use: Yes     Comment: 1-2 drink a few times per week  . Drug Use: No  . Sexual Activity: Yes    Birth Control/ Protection: Post-menopausal   Other Topics Concern  . None   Social History Narrative   Work or School: Personal assistant Situation: lives with husband      Spiritual Beliefs: Christian      Lifestyle: no regular  exercise, diet is poor                 Review of Systems   Constitutional: Positive for appetite change, fatigue and unexpected weight change. Negative for fever and chills.  Respiratory: Negative for shortness of breath.        Occ cough  Cardiovascular: Negative for chest pain.  Gastrointestinal: Positive for nausea and abdominal pain. Negative for vomiting and blood in stool.  Genitourinary: Negative for dysuria and hematuria.  Musculoskeletal: Positive for back pain.  Neurological: Negative for headaches.  Hematological: Does not bruise/bleed easily.    Vital Signs: BP 136/73 mmHg  Pulse 102  Temp(Src) 98.2 F (36.8 C) (Oral)  Resp 16  Ht 5\' 6"  (1.676 m)  Wt 168 lb (76.204 kg)  BMI 27.13 kg/m2  SpO2 92%  Physical Exam  Constitutional: She is oriented to person, place, and time. She appears well-developed and well-nourished.  Cardiovascular:  Sl tachy but regular  Pulmonary/Chest: Effort normal.  Dim BS bases  Abdominal: Soft. There is tenderness.  Clean, intact J tube  Musculoskeletal: Normal range of motion. She exhibits no edema.  Neurological: She is alert and oriented to person, place, and time.    Imaging: Dg Abd 1 View  08/06/2014   CLINICAL DATA:  Constipation. Check for retained barium before CT. Initial encounter.  EXAM: ABDOMEN - 1 VIEW  COMPARISON:  08/02/2014  FINDINGS: Interval complete clearance of barium from the colon. Bowel gas pattern is nonobstructive. No concerning intra-abdominal mass effect or calcification. An abdominal CT is pending. Surgical clips distributed throughout the bilateral and central abdomen.  IMPRESSION: Complete clearance of colonic barium.   Electronically Signed   By: Jorje Guild M.D.   On: 08/06/2014 10:53   Dg Abd 1 View  08/02/2014   CLINICAL DATA:  The patient had a barium swallow transient barium swallow 07/22/2014. The patient now complains of constipation vomiting.  EXAM: ABDOMEN - 1 VIEW  COMPARISON:  Single  view of the abdomen 07/30/2014.  FINDINGS: A small volume of contrast from the patient's barium swallow remains in the transverse and proximal descending colon. There is no small bowel obstruction. Multiple surgical clips project the abdomen. No focal bony abnormality.  IMPRESSION: Small residual volume of contrast in the colon. The patient does not appear constipated.  No acute finding.   Electronically Signed   By: Inge Rise M.D.   On: 08/02/2014 11:24   Ct Chest W Contrast  08/27/2014   CLINICAL DATA:  Recent diagnosis of gastric carcinoma. Currently with fatigue  EXAM: CT CHEST WITH CONTRAST  TECHNIQUE: Multidetector CT imaging of the chest was performed during intravenous contrast administration.  CONTRAST:  75mL OMNIPAQUE IOHEXOL 300 MG/ML  SOLN  COMPARISON:  Chest radiograph August 26, 2014; CT abdomen and pelvis August 06, 2014  FINDINGS: There are moderate pleural effusions bilaterally with patchy atelectasis in both lung bases. There is a moderate pericardial effusion. There is focal consolidation in the medial left apex. There is no appreciable parenchymal lung mass.  There are small mediastinal lymph nodes at several sites. There is a precarinal lymph node measuring 1.6 x 1.2 cm. There is a sub- carinal lymph node measuring 1.6 by 1.1 cm. Several smaller lymph nodes are also present. There is atherosclerotic change in the aorta but no aneurysm or dissection. No pulmonary embolus appreciable.  There is generalized dilatation of the esophagus.  In the visualized upper abdomen, there is ascites noted, apparently new compared to prior study. There is mesenteric thickening in the left abdomen, new from prior study. Extensive wall thickening throughout the stomach persists. Multiple surgical clips in the upper abdomen remain. There is atherosclerotic change in the aorta.  There are no blastic or lytic bone lesions. There is a 7 x 7 mm nodular lesion in the right lobe of the thyroid. The left lobe  of the thyroid is essentially completely absent.  IMPRESSION: Bilateral effusions with bibasilar atelectatic change. Focal area of infiltrate in the medial left apex with air bronchograms in this area.  Moderate pericardial effusion.  Mild lymph node prominence in the precarinal and subcarinal regions.  Small nodular lesion in the right lobe of the thyroid. This finding may warrant nonemergent thyroid ultrasound to further assess.  Persistent gastric wall thickening. There is new inflammatory change in the mid abdomen, particularly surrounding the third portion of the duodenum. There is also mild ascites. Given this change compared to prior studies, it may be prudent to obtain complete abdomen and pelvis CT to further assess. No free air or portal venous air is seen. Multiple surgical clips are noted in the upper abdomen, stable.  These results will be called to the ordering clinician or representative by the Radiologist Assistant, and communication documented in the PACS or zVision Dashboard.   Electronically Signed   By: Lowella Grip M.D.   On: 08/27/2014 11:17   Ct Abdomen Pelvis W Contrast  08/06/2014   CLINICAL DATA:  Abnormal weight loss. A barium swallow showed a dilated distal esophagus suggesting achalasia. Apparently the patient had a recent upper endoscopy, although I cannot find the report for this in the epic system. Reportedly it showed some kind of mass. Without the biopsy results without being able to find with the record of the endoscopy, I am uncertain exactly what was seen.  EXAM: CT ABDOMEN AND PELVIS WITH CONTRAST  TECHNIQUE: Multidetector CT imaging of the abdomen and pelvis was performed using the standard protocol following bolus administration of intravenous contrast.  CONTRAST:  143mL OMNIPAQUE IOHEXOL 300 MG/ML  SOLN  COMPARISON:  Multiple exams, including 07/22/2014  FINDINGS: Lower chest: Dilated but tapering distal esophagus noted. Wall thickening in the distal esophagus.   Small left pleural effusion. Trace pericardial fluid. Calcified mitral valve.  Hepatobiliary: 4 mm hypodense lesion in segment 2 of the liver, image 11 series 3. Mildly contracted gallbladder.  Pancreas: Unremarkable  Spleen: Splenectomy  Adrenals/Urinary Tract: Scarring of the left kidney upper pole.  Stomach/Bowel: The stomach is nondistended given the entire gastric wall a thickened appearance. Stomach antrum obscured by streak artifact from dense clips. Potential wall thickening in the gastric cardia confluent with the distal esophagus. Retroperitoneal clips in  clips adjacent to the descending colon noted.  Vascular/Lymphatic: Aortoiliac atherosclerotic vascular disease. Periaortic stranding without well-defined periaortic adenopathy.  Reproductive: Uterine heterogeneity of uncertain significance. According to the patient's medical records she head a hysteroscopic resection of an endometrial polyp and submucosal myoma on 07/09/2014.  Other: Small but abnormal amount of free pelvic fluid.  Musculoskeletal: Mild degenerative arthropathy of both hips. Lower lumbar degenerative facet arthropathy with grade 1 anterolisthesis at L4-5.  IMPRESSION: 1. Abnormal wall thickening in the distal esophagus extending into the stomach antrum. The stomach is nondistended the entire gastric wall looks somewhat thickened. Gastritis could have this appearance. A mass of the gastroesophageal junction is not readily excluded. Unfortunately edspiet searching around through the EMR I can locate none of the records related to the patient's recent endoscopy. Correlation with the pathology results from the presumably outside endoscopy is recommended. 2. Gastrohepatic ligament lymph node 0.7 cm in short axis. Part of the stomach is obscured by streak artifact from adjacent high density clips. 3. Numerous ancillary findings including atherosclerosis, small left pleural effusion, trace pericardial fluid, postoperative findings in the uterus,  degenerative arthropathy of the hips, lower lumbar facet arthropathy, and a small but abnormal amount of free pelvic fluid. Strictly speaking given the hypodensity in the anterior uterine body and free pelvic fluid I cannot exclude uterine perforation ; if there is concern for such based on clinical measures then ultrasound or sonohysterography would be suggested.   Electronically Signed   By: Sherryl Barters M.D.   On: 08/06/2014 13:55   Acute Abdominal Series  08/26/2014   CLINICAL DATA:  Left upper abdominal pain  EXAM: ACUTE ABDOMEN SERIES (ABDOMEN 2 VIEW & CHEST 1 VIEW)  COMPARISON:  CT abdomen 08/06/2014  FINDINGS: There is a relative paucity of bowel gas which may reflect decompressed bowel versus fluid-filled bowel. There are multiple surgical clips in the abdomen. No radiopaque calculi or other significant radiographic abnormality is seen. Heart size and mediastinal contours are within normal limits. Small left pleural effusion.  IMPRESSION: 1. There is a relative paucity of bowel gas which may reflect decompressed bowel versus fluid-filled bowel. If there is clinical concern regarding bowel obstruction, evaluation with CT of the abdomen/pelvis is recommended.   Electronically Signed   By: Kathreen Devoid   On: 08/26/2014 16:15    Labs:  CBC:  Recent Labs  08/13/14 1604 08/24/14 1315 08/26/14 1504 08/27/14 0419  WBC 9.5 12.3* 12.5* 8.6  HGB 13.4 14.6 13.2 12.0  HCT 40.5 43.2 39.4 35.6*  PLT 359 374 384 356    COAGS:  Recent Labs  08/26/14 1504  INR 1.26  APTT 29    BMP:  Recent Labs  08/24/14 1315 08/26/14 1504 08/27/14 0419 08/29/14 0511  NA 144 138 144 140  K 3.5* 3.4* 3.2* 3.6*  CL 100 97 107 102  CO2 23 21 21 26   GLUCOSE 102* 101* 86 134*  BUN 18 15 14 7   CALCIUM 9.8 9.8 8.7 8.3*  CREATININE 0.90 0.79 0.70 0.73  GFRNONAA 67* 87* >90 90*  GFRAA 78* >90 >90 >90    LIVER FUNCTION TESTS:  Recent Labs  07/08/14 1130 08/26/14 1504  BILITOT 0.4 0.7  AST  50* 16  ALT 58* 10  ALKPHOS 88 85  PROT 7.4 7.4  ALBUMIN 3.8 3.3*    TUMOR MARKERS: No results for input(s): AFPTM, CEA, CA199, CHROMGRNA in the last 8760 hours.  Assessment and Plan: Sabrina Melton is a 63 y.o. female with  remote history of Hodgkin's disease and now recently diagnosed with metastatic gastric carcinoma. She is s/p peritoneal bx/J tube placement on 11/11 by [facility]. She has OSA and uses CPAP. Request now received for port a cath placement for chemotherapy. Bilateral pleural effusions/pericardial effusions noted on latest CT. Will monitor CXR and tent plan port a cath placement for 11/16. Details/risks of procedure d/w pt with her understanding and consent. Drs. Feng/Ortiz aware of plans.        I spent a total of 20 minutes face to face in clinical consultation, greater than 50% of which was counseling/coordinating care for port a cath placement.  Signed: Autumn Messing 08/29/2014, 3:27 PM

## 2014-08-29 NOTE — Progress Notes (Signed)
NUTRITION FOLLOW UP  Intervention:   -Continue Osmolite 1.2 at trickle feeds of 15 ml/hr for next 24 hours per surgery; consider advancement on 11/14. -Due to risk of refeeding, recommend conservative advancement of Osmolite 1.2 by 10 ml every 12 hours to goal rate of 65 ml/hr -Recommend refeeding labs be run when Osmolite 1.2 at 30 ml/hr, and replete as needed. Tube feeding advancement regimen may be adjusted pending results -D/t jejunal tube placement, pt would not benefit from bolus feeding schedule; will require cyclic continuous tube feeding -When stable for d/c; recommend:  -Home regimen:Initiate Osmolite 1.2 at 65 ml/hr at 7pm, advance by 10 ml every 4 hours until goal rate of 120 ml/hr achieved. Hold tube feeding, and restart next scheduled feed at 120 ml/hr. Run continuous for 13 hours to meet estimated nutrition needs.  -Osmolite 1.2 at goal rate of 120 ml/hr for 13 hours (7pm-8am) via Jtube to provide 1872 kcal (100% est kcal need), 87 gram protein (100% est protein needs), and 1279 ml free H2O. Will require additional 920 ml free water via flushes (approximately 230 ml (8 oz) four times daily)  Nutrition Dx:   Inadequate oral intake related to inability to eat as evidenced by NPO status; ongoing  Goal:   TF to meet >/= 90% of their estimated nutrition needs; progressing    Monitor:   TF tolerance, refeeding labs, total protein/energy intake, labs, weights, GI profile  Assessment:   -Pt's husband endorsed an unintentional weight loss of 40 lb in 2 months; however previous medical records indicate 20 lbs since 06/2014, and 40 lbs since 04/2014 (20% body weight loss in < 6 months, severe for time frame) -Diet recall indicates pt with minimal intake for past two months, largest meal in past 2 weeks with 1/2 grilled cheese sandwich and soup. Husband has been encouraging gatorade and fluids; however pt has been unable to tolerate any liquids or solids for past one week d/t nausea and  emesis -Pt s/p Jtube placement -Surgery recommended trickle feeds for 24 hours. Will place recommendations for goal rate, and bolus feed recommendations to implement upon d/c. Nothing by mouth at this time. -Family concerned with loose stools and skin care. Relayed to RN family concerns, and RD will follow up for additional home TF nutrition concerns.  -K low, Phos/Mg WNL. Monitor with initiation of EN and replete as needed -Admitted dehydrated; monitor fluid needs   11/13: -Pt tolerating Osmolite 1.2 at 15 ml/hr trickle feeds -Surgery requesting to continue with trickle feeds d/t concern for ileus -Pt will require continuous feeds vs bolus feeds upon d/c d/t Jtube placement. See Intervention section for d/c instructions -Continues to be at risk for refeeding, recommended refeeding labs be run when TF at 50% goal (approx 30 ml/hr) -RD provided pt and family with [employer] nutrition education booklet regarding home tube feeding instruction   Height: Ht Readings from Last 1 Encounters:  08/26/14 5\' 6"  (1.676 m)    Weight Status:   Wt Readings from Last 1 Encounters:  08/29/14 168 lb (76.204 kg)    Re-estimated needs:  Kcal: 1750-1950 Protein: 85-95 gram Fluid: >/= 2200 ml daily  Skin: WDL  Diet Order: Diet NPO time specified   Intake/Output Summary (Last 24 hours) at 08/29/14 0904 Last data filed at 08/29/14 0834  Gross per 24 hour  Intake      0 ml  Output    650 ml  Net   -650 ml    Last BM: 11/04  Labs:   Recent Labs Lab 08/26/14 1504 08/27/14 0419 08/29/14 0511  NA 138 144 140  K 3.4* 3.2* 3.6*  CL 97 107 102  CO2 21 21 26   BUN 15 14 7   CREATININE 0.79 0.70 0.73  CALCIUM 9.8 8.7 8.3*  MG 1.7  --   --   PHOS 3.3  --   --   GLUCOSE 101* 86 134*    CBG (last 3)   Recent Labs  08/29/14 0004 08/29/14 0407 08/29/14 0815  GLUCAP 113* 111* 104*    Scheduled Meds: . docusate sodium  100 mg Oral BID  . enoxaparin (LOVENOX) injection  40 mg  Subcutaneous Q24H  . free water  100 mL Per Tube 3 times per day  . levothyroxine  37.5 mcg Intravenous QAC breakfast  . ondansetron (ZOFRAN) IV  16 mg Intravenous 3 times per day  . pantoprazole (PROTONIX) IV  40 mg Intravenous Q24H  . potassium chloride  40 mEq Oral BID    Continuous Infusions: . feeding supplement (OSMOLITE 1.2 CAL) 1,000 mL (08/28/14 1237)    Atlee Abide MS RD LDN Clinical Dietitian [phone]

## 2014-08-29 NOTE — Progress Notes (Addendum)
[facility] PROGRESS NOTE Assessment/Plan: Nausea with vomiting: - Likely to gastric cancer,surgery place J-tube on 11.11.2015. Significant adhesion during surgery. - CT scan with contrast of the abdomen and pelvis on 08/06/2014 shows abnormal wall thickening in the distal exogenous intending into the stomach antrum.  - CT chest showed sub-carina lymph node.  Dehydration - due to #1, improving with IV fluids. - Change fluids to potassium supplements.  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 - CT scan of the abdomen and pelvis does not appear to have evidence of metastases, CT scan of the chest with contrast Bilateral effusions with bibasilar atelectatic change.  - awaiting Oncology recommendations. ? Porto-cath  Severe protein caloric malnutrition: - NJ tube get a nutrition consult.  Orthostasis Hypotension: - Resolved. - Continue aggressive fluid hydration.  Hypothyroidism - TSH 7.0.  - Cont IV synthroid.  Gastroesophageal reflux disease: - PPI.  Obstructive sleep apnea  Depression: Stable.     Code Status: Full DVT Prophylaxis: Lovenox Family Communication: Updated patient, no family present. Disposition Plan: Admit to [facility]   Consultants:  Onco  surgery  Procedures:  J tube  Antibiotics:  none  HPI/Subjective: Nausea improved.  Objective: Filed Vitals:   08/28/14 0439 08/28/14 1327 08/28/14 2033 08/29/14 0411  BP: 115/69 150/67 138/74 149/73  Pulse: 90 97 90 101  Temp: 97.7 F (36.5 C) 98.4 F (36.9 C) 98.7 F (37.1 C) 98.6 F (37 C)  TempSrc: Oral Oral Oral Oral  Resp:  16 16 18   Height:      Weight: 76.431 kg (168 lb 8 oz)   76.204 kg (168 lb)  SpO2: 96% 97% 98% 99%    Intake/Output Summary (Last 24 hours) at 08/29/14 0847 Last data filed at 08/29/14 0834  Gross per 24 hour  Intake      0 ml  Output    650 ml  Net   -650 ml   Filed Weights   08/27/14  0421 08/28/14 0439 08/29/14 0411  Weight: 78.245 kg (172 lb 8 oz) 76.431 kg (168 lb 8 oz) 76.204 kg (168 lb)    Exam:  General: Alert, awake, oriented x3, in no acute distress.  HEENT: No bruits, no goiter.  Heart: Regular rate and rhythm. Lungs: Good air movement, clear Abdomen: Soft, nontender, nondistended, positive bowel sounds.    Data Reviewed: Basic Metabolic Panel:  Recent Labs Lab 08/24/14 1315 08/26/14 1504 08/27/14 0419 08/29/14 0511  NA 144 138 144 140  K 3.5* 3.4* 3.2* 3.6*  CL 100 97 107 102  CO2 23 21 21 26   GLUCOSE 102* 101* 86 134*  BUN 18 15 14 7   CREATININE 0.90 0.79 0.70 0.73  CALCIUM 9.8 9.8 8.7 8.3*  MG  --  1.7  --   --   PHOS  --  3.3  --   --    Liver Function Tests:  Recent Labs Lab 08/26/14 1504  AST 16  ALT 10  ALKPHOS 85  BILITOT 0.7  PROT 7.4  ALBUMIN 3.3*   No results for input(s): LIPASE, AMYLASE in the last 168 hours. No results for input(s): AMMONIA in the last 168 hours. CBC:  Recent Labs Lab 08/24/14 1315 08/26/14 1504 08/27/14 0419  WBC 12.3* 12.5* 8.6  NEUTROABS 9.9* 10.1*  --   HGB 14.6 13.2 12.0  HCT 43.2 39.4 35.6*  MCV 97.7 96.1 96.7  PLT 374 384 356   Cardiac Enzymes: No results for input(s): CKTOTAL,  CKMB, CKMBINDEX, TROPONINI in the last 168 hours. BNP (last 3 results) No results for input(s): PROBNP in the last 8760 hours. CBG:  Recent Labs Lab 08/28/14 1709 08/28/14 2026 08/29/14 0004 08/29/14 0407 08/29/14 0815  GLUCAP 122* 102* 113* 111* 104*    Recent Results (from the past 240 hour(s))  Urine culture     Status: None   Collection Time: 08/26/14  6:54 PM  Result Value Ref Range Status   Specimen Description URINE, RANDOM  Final   Special Requests NONE  Final   Culture  Setup Time   Final    08/26/2014 22:23 Performed at [place]   Final    >=100,000 COLONIES/ML Performed at [facility]    Culture   Final    Multiple bacterial morphotypes  present, none predominant. Suggest appropriate recollection if clinically indicated. Performed at [facility]    Report Status 08/27/2014 FINAL  Final     Studies: Ct Chest W Contrast  08/27/2014   CLINICAL DATA:  Recent diagnosis of gastric carcinoma. Currently with fatigue  EXAM: CT CHEST WITH CONTRAST  TECHNIQUE: Multidetector CT imaging of the chest was performed during intravenous contrast administration.  CONTRAST:  15mL OMNIPAQUE IOHEXOL 300 MG/ML  SOLN  COMPARISON:  Chest radiograph August 26, 2014; CT abdomen and pelvis August 06, 2014  FINDINGS: There are moderate pleural effusions bilaterally with patchy atelectasis in both lung bases. There is a moderate pericardial effusion. There is focal consolidation in the medial left apex. There is no appreciable parenchymal lung mass.  There are small mediastinal lymph nodes at several sites. There is a precarinal lymph node measuring 1.6 x 1.2 cm. There is a sub- carinal lymph node measuring 1.6 by 1.1 cm. Several smaller lymph nodes are also present. There is atherosclerotic change in the aorta but no aneurysm or dissection. No pulmonary embolus appreciable.  There is generalized dilatation of the esophagus.  In the visualized upper abdomen, there is ascites noted, apparently new compared to prior study. There is mesenteric thickening in the left abdomen, new from prior study. Extensive wall thickening throughout the stomach persists. Multiple surgical clips in the upper abdomen remain. There is atherosclerotic change in the aorta.  There are no blastic or lytic bone lesions. There is a 7 x 7 mm nodular lesion in the right lobe of the thyroid. The left lobe of the thyroid is essentially completely absent.  IMPRESSION: Bilateral effusions with bibasilar atelectatic change. Focal area of infiltrate in the medial left apex with air bronchograms in this area.  Moderate pericardial effusion.  Mild lymph node prominence in the precarinal and  subcarinal regions.  Small nodular lesion in the right lobe of the thyroid. This finding may warrant nonemergent thyroid ultrasound to further assess.  Persistent gastric wall thickening. There is new inflammatory change in the mid abdomen, particularly surrounding the third portion of the duodenum. There is also mild ascites. Given this change compared to prior studies, it may be prudent to obtain complete abdomen and pelvis CT to further assess. No free air or portal venous air is seen. Multiple surgical clips are noted in the upper abdomen, stable.  These results will be called to the ordering clinician or representative by the Radiologist Assistant, and communication documented in the PACS or zVision Dashboard.   Electronically Signed   By: Lowella Grip M.D.   On: 08/27/2014 11:17    Scheduled Meds: . docusate sodium  100 mg  Oral BID  . enoxaparin (LOVENOX) injection  40 mg Subcutaneous Q24H  . free water  100 mL Per Tube 3 times per day  . levothyroxine  37.5 mcg Intravenous QAC breakfast  . ondansetron (ZOFRAN) IV  16 mg Intravenous 3 times per day  . pantoprazole (PROTONIX) IV  40 mg Intravenous Q24H  . potassium chloride  40 mEq Oral BID   Continuous Infusions: . feeding supplement (OSMOLITE 1.2 CAL) 1,000 mL (08/28/14 1237)     Venetia Constable Marguarite Arbour  [facility] Pager [phone]. If 8PM-8AM, please contact night-coverage at [email or web address], password [facility] 08/29/2014, 8:47 AM  LOS: 3 days

## 2014-08-29 NOTE — Plan of Care (Signed)
Problem: Phase I Progression Outcomes Goal: OOB as tolerated unless otherwise ordered Outcome: Completed/Met Date Met:  08/29/14

## 2014-08-29 NOTE — Plan of Care (Signed)
Problem: Phase I Progression Outcomes Goal: Pain controlled with appropriate interventions Outcome: Completed/Met Date Met:  08/29/14 Morphine 90m IV q4h prn effectively controlling pain

## 2014-08-29 NOTE — Progress Notes (Signed)
Sabrina Melton   DOB:1951/07/15   AV#:[id number]   NWG#:[id number]  Subjective: Saw her on floor, she was doing PT. Still has pain at procedure sites, no BM yet.    Objective:  Filed Vitals:   08/29/14 1405  BP: 136/73  Pulse: 102  Temp: 98.2 F (36.8 C)  Resp: 16    Body mass index is 27.13 kg/(m^2).  Intake/Output Summary (Last 24 hours) at 08/29/14 1646 Last data filed at 08/29/14 1405  Gross per 24 hour  Intake      0 ml  Output   1150 ml  Net  -1150 ml     Sclerae unicteric  Oropharynx clear  No peripheral adenopathy  Lungs clear -- no rales or rhonchi  Heart regular rate and rhythm  Abdomen benign, (+) J tube   MSK no focal spinal tenderness, no peripheral edema  Neuro nonfocal   CBG (last 3)   Recent Labs  08/29/14 0407 08/29/14 0815 08/29/14 1158  GLUCAP 111* 104* 125*     Labs:  Lab Results  Component Value Date   WBC 8.6 08/27/2014   HGB 12.0 08/27/2014   HCT 35.6* 08/27/2014   MCV 96.7 08/27/2014   PLT 356 08/27/2014   NEUTROABS 10.1* 08/26/2014     Basic Metabolic Panel:  Recent Labs Lab 08/24/14 1315 08/26/14 1504 08/27/14 0419 08/29/14 0511  NA 144 138 144 140  K 3.5* 3.4* 3.2* 3.6*  CL 100 97 107 102  CO2 23 21 21 26   GLUCOSE 102* 101* 86 134*  BUN 18 15 14 7   CREATININE 0.90 0.79 0.70 0.73  CALCIUM 9.8 9.8 8.7 8.3*  MG  --  1.7  --   --   PHOS  --  3.3  --   --    GFR Estimated Creatinine Clearance: 76.1 mL/min (by C-G formula based on Cr of 0.73). Liver Function Tests:  Recent Labs Lab 08/26/14 1504  AST 16  ALT 10  ALKPHOS 85  BILITOT 0.7  PROT 7.4  ALBUMIN 3.3*   No results for input(s): LIPASE, AMYLASE in the last 168 hours. No results for input(s): AMMONIA in the last 168 hours. Coagulation profile  Recent Labs Lab 08/26/14 1504  INR 1.26    CBC:  Recent Labs Lab 08/24/14 1315 08/26/14 1504 08/27/14 0419  WBC 12.3* 12.5* 8.6  NEUTROABS 9.9* 10.1*  --   HGB 14.6 13.2 12.0  HCT 43.2 39.4  35.6*  MCV 97.7 96.1 96.7  PLT 374 384 356   Cardiac Enzymes: No results for input(s): CKTOTAL, CKMB, CKMBINDEX, TROPONINI in the last 168 hours. BNP: Invalid input(s): POCBNP CBG:  Recent Labs Lab 08/28/14 2026 08/29/14 0004 08/29/14 0407 08/29/14 0815 08/29/14 1158  GLUCAP 102* 113* 111* 104* 125*   Path: Both diaphragm soft tissue biopsy and ascites cytology from November 112015 were positive for metastatic adenocarcinoma.   Assessment:  63 F, with newly diagnosed metastatic gastric cancer.   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  CT abdomen and pelvis do not appear to have evidence of metastasis.  GI surgeon consultation obtained on 11/10, Dr. 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  Plan:  -I discussed the biopsy results and cytology results with patient this morning. I explained her gastric cancer has spread to the peritoneum, unfortunately this is not surgically resectable and not curable. I encouraged her to do physical therapy and  get a feeding tube. Improve her nutrition status. -I recommend to get up port placed for chemotherapy. She agrees. -I have scheduled her to return to my clinic on November 23 to further discuss chemotherapy.     Truitt Merle, MD 08/29/2014  4:46 PM

## 2014-08-29 NOTE — Progress Notes (Signed)
2 Days Post-Op  Subjective: She is doing ok.  She complains of surgical pain and continued nausea, but these are controlled with medications.  She is tolerating trickle tube feeds.      Objective: Vital signs in last 24 hours: Temp:  [98.4 F (36.9 C)-98.7 F (37.1 C)] 98.6 F (37 C) (11/13 0411) Pulse Rate:  [90-101] 101 (11/13 0411) Resp:  [16-18] 18 (11/13 0411) BP: (138-150)/(67-74) 149/73 mmHg (11/13 0411) SpO2:  [97 %-99 %] 99 % (11/13 0411) Weight:  [168 lb (76.204 kg)] 168 lb (76.204 kg) (11/13 0411) Last BM Date: 08/20/14 AFEBRILE, HR up some, VSS   Intake/Output from previous day: 11/12 0701 - 11/13 0700 In: -  Out: 650 [Urine:650] Intake/Output this shift:    General appearance: alert, cooperative and no distress GI: sore, port sites and abdominal wound, J tube site look fine.  Lab Results:   Recent Labs  08/26/14 1504 08/27/14 0419  WBC 12.5* 8.6  HGB 13.2 12.0  HCT 39.4 35.6*  PLT 384 356    BMET  Recent Labs  08/27/14 0419 08/29/14 0511  NA 144 140  K 3.2* 3.6*  CL 107 102  CO2 21 26  GLUCOSE 86 134*  BUN 14 7  CREATININE 0.70 0.73  CALCIUM 8.7 8.3*   PT/INR  Recent Labs  08/26/14 1504  LABPROT 15.9*  INR 1.26     Recent Labs Lab 08/26/14 1504  AST 16  ALT 10  ALKPHOS 85  BILITOT 0.7  PROT 7.4  ALBUMIN 3.3*     Lipase  No results found for: LIPASE   Studies/Results: Ct Chest W Contrast  08/27/2014   CLINICAL DATA:  Recent diagnosis of gastric carcinoma. Currently with fatigue  EXAM: CT CHEST WITH CONTRAST  TECHNIQUE: Multidetector CT imaging of the chest was performed during intravenous contrast administration.  CONTRAST:  35mL OMNIPAQUE IOHEXOL 300 MG/ML  SOLN  COMPARISON:  Chest radiograph August 26, 2014; CT abdomen and pelvis August 06, 2014  FINDINGS: There are moderate pleural effusions bilaterally with patchy atelectasis in both lung bases. There is a moderate pericardial effusion. There is focal consolidation  in the medial left apex. There is no appreciable parenchymal lung mass.  There are small mediastinal lymph nodes at several sites. There is a precarinal lymph node measuring 1.6 x 1.2 cm. There is a sub- carinal lymph node measuring 1.6 by 1.1 cm. Several smaller lymph nodes are also present. There is atherosclerotic change in the aorta but no aneurysm or dissection. No pulmonary embolus appreciable.  There is generalized dilatation of the esophagus.  In the visualized upper abdomen, there is ascites noted, apparently new compared to prior study. There is mesenteric thickening in the left abdomen, new from prior study. Extensive wall thickening throughout the stomach persists. Multiple surgical clips in the upper abdomen remain. There is atherosclerotic change in the aorta.  There are no blastic or lytic bone lesions. There is a 7 x 7 mm nodular lesion in the right lobe of the thyroid. The left lobe of the thyroid is essentially completely absent.  IMPRESSION: Bilateral effusions with bibasilar atelectatic change. Focal area of infiltrate in the medial left apex with air bronchograms in this area.  Moderate pericardial effusion.  Mild lymph node prominence in the precarinal and subcarinal regions.  Small nodular lesion in the right lobe of the thyroid. This finding may warrant nonemergent thyroid ultrasound to further assess.  Persistent gastric wall thickening. There is new inflammatory change in the mid  abdomen, particularly surrounding the third portion of the duodenum. There is also mild ascites. Given this change compared to prior studies, it may be prudent to obtain complete abdomen and pelvis CT to further assess. No free air or portal venous air is seen. Multiple surgical clips are noted in the upper abdomen, stable.  These results will be called to the ordering clinician or representative by the Radiologist Assistant, and communication documented in the PACS or zVision Dashboard.   Electronically Signed    By: Lowella Grip M.D.   On: 08/27/2014 11:17    Medications: . docusate sodium  100 mg Oral BID  . enoxaparin (LOVENOX) injection  40 mg Subcutaneous Q24H  . free water  100 mL Per Tube 3 times per day  . levothyroxine  37.5 mcg Intravenous QAC breakfast  . ondansetron (ZOFRAN) IV  16 mg Intravenous 3 times per day  . pantoprazole (PROTONIX) IV  40 mg Intravenous Q24H  . potassium chloride  40 mEq Oral BID   . feeding supplement (OSMOLITE 1.2 CAL) 1,000 mL (08/28/14 1237)   Prior to Admission medications   Medication Sig Start Date End Date Taking? Authorizing Provider  Dexlansoprazole (DEXILANT) 30 MG capsule Take 30 mg by mouth daily.   Yes Historical Provider, MD  diltiazem (CARDIZEM CD) 120 MG 24 hr capsule Take 120 mg by mouth daily.   Yes Historical Provider, MD  levothyroxine (SYNTHROID, LEVOTHROID) 75 MCG tablet Take 75 mcg by mouth daily before breakfast.   Yes Historical Provider, MD  ondansetron (ZOFRAN-ODT) 4 MG disintegrating tablet Take 4 mg by mouth every 8 (eight) hours as needed for nausea or vomiting.   Yes Historical Provider, MD  promethazine (PHENERGAN) 25 MG suppository Place 25 mg rectally every 6 (six) hours as needed for nausea or vomiting.   Yes Historical Provider, MD  diltiazem (CARDIZEM CD) 120 MG 24 hr capsule TAKE ONE CAPSULE BY MOUTH EVERY DAY Patient not taking: Reported on 08/26/2014 08/18/14   Fay Records, MD  fluticasone (FLONASE) 50 MCG/ACT nasal spray USE 1 SPRAY INTO THE NOSE DAILY. Patient not taking: Reported on 08/26/2014 10/18/13   Lucretia Kern, DO  levothyroxine (SYNTHROID, LEVOTHROID) 75 MCG tablet TAKE 1 TABLET EVERY DAY Patient not taking: Reported on 08/26/2014 06/30/14   Lucretia Kern, DO    Assessment/Plan Gastric cancer with dehydration and malnutrition S/p Exploratory Laparoscopy, Peritoneal biopsy and j tube placement, 88/41/6606, Leighton Ruff, MD Hx of Hodgkins Hypothyroid Hx of OSA Hypertension   Plan: Cont trickle tube  feeds, no more than 10-15 ml per hour. She had significant lysis of adhesions, so may develop an ileus. Be sure she does not get any medications thru the tube.  It is only for tube feeds.  Flush tube after each use and daily.  Can advance tube feed rate slowly once pt has return of bowel function.    She can mobilize.   We discussed her pathology.  Biopsy confirms peritoneal metastases.  She will discuss her chemotherapy choices with Dr Burr Medico.     LOS: 3 days    Nayan Proch C. 30/16/0109

## 2014-08-29 NOTE — Plan of Care (Signed)
Problem: Phase I Progression Outcomes Goal: Initial discharge plan identified Outcome: Completed/Met Date Met:  08/29/14

## 2014-08-29 NOTE — Telephone Encounter (Signed)
LM to confirm appt d/t for Nov. °

## 2014-08-29 NOTE — Evaluation (Signed)
Physical Therapy Evaluation Patient Details Name: Sabrina Melton MRN: [id number] DOB: Jun 24, 1951 Today's Date: 08/29/2014   History of Present Illness  63 yo female s/p exp lap, j tube placement 08/27/14.  Clinical Impression  On eval, pt was Min assist for bed mobility and Min guard assist for ambulation-able to walk ~350 feet with RW. Tolerated activity well. Do not anticipate any follow up PT needs at discharge.     Follow Up Recommendations No PT follow up;Supervision for mobility/OOB    Equipment Recommendations  None recommended by PT    Recommendations for Other Services       Precautions / Restrictions Precautions Precautions: Fall Restrictions Weight Bearing Restrictions: No      Mobility  Bed Mobility Overal bed mobility: Needs Assistance Bed Mobility: Supine to Sit     Supine to sit: Min assist;HOB elevated     General bed mobility comments: Assist for trunk. Increased time  Transfers Overall transfer level: Needs assistance Equipment used: Rolling walker (2 wheeled) Transfers: Sit to/from Stand Sit to Stand: Min guard            Ambulation/Gait Ambulation/Gait assistance: Min guard Ambulation Distance (Feet): 350 Feet Assistive device: Rolling walker (2 wheeled) Gait Pattern/deviations: Step-through pattern;Drifts right/left;Trunk flexed        Stairs            Wheelchair Mobility    Modified Rankin (Stroke Patients Only)       Balance                                             Pertinent Vitals/Pain Pain Assessment: 0-10 Pain Score: 5  Pain Location: abdomen Pain Intervention(s): Monitored during session    [place] expects to be discharged to:: Private residence Living Arrangements: Spouse/significant other   Type of Home: House Home Access: Stairs to enter Entrance Stairs-Rails: Right Entrance Stairs-Number of Steps: 2 Home Layout: One level Home Equipment: None       Prior Function Level of Independence: Independent               Hand Dominance        Extremity/Trunk Assessment   Upper Extremity Assessment: Generalized weakness           Lower Extremity Assessment: Generalized weakness      Cervical / Trunk Assessment: Normal  Communication   Communication: No difficulties  Cognition Arousal/Alertness: Awake/alert Behavior During Therapy: WFL for tasks assessed/performed Overall Cognitive Status: Within Functional Limits for tasks assessed                      General Comments      Exercises        Assessment/Plan    PT Assessment Patient needs continued PT services  PT Diagnosis Difficulty walking;Generalized weakness;Acute pain   PT Problem List Decreased strength;Decreased activity tolerance;Decreased balance;Decreased mobility;Pain  PT Treatment Interventions DME instruction;Gait training;Functional mobility training;Therapeutic activities;Therapeutic exercise;Patient/family education;Balance training   PT Goals (Current goals can be found in the Care Plan section) Acute Rehab PT Goals Patient Stated Goal: home soon PT Goal Formulation: With patient Time For Goal Achievement: 09/12/14 Potential to Achieve Goals: Good    Frequency Min 3X/week   Barriers to discharge        Co-evaluation  End of Session   Activity Tolerance: Patient tolerated treatment well Patient left: in chair;with call bell/phone within reach;with family/visitor present           Time: 0915-0939 PT Time Calculation (min) (ACUTE ONLY): 24 min   Charges:   PT Evaluation $Initial PT Evaluation Tier I: 1 Procedure PT Treatments $Gait Training: 8-22 mins $Therapeutic Activity: 8-22 mins   PT G Codes:          Weston Anna, MPT Pager: [phone]

## 2014-08-30 LAB — BASIC METABOLIC PANEL
Anion gap: 13 (ref 5–15)
BUN: 6 mg/dL (ref 6–23)
CO2: 27 mEq/L (ref 19–32)
Calcium: 8.4 mg/dL (ref 8.4–10.5)
Chloride: 98 mEq/L (ref 96–112)
Creatinine, Ser: 0.61 mg/dL (ref 0.50–1.10)
GLUCOSE: 122 mg/dL — AB (ref 70–99)
Potassium: 3 mEq/L — ABNORMAL LOW (ref 3.7–5.3)
SODIUM: 138 meq/L (ref 137–147)

## 2014-08-30 LAB — GLUCOSE, CAPILLARY
GLUCOSE-CAPILLARY: 117 mg/dL — AB (ref 70–99)
Glucose-Capillary: 104 mg/dL — ABNORMAL HIGH (ref 70–99)
Glucose-Capillary: 106 mg/dL — ABNORMAL HIGH (ref 70–99)
Glucose-Capillary: 108 mg/dL — ABNORMAL HIGH (ref 70–99)
Glucose-Capillary: 118 mg/dL — ABNORMAL HIGH (ref 70–99)
Glucose-Capillary: 122 mg/dL — ABNORMAL HIGH (ref 70–99)

## 2014-08-30 MED ORDER — POTASSIUM CHLORIDE 10 MEQ/100ML IV SOLN
10.0000 meq | INTRAVENOUS | Status: AC
  Administered 2014-08-30 (×4): 10 meq via INTRAVENOUS
  Filled 2014-08-30 (×4): qty 100

## 2014-08-30 MED ORDER — SODIUM CHLORIDE 0.9 % IV SOLN
INTRAVENOUS | Status: DC
  Administered 2014-08-30: 500 mL via INTRAVENOUS

## 2014-08-30 MED ORDER — SODIUM CHLORIDE 0.9 % IV SOLN: INTRAVENOUS | Status: DC

## 2014-08-30 MED ORDER — PROMETHAZINE HCL 25 MG/ML IJ SOLN
25.0000 mg | Freq: Four times a day (QID) | INTRAMUSCULAR | Status: DC | PRN
  Administered 2014-08-30 – 2014-09-09 (×28): 25 mg via INTRAVENOUS
  Filled 2014-08-30 (×29): qty 1

## 2014-08-30 NOTE — Progress Notes (Signed)
Patient has spit up a little mucus like secretions that "taste like food" per pt. Mucus has a slight tan color and has odor of tube feed. Patient with lots of belching and burping. Still with no flatus.

## 2014-08-30 NOTE — Progress Notes (Signed)
Pt has ambulated around unit x1 today, c/o slight lightheadedness. Has sat up in recliner since 1000. Using IS hourly, only reaching around 400cc for now. PRN phenergan and morphine given at 1315, effective. Continues to belch with taste/smell of tube feed present. Hortencia Conradi RN

## 2014-08-30 NOTE — Plan of Care (Signed)
Problem: Phase III Progression Outcomes Goal: Voiding independently Outcome: Completed/Met Date Met:  08/30/14 Has voided twice since foley removed

## 2014-08-30 NOTE — Plan of Care (Signed)
Problem: Phase II Progression Outcomes Goal: Progress activity as tolerated unless otherwise ordered Outcome: Progressing     

## 2014-08-30 NOTE — Plan of Care (Signed)
Problem: Phase I Progression Outcomes Goal: Voiding-avoid urinary catheter unless indicated Outcome: Completed/Met Date Met:  08/30/14     

## 2014-08-30 NOTE — Progress Notes (Signed)
[facility] PROGRESS NOTE Assessment/Plan: Nausea with vomiting: - Likely due to gastric cancer,surgery place J-tube on 11.11.2015. Significant adhesion during surgery. - Now improved. - now on trickle feeds, has not have BM. - on zofran still with nausea increase phenergan. Check and EKG.  Dehydration - due to #1, improving with IV fluids. - Change fluids to potassium supplements.  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 - - CT chest showed sub-carina lymph node. - Porto-cath placed on 11.13.2015.  Severe protein caloric malnutrition: - NJ tube get a nutrition consult.  Orthostasis Hypotension: - Resolved. - Continue aggressive fluid hydration.  Hypothyroidism - TSH 7.0.  - Cont IV synthroid.  Gastroesophageal reflux disease: - PPI.  Obstructive sleep apnea  Depression: Stable.     Code Status: Full DVT Prophylaxis: Lovenox Family Communication: Updated patient, no family present. Disposition Plan: Admit to [facility]   Consultants:  Onco  surgery  Procedures:  J tube  Antibiotics:  none  HPI/Subjective: Now vomiting.  Objective: Filed Vitals:   08/29/14 1405 08/29/14 2108 08/29/14 2141 08/30/14 0402  BP: 136/73 142/75 149/91 135/76  Pulse: 102 105 92 101  Temp: 98.2 F (36.8 C) 98.3 F (36.8 C) 98.8 F (37.1 C) 98.1 F (36.7 C)  TempSrc: Oral Oral Oral Oral  Resp: 16 16 16 16   Height:      Weight:    78.2 kg (172 lb 6.4 oz)  SpO2: 92% 91% 92% 96%    Intake/Output Summary (Last 24 hours) at 08/30/14 0903 Last data filed at 08/30/14 3235  Gross per 24 hour  Intake      0 ml  Output    750 ml  Net   -750 ml   Filed Weights   08/28/14 0439 08/29/14 0411 08/30/14 0402  Weight: 76.431 kg (168 lb 8 oz) 76.204 kg (168 lb) 78.2 kg (172 lb 6.4 oz)    Exam:  General: Alert, awake, oriented x3, in no acute distress.  HEENT: No bruits, no goiter.  Heart: Regular  rate and rhythm. Lungs: Good air movement, clear Abdomen: Soft, J-tube in place.   Data Reviewed: Basic Metabolic Panel:  Recent Labs Lab 08/24/14 1315 08/26/14 1504 08/27/14 0419 08/29/14 0511 08/30/14 0400  NA 144 138 144 140 138  K 3.5* 3.4* 3.2* 3.6* 3.0*  CL 100 97 107 102 98  CO2 23 21 21 26 27   GLUCOSE 102* 101* 86 134* 122*  BUN 18 15 14 7 6   CREATININE 0.90 0.79 0.70 0.73 0.61  CALCIUM 9.8 9.8 8.7 8.3* 8.4  MG  --  1.7  --   --   --   PHOS  --  3.3  --   --   --    Liver Function Tests:  Recent Labs Lab 08/26/14 1504  AST 16  ALT 10  ALKPHOS 85  BILITOT 0.7  PROT 7.4  ALBUMIN 3.3*   No results for input(s): LIPASE, AMYLASE in the last 168 hours. No results for input(s): AMMONIA in the last 168 hours. CBC:  Recent Labs Lab 08/24/14 1315 08/26/14 1504 08/27/14 0419  WBC 12.3* 12.5* 8.6  NEUTROABS 9.9* 10.1*  --   HGB 14.6 13.2 12.0  HCT 43.2 39.4 35.6*  MCV 97.7 96.1 96.7  PLT 374 384 356   Cardiac Enzymes: No results for input(s): CKTOTAL, CKMB, CKMBINDEX, TROPONINI in the last 168 hours. BNP (last 3 results) No results for input(s): PROBNP in the last 8760 hours.  CBG:  Recent Labs Lab 08/29/14 1718 08/29/14 2106 08/30/14 0001 08/30/14 0400 08/30/14 0741  GLUCAP 111* 117* 121* 106* 122*    Recent Results (from the past 240 hour(s))  Urine culture     Status: None   Collection Time: 08/26/14  6:54 PM  Result Value Ref Range Status   Specimen Description URINE, RANDOM  Final   Special Requests NONE  Final   Culture  Setup Time   Final    08/26/2014 22:23 Performed at [place]   Final    >=100,000 COLONIES/ML Performed at [facility]    Culture   Final    Multiple bacterial morphotypes present, none predominant. Suggest appropriate recollection if clinically indicated. Performed at [facility]    Report Status 08/27/2014 FINAL  Final     Studies: No results  found.  Scheduled Meds: . [START ON 09/01/2014]  ceFAZolin (ANCEF) IV  2 g Intravenous On Call  . docusate sodium  100 mg Oral BID  . enoxaparin (LOVENOX) injection  40 mg Subcutaneous Q24H  . free water  100 mL Per Tube 3 times per day  . levothyroxine  37.5 mcg Intravenous QAC breakfast  . ondansetron (ZOFRAN) IV  16 mg Intravenous 3 times per day  . pantoprazole (PROTONIX) IV  40 mg Intravenous Q24H  . potassium chloride  10 mEq Intravenous Q1 Hr x 4   Continuous Infusions: . feeding supplement (OSMOLITE 1.2 CAL) 1,000 mL (08/30/14 0510)     Charlynne Cousins  [facility] Pager [phone]. If 8PM-8AM, please contact night-coverage at [email or web address], password [facility] 08/30/2014, 9:03 AM  LOS: 4 days

## 2014-08-30 NOTE — Plan of Care (Signed)
Problem: Consults Goal: Nutrition Consult-if indicated Outcome: Completed/Met Date Met:  08/30/14

## 2014-08-30 NOTE — Progress Notes (Signed)
K+ 3.0 paged NP oncall [place] and orders received for k runs x4

## 2014-08-30 NOTE — Progress Notes (Signed)
General Surgery Note  LOS: 4 days  POD -  3 Days Post-Op  Assessment/Plan: 1.  Exploratory Laparoscopy,  abdominal biopsy and J tube placement - 08/27/2014 - A. Thomas  Getting trickle feeds - will keep at current level until evidence of bowel function.  Needs to ambulate more.  2.  Metastatic gastric cancer - peritoneal mets  Seen by Dr. Ky Barban - plans for chemotx 3.  History of Hodgkins disease - remote 4.  OSA 5.  HTN 6.  DVT prophylaxis - Lovenox 7.  Hypokalemia - K+ - 3.0 - 08/30/2014 - replacement ordered   Principal Problem:   Dehydration Active Problems:   Obstructive sleep apnea   Essential hypertension   Hypothyroidism   Hyperlipemia   Nausea with vomiting   Gastric cancer   Tachycardia   Orthostasis   Depression   GERD (gastroesophageal reflux disease)   Nausea & vomiting   Protein-calorie malnutrition, severe   Signet-ring cell carcinoma of stomach   Subjective:  No flatus or BM.  She has spit up a little that taste like tube feedings.  Objective:   Filed Vitals:   08/30/14 0402  BP: 135/76  Pulse: 101  Temp: 98.1 F (36.7 C)  Resp: 16     Intake/Output from previous day:  11/13 0701 - 11/14 0700 In: 0  Out: 500 [Urine:500]  Intake/Output this shift:      Physical Exam:   General: WN WF who is alert and oriented.    HEENT: Normal. Pupils equal. .   Lungs: Clear.   Abdomen: Soft.  Rare BS   Wound: LUQ jejunostomy tube   Lab Results:   No results for input(s): WBC, HGB, HCT, PLT in the last 72 hours.  BMET   Recent Labs  08/29/14 0511 08/30/14 0400  NA 140 138  K 3.6* 3.0*  CL 102 98  CO2 26 27  GLUCOSE 134* 122*  BUN 7 6  CREATININE 0.73 0.61  CALCIUM 8.3* 8.4    PT/INR  No results for input(s): LABPROT, INR in the last 72 hours.  ABG  No results for input(s): PHART, HCO3 in the last 72 hours.  Invalid input(s): PCO2, PO2   Studies/Results:  No results found.   Anti-infectives:   Anti-infectives    Start      Dose/Rate Route Frequency Ordered Stop   09/01/14 1200  ceFAZolin (ANCEF) IVPB 2 g/50 mL premix     2 g100 mL/hr over 30 Minutes Intravenous On call 08/29/14 1546 09/02/14 1200   08/27/14 1045  cefOXitin (MEFOXIN) 2 g in dextrose 5 % 50 mL IVPB     2 g100 mL/hr over 30 Minutes Intravenous On call to O.R. 08/27/14 1044 08/27/14 1236      Alphonsa Overall, MD, FACS Pager: [place] [facility] Office: [phone] 08/30/2014

## 2014-08-31 DIAGNOSIS — E43 Unspecified severe protein-calorie malnutrition: Secondary | ICD-10-CM

## 2014-08-31 DIAGNOSIS — E876 Hypokalemia: Secondary | ICD-10-CM

## 2014-08-31 DIAGNOSIS — F329 Major depressive disorder, single episode, unspecified: Secondary | ICD-10-CM

## 2014-08-31 LAB — BASIC METABOLIC PANEL
ANION GAP: 12 (ref 5–15)
BUN: 7 mg/dL (ref 6–23)
CALCIUM: 8.3 mg/dL — AB (ref 8.4–10.5)
CO2: 27 mEq/L (ref 19–32)
Chloride: 100 mEq/L (ref 96–112)
Creatinine, Ser: 0.67 mg/dL (ref 0.50–1.10)
GFR calc non Af Amer: >90 mL/min (ref >90)
Glucose, Bld: 122 mg/dL — ABNORMAL HIGH (ref 70–99)
Potassium: 3.2 mEq/L — ABNORMAL LOW (ref 3.7–5.3)
Sodium: 139 mEq/L (ref 137–147)

## 2014-08-31 LAB — GLUCOSE, CAPILLARY
GLUCOSE-CAPILLARY: 119 mg/dL — AB (ref 70–99)
Glucose-Capillary: 102 mg/dL — ABNORMAL HIGH (ref 70–99)
Glucose-Capillary: 116 mg/dL — ABNORMAL HIGH (ref 70–99)
Glucose-Capillary: 120 mg/dL — ABNORMAL HIGH (ref 70–99)
Glucose-Capillary: 96 mg/dL (ref 70–99)

## 2014-08-31 LAB — PHOSPHORUS: Phosphorus: 3 mg/dL (ref 2.3–4.6)

## 2014-08-31 LAB — MAGNESIUM: MAGNESIUM: 1.5 mg/dL (ref 1.5–2.5)

## 2014-08-31 MED ORDER — SODIUM CHLORIDE 0.9 % IV SOLN
INTRAVENOUS | Status: DC
  Administered 2014-08-31 – 2014-09-04 (×3): via INTRAVENOUS
  Filled 2014-08-31 (×7): qty 1000

## 2014-08-31 NOTE — Progress Notes (Signed)
General Surgery Note  LOS: 5 days  POD -  4 Days Post-Op  Assessment/Plan: 1.  Exploratory Laparoscopy,  abdominal biopsy and J tube placement - 08/27/2014 - A. Thomas  Getting trickle feeds - will keep at current level until evidence of bowel function.  Still no flatus or BM.  But not distended or nauseated.  2.  Metastatic gastric cancer - peritoneal mets  Seen by Dr. Ky Barban - plans for chemotx 3.  History of Hodgkins disease - remote 4.  OSA 5.  HTN 6.  DVT prophylaxis - Lovenox 7.  Hypokalemia - K+ - 3.2 - 08/31/2014 - a little better   Principal Problem:   Dehydration Active Problems:   Obstructive sleep apnea   Essential hypertension   Hypothyroidism   Hyperlipemia   Nausea with vomiting   Gastric cancer   Tachycardia   Orthostasis   Depression   GERD (gastroesophageal reflux disease)   Nausea & vomiting   Protein-calorie malnutrition, severe   Signet-ring cell carcinoma of stomach   Subjective:  No flatus or BM.  Having some indigestion. Objective:   Filed Vitals:   08/31/14 0504  BP: 113/64  Pulse: 100  Temp: 98.1 F (36.7 C)  Resp: 16     Intake/Output from previous day:  11/14 0701 - 11/15 0700 In: 1662.8 [I.V.:656; NG/GT:446.8; IV Piggyback:350] Out: 1350 [Urine:1300; Emesis/NG output:50]  Intake/Output this shift:      Physical Exam:   General: WN WF who is alert and oriented.    HEENT: Normal. Pupils equal. .   Lungs: Clear.   Abdomen: Soft.  Rare BS   Wound: LUQ jejunostomy tube   Lab Results:   No results for input(s): WBC, HGB, HCT, PLT in the last 72 hours.  BMET    Recent Labs  08/30/14 0400 08/31/14 0423  NA 138 139  K 3.0* 3.2*  CL 98 100  CO2 27 27  GLUCOSE 122* 122*  BUN 6 7  CREATININE 0.61 0.67  CALCIUM 8.4 8.3*    PT/INR  No results for input(s): LABPROT, INR in the last 72 hours.  ABG  No results for input(s): PHART, HCO3 in the last 72 hours.  Invalid input(s): PCO2, PO2   Studies/Results:  No results  found.   Anti-infectives:   Anti-infectives    Start     Dose/Rate Route Frequency Ordered Stop   09/01/14 1200  ceFAZolin (ANCEF) IVPB 2 g/50 mL premix     2 g100 mL/hr over 30 Minutes Intravenous On call 08/29/14 1546 09/02/14 1200   08/27/14 1045  cefOXitin (MEFOXIN) 2 g in dextrose 5 % 50 mL IVPB     2 g100 mL/hr over 30 Minutes Intravenous On call to O.R. 08/27/14 1044 08/27/14 1236      Alphonsa Overall, MD, FACS Pager: [facility] Office: [phone] 08/31/2014

## 2014-08-31 NOTE — Progress Notes (Signed)
[facility] PROGRESS NOTE Assessment/Plan: Nausea with vomiting: - Likely due to gastric cancer, General surgery service placed J-tube on 11.11.2015.  - Now improved. - now on trickle feeds, has not have flatus or BM. - continue PRN zofran and phenergan  Dehydration and hypokalemia  -continue IVF's with potassium supplementation  Gastric cancer -Ascities fluid Cytology showed 11.13.2015- ADENOCARCINOMA WITH SIGNET RING FEATURES. -CT chest showed sub-carina lymph node. - Port A cath to be place on 11.16.2015.  Severe protein caloric malnutrition: -J tube in place; not fully receiving appropriate rate for nutrition yet -patient intestinal activity is not fully restore  -once she start having flatus/BM will increase rate -maintain elevated head  Orthostasis Hypotension: - Resolved. - Continue IVF's maintenance  Hypothyroidism - TSH 7.0.  - Cont IV synthroid for now  Gastroesophageal reflux disease: -continue   Obstructive sleep apnea Continue CPAP at bedtime  Depression: Stable. -no SI or hallucinations -patient was not taking any meds at home -continue supportive care     Code Status: Full DVT Prophylaxis: Lovenox Family Communication: no family at bedside Disposition Plan: continue inpatient; port-cath in am   Consultants:  Onco  Surgery  IR (for Port-cath placement)  Procedures:  J tube  Antibiotics:  none  HPI/Subjective: No fever; denies CP and SOB. Patient is feeling nauseous; but has not had any vomiting episode.  Objective: Filed Vitals:   08/30/14 2008 08/30/14 2045 08/31/14 0504 08/31/14 1333  BP: 137/65  113/64 121/61  Pulse: 105 88 100 104  Temp: 99 F (37.2 C)  98.1 F (36.7 C) 98.6 F (37 C)  TempSrc:   Oral Oral  Resp: 16  16 16   Height:      Weight:   80.468 kg (177 lb 6.4 oz)   SpO2: 91%  96% 93%    Intake/Output Summary (Last 24 hours) at 08/31/14 1342 Last data filed at 08/31/14 1300  Gross per 24 hour   Intake 1662.75 ml  Output   1300 ml  Net 362.75 ml   Filed Weights   08/29/14 0411 08/30/14 0402 08/31/14 0504  Weight: 76.204 kg (168 lb) 78.2 kg (172 lb 6.4 oz) 80.468 kg (177 lb 6.4 oz)    Exam:  General: Alert, awake, oriented x3, in no acute distress. No Fever; denies CP or SOB HEENT: No bruits, no goiter; no JVD Heart: Regular rate and rhythm; No rubs or gallops Lungs: Good air movement, no wheezing, no rales Abdomen: Soft, J-tube in place; no BS appreciated   Data Reviewed: Basic Metabolic Panel:  Recent Labs Lab 08/26/14 1504 08/27/14 0419 08/29/14 0511 08/30/14 0400 08/31/14 0423 08/31/14 0951  NA 138 144 140 138 139  --   K 3.4* 3.2* 3.6* 3.0* 3.2*  --   CL 97 107 102 98 100  --   CO2 21 21 26 27 27   --   GLUCOSE 101* 86 134* 122* 122*  --   BUN 15 14 7 6 7   --   CREATININE 0.79 0.70 0.73 0.61 0.67  --   CALCIUM 9.8 8.7 8.3* 8.4 8.3*  --   MG 1.7  --   --   --   --  1.5  PHOS 3.3  --   --   --  3.0  --    Liver Function Tests:  Recent Labs Lab 08/26/14 1504  AST 16  ALT 10  ALKPHOS 85  BILITOT 0.7  PROT 7.4  ALBUMIN 3.3*   CBC:  Recent Labs Lab 08/26/14  1504 08/27/14 0419  WBC 12.5* 8.6  NEUTROABS 10.1*  --   HGB 13.2 12.0  HCT 39.4 35.6*  MCV 96.1 96.7  PLT 384 356   CBG:  Recent Labs Lab 08/30/14 1700 08/30/14 2006 08/30/14 2357 08/31/14 0751 08/31/14 1300  GLUCAP 104* 108* 118* 119* 102*    Recent Results (from the past 240 hour(s))  Urine culture     Status: None   Collection Time: 08/26/14  6:54 PM  Result Value Ref Range Status   Specimen Description URINE, RANDOM  Final   Special Requests NONE  Final   Culture  Setup Time   Final    08/26/2014 22:23 Performed at [place]   Final    >=100,000 COLONIES/ML Performed at [facility]    Culture   Final    Multiple bacterial morphotypes present, none predominant. Suggest appropriate recollection if clinically  indicated. Performed at [facility]    Report Status 08/27/2014 FINAL  Final     Studies: No results found.  Scheduled Meds: . [START ON 09/01/2014]  ceFAZolin (ANCEF) IV  2 g Intravenous On Call  . docusate sodium  100 mg Oral BID  . enoxaparin (LOVENOX) injection  40 mg Subcutaneous Q24H  . free water  100 mL Per Tube 3 times per day  . levothyroxine  37.5 mcg Intravenous QAC breakfast  . ondansetron (ZOFRAN) IV  16 mg Intravenous 3 times per day  . pantoprazole (PROTONIX) IV  40 mg Intravenous Q24H   Continuous Infusions: . sodium chloride 500 mL (08/30/14 2159)  . feeding supplement (OSMOLITE 1.2 CAL) 1,000 mL (08/31/14 0514)   Time: 25 minutes  Barton Dubois  [facility] Pager [phone]. If 8PM-8AM, please contact night-coverage at [email or web address], password [facility] 08/31/2014, 1:42 PM  LOS: 5 days

## 2014-08-31 NOTE — Progress Notes (Signed)
Spoke with pt regarding cpap.  Pt stated she recently stopped wearing cpap at home and does not want to wear it tonight.  Pt was explained the importance of wearing it, but she still declined.  Pt stated she has enough to deal with it, and that cpap pressure bothers her stomach.  Pt was advised that RT is available all night and encouraged her to call should she change her mind.  RN aware.

## 2014-08-31 NOTE — Plan of Care (Signed)
Problem: Phase II Progression Outcomes Goal: Progress activity as tolerated unless otherwise ordered Outcome: Not Met (add Reason) 2100 11/14- Patient does not want to ambulate anymore tonight. Reports being tired. "I walked twice today and sat up in the chair" states patient. "I will walk more tomorrow, I am tired". Told patient to set goals in am of walking 4 times at least.

## 2014-09-01 ENCOUNTER — Inpatient Hospital Stay (HOSPITAL_COMMUNITY): Payer: BC Managed Care – PPO

## 2014-09-01 DIAGNOSIS — K219 Gastro-esophageal reflux disease without esophagitis: Secondary | ICD-10-CM

## 2014-09-01 DIAGNOSIS — G4733 Obstructive sleep apnea (adult) (pediatric): Secondary | ICD-10-CM

## 2014-09-01 LAB — GLUCOSE, CAPILLARY
GLUCOSE-CAPILLARY: 101 mg/dL — AB (ref 70–99)
Glucose-Capillary: 118 mg/dL — ABNORMAL HIGH (ref 70–99)
Glucose-Capillary: 113 mg/dL — ABNORMAL HIGH (ref 70–99)
Glucose-Capillary: 96 mg/dL (ref 70–99)
Glucose-Capillary: 88 mg/dL (ref 70–99)

## 2014-09-01 LAB — CBC
HCT: 33.5 % — ABNORMAL LOW (ref 36.0–46.0)
HEMOGLOBIN: 11.1 g/dL — AB (ref 12.0–15.0)
MCH: 32.8 pg (ref 26.0–34.0)
MCHC: 33.1 g/dL (ref 30.0–36.0)
MCV: 99.1 fL (ref 78.0–100.0)
Platelets: 343 10*3/uL (ref 150–400)
RBC: 3.38 MIL/uL — AB (ref 3.87–5.11)
RDW: 13.5 % (ref 11.5–15.5)
WBC: 13.2 10*3/uL — ABNORMAL HIGH (ref 4.0–10.5)

## 2014-09-01 LAB — BASIC METABOLIC PANEL
Anion gap: 10 (ref 5–15)
BUN: 7 mg/dL (ref 6–23)
CALCIUM: 8.2 mg/dL — AB (ref 8.4–10.5)
CO2: 27 mEq/L (ref 19–32)
Chloride: 105 mEq/L (ref 96–112)
Creatinine, Ser: 0.67 mg/dL (ref 0.50–1.10)
GFR calc Af Amer: >90 mL/min (ref >90)
GFR calc non Af Amer: >90 mL/min (ref >90)
GLUCOSE: 115 mg/dL — AB (ref 70–99)
Potassium: 3.9 mEq/L (ref 3.7–5.3)
SODIUM: 142 meq/L (ref 137–147)

## 2014-09-01 MED ORDER — IOHEXOL 300 MG/ML  SOLN
50.0000 mL | Freq: Once | INTRAMUSCULAR | Status: AC | PRN
  Administered 2014-09-01: 50 mL via ORAL

## 2014-09-01 MED ORDER — POTASSIUM CHLORIDE IN NACL 20-0.9 MEQ/L-% IV SOLN
INTRAVENOUS | Status: AC
  Administered 2014-09-01 – 2014-09-06 (×7): via INTRAVENOUS
  Filled 2014-09-01 (×10): qty 1000

## 2014-09-01 NOTE — Progress Notes (Addendum)
Have had to increase oxygen to 4l during the night due to oxygen sats dropping into mid 80's while patient sleeping. Patient reports she knows she "quit breathing" at those moments. Patient mouth breathing while asleep. Since increasing it to 4l around 0236 have not heard pump monitors "go off". Will continue to monitor. Patient refuses CPAP but using pulse ox and CO2 Monitoring to monitor.

## 2014-09-01 NOTE — Progress Notes (Signed)
Patient scheduled today for port a catheter in IR. WBC today elevated at 13.2, previously 8.6 on 11/11, spoke with [facility] and Dr. Burr Medico IR will hold off on port placement for now to further investigate leukocytosis.   Tsosie Billing PA-C Interventional Radiology  09/01/14  1:00 PM

## 2014-09-01 NOTE — Progress Notes (Signed)
5 Days Post-Op  Subjective: She has had several episodes of nausea and vomiting, she says it looks like TF and smells terrible.  No flatus or BM.  She has been on trickle feeds since 11/12.  Objective: Vital signs in last 24 hours: Temp:  [98.6 F (37 C)-99.4 F (37.4 C)] 98.9 F (37.2 C) (11/16 0521) Pulse Rate:  [94-109] 100 (11/16 0735) Resp:  [14-22] 14 (11/16 0735) BP: (116-128)/(61-69) 128/67 mmHg (11/16 0521) SpO2:  [88 %-96 %] 96 % (11/16 0735) Weight:  [81.285 kg (179 lb 3.2 oz)] 81.285 kg (179 lb 3.2 oz) (11/16 0521) Last BM Date: 08/20/14 Emesis  Report x 3 Afebrile, HR up WBC 13.2 CXR this AM shows:  Pulmonary vascular congestion/ pulmonary edema with bilateral pleural effusions. Passive atelectasis lung bases suspected. Difficult to exclude basilar infiltrate given the pleural effusions/atelectasis.Medial left upper lobe consolidation with left apical pleural thickening similar to prior exam.  Intake/Output from previous day: 11/15 0701 - 11/16 0700 In: 3825 [I.V.:730; NG/GT:719; IV Piggyback:100] Out: 1025 [Urine:1025] Intake/Output this shift:    General appearance: alert, cooperative and no distress GI: soft sore, sites are a little red.  No distension, no BS, no Flatus, no BM  Lab Results:   Recent Labs  09/01/14 0430  WBC 13.2*  HGB 11.1*  HCT 33.5*  PLT 343    BMET  Recent Labs  08/31/14 0423 09/01/14 0430  NA 139 142  K 3.2* 3.9  CL 100 105  CO2 27 27  GLUCOSE 122* 115*  BUN 7 7  CREATININE 0.67 0.67  CALCIUM 8.3* 8.2*   PT/INR No results for input(s): LABPROT, INR in the last 72 hours.   Recent Labs Lab 08/26/14 1504  AST 16  ALT 10  ALKPHOS 85  BILITOT 0.7  PROT 7.4  ALBUMIN 3.3*     Lipase  No results found for: LIPASE   Studies/Results: Dg Chest Port 1 View  09/01/2014   CLINICAL DATA:  63 year old female with history of sleep apnea, gastric cancer and pleural effusions. Prior smoker. Subsequent encounter.  EXAM:  PORTABLE CHEST - 1 VIEW  COMPARISON:  08/27/2014 CT.  01/09/2013 chest x-ray.  FINDINGS: Medial left upper lobe consolidation with left apical pleural thickening similar to prior exam. Etiology indeterminate.  Pulmonary vascular congestion/ pulmonary edema with bilateral pleural effusions. Passive atelectasis lung bases suspected. Difficult to exclude basilar infiltrate given the pleural effusions/atelectasis.  No gross pneumothorax.  Calcified aorta.  Heart size within normal limits.  Surgical clips left upper quadrant.  IMPRESSION: Pulmonary vascular congestion/ pulmonary edema with bilateral pleural effusions. Passive atelectasis lung bases suspected. Difficult to exclude basilar infiltrate given the pleural effusions/atelectasis.  Medial left upper lobe consolidation with left apical pleural thickening similar to prior exam. Etiology indeterminate.   Electronically Signed   By: Chauncey Cruel M.D.   On: 09/01/2014 07:36    Medications: .  ceFAZolin (ANCEF) IV  2 g Intravenous On Call  . docusate sodium  100 mg Oral BID  . enoxaparin (LOVENOX) injection  40 mg Subcutaneous Q24H  . free water  100 mL Per Tube 3 times per day  . levothyroxine  37.5 mcg Intravenous QAC breakfast  . ondansetron (ZOFRAN) IV  16 mg Intravenous 3 times per day  . pantoprazole (PROTONIX) IV  40 mg Intravenous Q24H    Assessment/Plan Exploratory Laparoscopy, abdominal biopsy and J tube placement - 08/27/2014 - A. Thomas 2. Metastatic gastric cancer - peritoneal mets Seen by Dr. Ky Barban -  plans for chemotx 3. History of Hodgkins disease - remote 4. OSA 5. HTN 6. DVT prophylaxis - Lovenox 7. Hypokalemia - K+ - 3.2 - 08/31/2014 - a little better  8.  Nausea and vomiting/ileus Plan:  Hold TF, irrigate with water.  I will talk with Dr. Hassell Done about gastrografin Jtube injection to see where if anyplace the TF is going.    LOS: 6 days    Sabrina Melton 09/01/2014

## 2014-09-01 NOTE — Plan of Care (Signed)
Problem: Phase II Progression Outcomes Goal: Progress activity as tolerated unless otherwise ordered Outcome: Not Met (add Reason) Patient report that she walked "several times today" and sat up in rocker and rocked "a lot today" but does not feel "up to walking any more" tonight.

## 2014-09-01 NOTE — Progress Notes (Signed)
Physical Therapy Treatment Patient Details Name: Sabrina Melton MRN: [id number] DOB: [birth year] Today's Date: 09/01/2014    History of Present Illness 63 yo female s/p exp lap, j tube placement 08/27/14.    PT Comments    Pt is independent-min guard with all mobility;  Pt was able to complete gait training in the hallway of 250 feet and then fatigued.  Pt did not require use of an AD during gait this tx.    Follow Up Recommendations  No PT follow up;Supervision for mobility/OOB     Equipment Recommendations  None recommended by PT    Recommendations for Other Services       Precautions / Restrictions Precautions Precautions: Fall Restrictions Weight Bearing Restrictions: No    Mobility  Bed Mobility Overal bed mobility: Independent Bed Mobility: Supine to Sit     Supine to sit: Independent        Transfers Overall transfer level: Independent Equipment used: None Transfers: Sit to/from Stand Sit to Stand: Independent            Ambulation/Gait Ambulation/Gait assistance: Min guard Ambulation Distance (Feet): 250 Feet Assistive device: None Gait Pattern/deviations: Step-through pattern     General Gait Details: pt completed gait on RA; O2 sats ranged from 94%-97%; pt fatigued   Stairs            Wheelchair Mobility    Modified Rankin (Stroke Patients Only)       Balance                                    Cognition Arousal/Alertness: Awake/alert Behavior During Therapy: WFL for tasks assessed/performed Overall Cognitive Status: Within Functional Limits for tasks assessed                      Exercises      General Comments        Pertinent Vitals/Pain Pain Assessment: 0-10 Pain Score: 5  Pain Location: abdomen Pain Intervention(s): Monitored during session;Limited activity within patient's tolerance;Repositioned    Home Living                      Prior Function            PT  Goals (current goals can now be found in the care plan section) Progress towards PT goals: Progressing toward goals    Frequency  Min 3X/week    PT Plan Current plan remains appropriate    Co-evaluation             End of Session Equipment Utilized During Treatment: Gait belt Activity Tolerance: Patient tolerated treatment well Patient left: in chair;with call bell/phone within reach     Time: 0915-0938 PT Time Calculation (min) (ACUTE ONLY): 23 min  Charges:  $Gait Training: 8-22 mins $Therapeutic Activity: 8-22 mins                    G Codes:      Miller,Derrick, SPTA 09/01/2014, 1:46 PM   Reviewed above  Rica Koyanagi  PTA [facility]  Acute  Rehab Pager      [phone]

## 2014-09-01 NOTE — Progress Notes (Signed)
[facility] PROGRESS NOTE Assessment/Plan: Nausea with vomiting: - Likely due to gastric cancer, General surgery service placed J-tube on 11.11.2015.  -still with some intermittent nausea and some spitting; slowly improved. - now on trickle feeds, has not have flatus or BM. 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 -CT chest showed sub-carina lymph node. - Port A cath to be place today 11.16.2015.  Severe protein caloric malnutrition: -J tube in place; not fully receiving appropriate rate for nutrition yet -patient intestinal activity is not fully restore  -once she start having flatus/BM will increase rate -maintain elevated head  Orthostasis Hypotension: - Resolved. - Continue IVF's maintenance  Hypothyroidism - TSH 7.0.  - Cont IV synthroid for now  Gastroesophageal reflux disease: -continue   Obstructive sleep apnea Continue CPAP at bedtime; patient has been refusing it  Depression: Stable. -no SI or hallucinations -patient was not taking any meds at home -continue supportive care     Code Status: Full DVT Prophylaxis: Lovenox Family Communication: no family at bedside Disposition Plan: continue inpatient; port-cath in am   Consultants:  Onco  Surgery  IR (for Port-cath placement)  Procedures:  J tube  Antibiotics:  none  HPI/Subjective: No fever. Still with some nausea; refusing CPAP at night  Objective: Filed Vitals:   09/01/14 0521 09/01/14 0525 09/01/14 0735 09/01/14 2005  BP: 128/67   124/66  Pulse: 102 104 100 107  Temp: 98.9 F (37.2 C)   98.8 F (37.1 C)  TempSrc: Oral   Oral  Resp: 20 19 14 14   Height:      Weight: 81.285 kg (179 lb 3.2 oz)     SpO2: 95% 96% 96% 95%    Intake/Output Summary (Last 24 hours) at  09/01/14 2146 Last data filed at 09/01/14 1734  Gross per 24 hour  Intake 762.17 ml  Output    750 ml  Net  12.17 ml   Filed Weights   08/30/14 0402 08/31/14 0504 09/01/14 0521  Weight: 78.2 kg (172 lb 6.4 oz) 80.468 kg (177 lb 6.4 oz) 81.285 kg (179 lb 3.2 oz)    Exam:  General: Alert, awake, oriented x3, in no acute distress. No Fever; denies CP or SOB HEENT: No bruits, no goiter; no JVD Heart: Regular rate and rhythm; No rubs or gallops Lungs: Good air movement, no wheezing, no rales Abdomen: Soft, J-tube in place; no BS appreciated   Data Reviewed: Basic Metabolic Panel:  Recent Labs Lab 08/26/14 1504 08/27/14 0419 08/29/14 0511 08/30/14 0400 08/31/14 0423 08/31/14 0951 09/01/14 0430  NA 138 144 140 138 139  --  142  K 3.4* 3.2* 3.6* 3.0* 3.2*  --  3.9  CL 97 107 102 98 100  --  105  CO2 21 21 26 27 27   --  27  GLUCOSE 101* 86 134* 122* 122*  --  115*  BUN 15 14 7 6 7   --  7  CREATININE 0.79 0.70 0.73 0.61 0.67  --  0.67  CALCIUM 9.8 8.7 8.3* 8.4 8.3*  --  8.2*  MG 1.7  --   --   --   --  1.5  --   PHOS 3.3  --   --   --  3.0  --   --    Liver Function Tests:  Recent Labs Lab 08/26/14 1504  AST 16  ALT 10  ALKPHOS 85  BILITOT 0.7  PROT 7.4  ALBUMIN 3.3*   CBC:  Recent Labs Lab 08/26/14 1504 08/27/14 0419 09/01/14 0430  WBC 12.5* 8.6 13.2*  NEUTROABS 10.1*  --   --   HGB 13.2 12.0 11.1*  HCT 39.4 35.6* 33.5*  MCV 96.1 96.7 99.1  PLT 384 356 343   CBG:  Recent Labs Lab 09/01/14 0504 09/01/14 0743 09/01/14 1207 09/01/14 1606 09/01/14 2003  GLUCAP 118* 113* 101* 96 88    Recent Results (from the past 240 hour(s))  Urine culture     Status: None   Collection Time: 08/26/14  6:54 PM  Result Value Ref Range Status   Specimen Description URINE, RANDOM  Final   Special Requests NONE  Final   Culture  Setup Time   Final    08/26/2014 22:23 Performed at [place]   Final    >=100,000  COLONIES/ML Performed at [facility]    Culture   Final    Multiple bacterial morphotypes present, none predominant. Suggest appropriate recollection if clinically indicated. Performed at [facility]    Report Status 08/27/2014 FINAL  Final     Studies: Dg Abd 1 View  09/01/2014   CLINICAL DATA:  63 year old female with percutaneous jejunostomy tube requiring placement verification. Initial encounter.  EXAM: ABDOMEN - 1 VIEW  COMPARISON:  Abdominal radiographs 08/26/2014 and earlier.  CONTRAST:  50 mL Omnipaque 300  FINDINGS: KUB of the abdomen at 1546 hr following contrast injection through the indwelling J tube. Several small bowel loops are opacified in the left mid abdomen. No evidence of abnormal contrast accumulation or leak. Following this image, the tube was flushed with 50 mL of water.  Scattered surgical clips in the abdomen are stable. Non obstructed bowel gas pattern. Abdominal and pelvic visceral contours are stable. No acute osseous abnormality identified.  IMPRESSION: Percutaneous J-tube with no adverse features.   Electronically Signed   By: Lars Pinks M.D.   On: 09/01/2014 16:13   Dg Chest Port 1 View  09/01/2014   CLINICAL DATA:  63 year old female with history of sleep apnea, gastric cancer and pleural effusions. Prior smoker. Subsequent encounter.  EXAM: PORTABLE CHEST - 1 VIEW  COMPARISON:  08/27/2014 CT.  01/09/2013 chest x-ray.  FINDINGS: Medial left upper lobe consolidation with left apical pleural thickening similar to prior exam. Etiology indeterminate.  Pulmonary vascular congestion/ pulmonary edema with bilateral pleural effusions. Passive atelectasis lung bases suspected. Difficult to exclude basilar infiltrate given the pleural effusions/atelectasis.  No gross pneumothorax.  Calcified aorta.  Heart size within normal limits.  Surgical clips left upper quadrant.  IMPRESSION: Pulmonary vascular congestion/ pulmonary edema with bilateral pleural effusions.  Passive atelectasis lung bases suspected. Difficult to exclude basilar infiltrate given the pleural effusions/atelectasis.  Medial left upper lobe consolidation with left apical pleural thickening similar to prior exam. Etiology indeterminate.   Electronically Signed   By: Chauncey Cruel M.D.   On: 09/01/2014 07:36    Scheduled Meds: .  ceFAZolin (ANCEF) IV  2 g Intravenous On Call  . docusate sodium  100 mg Oral BID  . enoxaparin (LOVENOX) injection  40 mg Subcutaneous Q24H  . free water  100 mL Per Tube 3 times per day  . levothyroxine  37.5 mcg Intravenous QAC breakfast  . ondansetron (ZOFRAN) IV  16 mg Intravenous 3 times per day  . pantoprazole (PROTONIX) IV  40 mg  Intravenous Q24H   Continuous Infusions: . 0.9 % NaCl with KCl 20 mEq / L 75 mL/hr at 09/01/14 1430  . feeding supplement (OSMOLITE 1.2 CAL) 1,000 mL (09/01/14 0522)  . sodium chloride 0.9 % 1,000 mL with potassium chloride 40 mEq infusion 50 mL/hr at 09/01/14 1425   Time: 25 minutes  Barton Dubois  [facility] Pager [phone]. If 8PM-8AM, please contact night-coverage at [email or web address], password [facility] 09/01/2014, 9:46 PM  LOS: 6 days

## 2014-09-01 NOTE — Progress Notes (Signed)
Patient continues to refuse nocturnal CPAP. She states she has stomach cancer and the CPAP irritates her condition. She is aware that RT is available at any time if she should change her mind and decide to try wearing it again.

## 2014-09-01 NOTE — Plan of Care (Signed)
Problem: Phase II Progression Outcomes Goal: Other Phase II Outcomes/Goals Outcome: Progressing Patient refused CPAP tonight. Have connected patient to pulse ox and CO2 monitors tonight. Patient placed on 2l [place] prior to "going to sleep" will monitor.

## 2014-09-01 NOTE — Progress Notes (Addendum)
NUTRITION FOLLOW UP  Intervention:   -TF on hold, for possible gastrografin study. -When TF restarted begin Osmolite 1.2  At 20 ml/hr and increase by 10 ml every 12 hours to goal rate of 65 ml/hr per J-tube.   -D/t jejunal tube placement, pt would not benefit from bolus feeding schedule; will require cyclic continuous tube feeding -When stable for d/c; recommend:  -Home regimen:Initiate Osmolite 1.2 at 65 ml/hr at 7pm, advance by 10 ml every 4 hours until goal rate of 120 ml/hr achieved. Hold tube feeding, and restart next scheduled feed at 120 ml/hr. Run continuous for 13 hours to meet estimated nutrition needs.  -Osmolite 1.2 at goal rate of 120 ml/hr for 13 hours (7pm-8am) via Jtube to provide 1872 kcal (100% est kcal need), 87 gram protein (100% est protein needs), and 1279 ml free H2O. Will require additional 920 ml free water via flushes (approximately 230 ml (8 oz) four times daily)  Nutrition Dx:   Inadequate oral intake related to inability to eat as evidenced by NPO status; ongoing  Goal:   TF to meet >/= 90% of their estimated nutrition needs; progressing    Monitor:   TF tolerance, refeeding labs, total protein/energy intake, labs, weights, GI profile  Assessment:   -Pt's husband endorsed an unintentional weight loss of 40 lb in 2 months; however previous medical records indicate 20 lbs since 06/2014, and 40 lbs since 04/2014 (20% body weight loss in < 6 months, severe for time frame) -Diet recall indicates pt with minimal intake for past two months, largest meal in past 2 weeks with 1/2 grilled cheese sandwich and soup. Husband has been encouraging gatorade and fluids; however pt has been unable to tolerate any liquids or solids for past one week d/t nausea and emesis -Pt s/p Jtube placement -Surgery recommended trickle feeds for 24 hours. Will place recommendations for goal rate, and bolus feed recommendations to implement upon d/c. Nothing by mouth at this time. -Family  concerned with loose stools and skin care. Relayed to RN family concerns, and RD will follow up for additional home TF nutrition concerns.  -K low, Phos/Mg WNL. Monitor with initiation of EN and replete as needed -Admitted dehydrated; monitor fluid needs   11/13: -Pt tolerating Osmolite 1.2 at 15 ml/hr trickle feeds -Surgery requesting to continue with trickle feeds d/t concern for ileus -Pt will require continuous feeds vs bolus feeds upon d/c d/t Jtube placement. See Intervention section for d/c instructions -Continues to be at risk for refeeding, recommended refeeding labs be run when TF at 50% goal (approx 30 ml/hr) -RD provided pt and family with [employer] nutrition education booklet regarding home tube feeding instruction  11/15:   -Patient with several episodes of nausea and vomiting which she states looks like TF.  No flatus or BM.  TF held. Concern for ileus.  Height: Ht Readings from Last 1 Encounters:  08/26/14 5\' 6"  (1.676 m)    Weight Status:   Wt Readings from Last 1 Encounters:  09/01/14 179 lb 3.2 oz (81.285 kg)    Re-estimated needs:  Kcal: 1750-1950 Protein: 85-95 gram Fluid: >/= 2200 ml daily  Skin: WDL  Diet Order: Diet NPO time specified Except for: Sips with Meds   Intake/Output Summary (Last 24 hours) at 09/01/14 1511 Last data filed at 09/01/14 1300  Gross per 24 hour  Intake   1754 ml  Output    950 ml  Net    804 ml    Last BM: 11/04  Labs:   Recent Labs Lab 08/26/14 1504  08/30/14 0400 08/31/14 0423 08/31/14 0951 09/01/14 0430  NA 138  < > 138 139  --  142  K 3.4*  < > 3.0* 3.2*  --  3.9  CL 97  < > 98 100  --  105  CO2 21  < > 27 27  --  27  BUN 15  < > 6 7  --  7  CREATININE 0.79  < > 0.61 0.67  --  0.67  CALCIUM 9.8  < > 8.4 8.3*  --  8.2*  MG 1.7  --   --   --  1.5  --   PHOS 3.3  --   --  3.0  --   --   GLUCOSE 101*  < > 122* 122*  --  115*  < > = values in this interval not displayed.  CBG (last 3)   Recent Labs   09/01/14 0504 09/01/14 0743 09/01/14 1207  GLUCAP 118* 113* 101*    Scheduled Meds: .  ceFAZolin (ANCEF) IV  2 g Intravenous On Call  . docusate sodium  100 mg Oral BID  . enoxaparin (LOVENOX) injection  40 mg Subcutaneous Q24H  . free water  100 mL Per Tube 3 times per day  . levothyroxine  37.5 mcg Intravenous QAC breakfast  . ondansetron (ZOFRAN) IV  16 mg Intravenous 3 times per day  . pantoprazole (PROTONIX) IV  40 mg Intravenous Q24H    Continuous Infusions: . 0.9 % NaCl with KCl 20 mEq / L 75 mL/hr at 09/01/14 1430  . feeding supplement (OSMOLITE 1.2 CAL) 1,000 mL (09/01/14 0522)  . sodium chloride 0.9 % 1,000 mL with potassium chloride 40 mEq infusion 50 mL/hr at 09/01/14 [place], RD, LDN Clinical Inpatient Dietitian Pager:  [phone] Weekend and after hours pager:  [phone]

## 2014-09-01 NOTE — Plan of Care (Signed)
Problem: Phase II Progression Outcomes Goal: Other Phase II Outcomes/Goals Outcome: Progressing Still no bm nor flatus. Hearing more bowel sounds that last pm.

## 2014-09-01 NOTE — Plan of Care (Signed)
Problem: Phase II Progression Outcomes Goal: Progress activity as tolerated unless otherwise ordered Outcome: Completed/Met Date Met:  09/01/14 Goal: Vital signs remain stable Outcome: Completed/Met Date Met:  09/01/14

## 2014-09-02 ENCOUNTER — Telehealth: Payer: Self-pay | Admitting: *Deleted

## 2014-09-02 DIAGNOSIS — E785 Hyperlipidemia, unspecified: Secondary | ICD-10-CM

## 2014-09-02 LAB — URINALYSIS, ROUTINE W REFLEX MICROSCOPIC
Glucose, UA: NEGATIVE mg/dL
Hgb urine dipstick: NEGATIVE
Ketones, ur: 40 mg/dL — AB
Leukocytes, UA: NEGATIVE
Nitrite: NEGATIVE
Protein, ur: NEGATIVE mg/dL
Specific Gravity, Urine: 1.017 (ref 1.005–1.030)
Urobilinogen, UA: 1 mg/dL (ref 0.0–1.0)
pH: 6 (ref 5.0–8.0)

## 2014-09-02 LAB — CREATININE, SERUM
Creatinine, Ser: 0.69 mg/dL (ref 0.50–1.10)
GFR calc Af Amer: >90 mL/min (ref >90)

## 2014-09-02 LAB — CBC
HEMATOCRIT: 35 % — AB (ref 36.0–46.0)
Hemoglobin: 11.3 g/dL — ABNORMAL LOW (ref 12.0–15.0)
MCH: 32.8 pg (ref 26.0–34.0)
MCHC: 32.3 g/dL (ref 30.0–36.0)
MCV: 101.4 fL — AB (ref 78.0–100.0)
PLATELETS: 351 10*3/uL (ref 150–400)
RBC: 3.45 MIL/uL — ABNORMAL LOW (ref 3.87–5.11)
RDW: 13.8 % (ref 11.5–15.5)
WBC: 12.7 10*3/uL — ABNORMAL HIGH (ref 4.0–10.5)

## 2014-09-02 LAB — GLUCOSE, CAPILLARY
Glucose-Capillary: 108 mg/dL — ABNORMAL HIGH (ref 70–99)
Glucose-Capillary: 86 mg/dL (ref 70–99)
Glucose-Capillary: 89 mg/dL (ref 70–99)
Glucose-Capillary: 90 mg/dL (ref 70–99)
Glucose-Capillary: 99 mg/dL (ref 70–99)

## 2014-09-02 MED ORDER — PROCHLORPERAZINE EDISYLATE 5 MG/ML IJ SOLN
10.0000 mg | Freq: Four times a day (QID) | INTRAMUSCULAR | Status: DC | PRN
  Administered 2014-09-06: 10 mg via INTRAVENOUS
  Filled 2014-09-02: qty 2

## 2014-09-02 NOTE — Progress Notes (Signed)
[facility] PROGRESS NOTE  Interim summary 63 y.o. female With history of recently diagnosed gastric cancer, hypothyroidism, gastroesophageal reflux disease, hypertension, depression, prior history of Hodgkin's disease who presented at oncologist's office for evaluation. Patient was noted to be orthostatic and noted to be tachycardic and dehydrated and a such patient was sent to the hospital as a direct admission for further evaluation and management. Patient states for the past week has been feeling very dehydrated, decreased appetite, nauseous, and positive emesis. Patient states she can't keep anything down and vomits every time she charts to eat or drink anything. Patient denies hematemesis.  Patient with some complaints of upper abdominal pain as well. Patient endorses some generalized weakness, and a nonproductive cough. Patient states hasn't had a bowel movement in about a week. Patient denies any fevers, no chills, no chest pain, shortness of breath or any other complaints. Course complicated with ileus after J tube placed. No advancing feeding's   Assessment/Plan: Nausea with vomiting: - Likely due to gastric cancer, General surgery service placed J-tube on 11.11.2015.  -J tube evaluation done on 11/16; working as intended -patient still with some intermittent nausea and some spitting; overall slowly improving. -BM overnight -dietician with orders to advance feeding as tolerated  -continue PRN zofran and phenergan; also with compazine prescription for refractory symptoms -concerns for medications route; according to nursing staff J tube not be use for meds (not even liquid forms); patient otherwise NPO. Will needs clarification from surgery regarding this.  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 -CT chest showed  sub-carina lymph node. - Port A cath cancel due to mild unspecified leukocytosis -placement time to be decided in outpatient settings  Severe protein caloric malnutrition: -J tube in place; not fully receiving appropriate rate for nutrition yet -patient intestinal activity is not fully restore  -BM overnight; plan is to advance rate to desire goal before discharge -maintain elevated head  Orthostasis Hypotension: - Resolved. - Continue IVF's maintenance for now  Hypothyroidism - TSH 7.0.  - Cont IV synthroid for now -initially plan was for oral synthroid pert ube; but family and nursing staff expressing that surgery do not want tube to be use for meds; not even liquid forms -will follow on that  Gastroesophageal reflux disease: -continue PPI; currently IV  Obstructive sleep apnea Continue CPAP at bedtime; patient has been refusing it  Depression: Overall Stable. -no SI or hallucinations -patient was not taking any meds at home -continue supportive care    Code Status: Full DVT Prophylaxis: Lovenox Family Communication: no family at bedside Disposition Plan: continue inpatient; port-cath in am   Consultants:  Onco  Surgery  IR (for Port-cath placement)  Procedures:  J tube  Antibiotics:  none  HPI/Subjective: No fever. Still with some nausea, no vomiting, but with episodes of spiting up (formula material)   Objective: Filed Vitals:   09/01/14 0735 09/01/14 2005 09/02/14 0449 09/02/14 1340  BP:  124/66 117/64 132/75  Pulse: 100 107 106 106  Temp:  98.8 F (37.1 C) 98.4 F (36.9 C) 98.9 F (37.2 C)  TempSrc:  Oral Oral Oral  Resp: 14 14 16 16   Height:      Weight:   84.233 kg (185 lb 11.2 oz)   SpO2: 96% 95% 94% 96%    Intake/Output Summary (Last 24 hours) at 09/02/14 1646 Last data filed at 09/02/14 1312  Gross  per 24 hour  Intake    869 ml  Output    250 ml  Net    619 ml   Filed Weights   08/31/14 0504 09/01/14 0521 09/02/14 0449    Weight: 80.468 kg (177 lb 6.4 oz) 81.285 kg (179 lb 3.2 oz) 84.233 kg (185 lb 11.2 oz)    Exam:  General: Alert, awake, oriented x3, in no acute distress. No Fever; denies CP or SOB HEENT: No bruits, no goiter; no JVD Heart: Regular rate and rhythm; No rubs or gallops Lungs: Good air movement, no wheezing, no rales Abdomen: Soft, J-tube in place; no BS appreciated   Data Reviewed: Basic Metabolic Panel:  Recent Labs Lab 08/27/14 0419 08/29/14 0511 08/30/14 0400 08/31/14 0423 08/31/14 0951 09/01/14 0430 09/02/14 0418  NA 144 140 138 139  --  142  --   K 3.2* 3.6* 3.0* 3.2*  --  3.9  --   CL 107 102 98 100  --  105  --   CO2 21 26 27 27   --  27  --   GLUCOSE 86 134* 122* 122*  --  115*  --   BUN 14 7 6 7   --  7  --   CREATININE 0.70 0.73 0.61 0.67  --  0.67 0.69  CALCIUM 8.7 8.3* 8.4 8.3*  --  8.2*  --   MG  --   --   --   --  1.5  --   --   PHOS  --   --   --  3.0  --   --   --    Liver Function Tests: No results for input(s): AST, ALT, ALKPHOS, BILITOT, PROT, ALBUMIN in the last 168 hours. CBC:  Recent Labs Lab 08/27/14 0419 09/01/14 0430 09/02/14 0418  WBC 8.6 13.2* 12.7*  HGB 12.0 11.1* 11.3*  HCT 35.6* 33.5* 35.0*  MCV 96.7 99.1 101.4*  PLT 356 343 351   CBG:  Recent Labs Lab 09/01/14 2003 09/02/14 0033 09/02/14 0833 09/02/14 1149 09/02/14 1612  GLUCAP 88 89 90 108* 86    Recent Results (from the past 240 hour(s))  Urine culture     Status: None   Collection Time: 08/26/14  6:54 PM  Result Value Ref Range Status   Specimen Description URINE, RANDOM  Final   Special Requests NONE  Final   Culture  Setup Time   Final    08/26/2014 22:23 Performed at [place]   Final    >=100,000 COLONIES/ML Performed at [facility]    Culture   Final    Multiple bacterial morphotypes present, none predominant. Suggest appropriate recollection if clinically indicated. Performed at [facility]    Report  Status 08/27/2014 FINAL  Final     Studies: Dg Abd 1 View  09/01/2014   CLINICAL DATA:  63 year old female with percutaneous jejunostomy tube requiring placement verification. Initial encounter.  EXAM: ABDOMEN - 1 VIEW  COMPARISON:  Abdominal radiographs 08/26/2014 and earlier.  CONTRAST:  50 mL Omnipaque 300  FINDINGS: KUB of the abdomen at 1546 hr following contrast injection through the indwelling J tube. Several small bowel loops are opacified in the left mid abdomen. No evidence of abnormal contrast accumulation or leak. Following this image, the tube was flushed with 50 mL of water.  Scattered surgical clips in the abdomen are stable. Non obstructed bowel gas pattern. Abdominal and pelvic visceral contours are stable. No acute  osseous abnormality identified.  IMPRESSION: Percutaneous J-tube with no adverse features.   Electronically Signed   By: Lars Pinks M.D.   On: 09/01/2014 16:13   Dg Chest Port 1 View  09/01/2014   CLINICAL DATA:  63 year old female with history of sleep apnea, gastric cancer and pleural effusions. Prior smoker. Subsequent encounter.  EXAM: PORTABLE CHEST - 1 VIEW  COMPARISON:  08/27/2014 CT.  01/09/2013 chest x-ray.  FINDINGS: Medial left upper lobe consolidation with left apical pleural thickening similar to prior exam. Etiology indeterminate.  Pulmonary vascular congestion/ pulmonary edema with bilateral pleural effusions. Passive atelectasis lung bases suspected. Difficult to exclude basilar infiltrate given the pleural effusions/atelectasis.  No gross pneumothorax.  Calcified aorta.  Heart size within normal limits.  Surgical clips left upper quadrant.  IMPRESSION: Pulmonary vascular congestion/ pulmonary edema with bilateral pleural effusions. Passive atelectasis lung bases suspected. Difficult to exclude basilar infiltrate given the pleural effusions/atelectasis.  Medial left upper lobe consolidation with left apical pleural thickening similar to prior exam. Etiology  indeterminate.   Electronically Signed   By: Chauncey Cruel M.D.   On: 09/01/2014 07:36    Scheduled Meds: . docusate sodium  100 mg Oral BID  . enoxaparin (LOVENOX) injection  40 mg Subcutaneous Q24H  . free water  100 mL Per Tube 3 times per day  . levothyroxine  37.5 mcg Intravenous QAC breakfast  . ondansetron (ZOFRAN) IV  16 mg Intravenous 3 times per day  . pantoprazole (PROTONIX) IV  40 mg Intravenous Q24H   Continuous Infusions: . 0.9 % NaCl with KCl 20 mEq / L 75 mL/hr at 09/02/14 0519  . feeding supplement (OSMOLITE 1.2 CAL) 1,000 mL (09/01/14 0522)  . sodium chloride 0.9 % 1,000 mL with potassium chloride 40 mEq infusion 50 mL/hr at 09/01/14 1425   Time: 25 minutes  Barton Dubois  [facility] Pager [phone]. If 8PM-8AM, please contact night-coverage at [email or web address], password [facility] 09/02/2014, 4:46 PM  LOS: 7 days

## 2014-09-02 NOTE — Telephone Encounter (Signed)
Received vm call from pt's husband, Collier Salina asking for update on his wife.  He states that he understands that she has an infection but is not on any ATB.  Returned call after speaking with Dr Burr Medico & husband reports that he is concerned & states information is late getting to him & everything is so confusing.  He would like to have a "plan of attack" & wishes that someone would update him regularly.  Informed that Dr Burr Medico will talk with the hospitalist get back with pt or wife later today.  He can be reached at [phone].  Note to Dr Burr Medico.

## 2014-09-02 NOTE — Progress Notes (Signed)
Patient refuses nocturnal CPAP. Order changed to prn per RT protocol.

## 2014-09-02 NOTE — Progress Notes (Signed)
6 Days Post-Op  Subjective: Still having nausea even without TF.  Film last evening shows the contrast going in without an issue we can see.  Objective: Vital signs in last 24 hours: Temp:  [98.4 F (36.9 C)-98.8 F (37.1 C)] 98.4 F (36.9 C) (11/17 0449) Pulse Rate:  [106-107] 106 (11/17 0449) Resp:  [14-16] 16 (11/17 0449) BP: (117-124)/(64-66) 117/64 mmHg (11/17 0449) SpO2:  [94 %-95 %] 94 % (11/17 0449) Weight:  [84.233 kg (185 lb 11.2 oz)] 84.233 kg (185 lb 11.2 oz) (11/17 0449) Last BM Date: 09/01/14 +BM yesterday Afebrile, a little tachycardic No labs Film yesterday shows:  KUB of the abdomen at 1546 hr following contrast injection through the indwelling J tube. Several small bowel loops are opacified in the left mid abdomen. No evidence of abnormal contrast accumulation or leak. Following this image, the tube was flushed with 50 mL of Water. Scattered surgical clips in the abdomen are stable. Non obstructed bowel gas pattern. Abdominal and pelvic visceral contours are stable. No acute osseous abnormality identified.  Intake/Output from previous day: 11/16 0701 - 11/17 0700 In: 869 [I.V.:869] Out: 425 [Urine:425] Intake/Output this shift:    General appearance: alert, cooperative and no distress GI: soft, sore, sites oK  Lab Results:   Recent Labs  09/01/14 0430  WBC 13.2*  HGB 11.1*  HCT 33.5*  PLT 343    BMET  Recent Labs  08/31/14 0423 09/01/14 0430 09/02/14 0418  NA 139 142  --   K 3.2* 3.9  --   CL 100 105  --   CO2 27 27  --   GLUCOSE 122* 115*  --   BUN 7 7  --   CREATININE 0.67 0.67 0.69  CALCIUM 8.3* 8.2*  --    PT/INR No results for input(s): LABPROT, INR in the last 72 hours.   Recent Labs Lab 08/26/14 1504  AST 16  ALT 10  ALKPHOS 85  BILITOT 0.7  PROT 7.4  ALBUMIN 3.3*     Lipase  No results found for: LIPASE   Studies/Results: Dg Abd 1 View  09/01/2014   CLINICAL DATA:  63 year old female with percutaneous  jejunostomy tube requiring placement verification. Initial encounter.  EXAM: ABDOMEN - 1 VIEW  COMPARISON:  Abdominal radiographs 08/26/2014 and earlier.  CONTRAST:  50 mL Omnipaque 300  FINDINGS: KUB of the abdomen at 1546 hr following contrast injection through the indwelling J tube. Several small bowel loops are opacified in the left mid abdomen. No evidence of abnormal contrast accumulation or leak. Following this image, the tube was flushed with 50 mL of water.  Scattered surgical clips in the abdomen are stable. Non obstructed bowel gas pattern. Abdominal and pelvic visceral contours are stable. No acute osseous abnormality identified.  IMPRESSION: Percutaneous J-tube with no adverse features.   Electronically Signed   By: Lars Pinks M.D.   On: 09/01/2014 16:13   Dg Chest Port 1 View  09/01/2014   CLINICAL DATA:  63 year old female with history of sleep apnea, gastric cancer and pleural effusions. Prior smoker. Subsequent encounter.  EXAM: PORTABLE CHEST - 1 VIEW  COMPARISON:  08/27/2014 CT.  01/09/2013 chest x-ray.  FINDINGS: Medial left upper lobe consolidation with left apical pleural thickening similar to prior exam. Etiology indeterminate.  Pulmonary vascular congestion/ pulmonary edema with bilateral pleural effusions. Passive atelectasis lung bases suspected. Difficult to exclude basilar infiltrate given the pleural effusions/atelectasis.  No gross pneumothorax.  Calcified aorta.  Heart size within normal limits.  Surgical clips left upper quadrant.  IMPRESSION: Pulmonary vascular congestion/ pulmonary edema with bilateral pleural effusions. Passive atelectasis lung bases suspected. Difficult to exclude basilar infiltrate given the pleural effusions/atelectasis.  Medial left upper lobe consolidation with left apical pleural thickening similar to prior exam. Etiology indeterminate.   Electronically Signed   By: Chauncey Cruel M.D.   On: 09/01/2014 07:36    Medications: .  ceFAZolin (ANCEF) IV  2 g  Intravenous On Call  . docusate sodium  100 mg Oral BID  . enoxaparin (LOVENOX) injection  40 mg Subcutaneous Q24H  . free water  100 mL Per Tube 3 times per day  . levothyroxine  37.5 mcg Intravenous QAC breakfast  . ondansetron (ZOFRAN) IV  16 mg Intravenous 3 times per day  . pantoprazole (PROTONIX) IV  40 mg Intravenous Q24H    Assessment/Plan 1.  Exploratory Laparoscopy, abdominal biopsy and J tube placement - 08/27/2014 - A.  Thomas 2. Metastatic gastric cancer - peritoneal mets Seen by Dr. Ky Barban - plans for chemotx 3. History of Hodgkins disease - remote 4. OSA 5. HTN 6. DVT prophylaxis - Lovenox 7. Hypokalemia - K+ - 3.2 - 08/31/2014 - a little better 8. Nausea and vomiting/ileus   Plan:  I have restarted the TF, and we will let her advance as tolerated.      LOS: 7 days    Sabrina Melton 09/02/2014

## 2014-09-03 LAB — GLUCOSE, CAPILLARY
GLUCOSE-CAPILLARY: 109 mg/dL — AB (ref 70–99)
GLUCOSE-CAPILLARY: 110 mg/dL — AB (ref 70–99)
GLUCOSE-CAPILLARY: 121 mg/dL — AB (ref 70–99)
Glucose-Capillary: 106 mg/dL — ABNORMAL HIGH (ref 70–99)
Glucose-Capillary: 110 mg/dL — ABNORMAL HIGH (ref 70–99)
Glucose-Capillary: 117 mg/dL — ABNORMAL HIGH (ref 70–99)
Glucose-Capillary: 95 mg/dL (ref 70–99)

## 2014-09-03 MED ORDER — LEVOTHYROXINE SODIUM 75 MCG PO TABS
75.0000 ug | ORAL_TABLET | Freq: Every day | ORAL | Status: DC
  Administered 2014-09-04 – 2014-09-08 (×5): 75 ug via JEJUNOSTOMY
  Filled 2014-09-03 (×9): qty 1

## 2014-09-03 MED ORDER — METOCLOPRAMIDE HCL 5 MG/ML IJ SOLN
5.0000 mg | Freq: Four times a day (QID) | INTRAMUSCULAR | Status: DC
  Administered 2014-09-03 – 2014-09-04 (×4): 5 mg via INTRAVENOUS
  Filled 2014-09-03 (×3): qty 1
  Filled 2014-09-03 (×2): qty 2
  Filled 2014-09-03: qty 1
  Filled 2014-09-03 (×2): qty 2

## 2014-09-03 NOTE — Progress Notes (Signed)
Pt had one loose stool this am, brown in color. Will cont to monitor patient. T/F upped to 13ml/hr.

## 2014-09-03 NOTE — Progress Notes (Signed)
NUTRITION FOLLOW UP  Intervention:   -Osmolite 1.2  At 20 ml/hr. Do not advance per MD.  When ready to increase, recommend:  increase 10 ml every 12 hours to goal rate of 65 ml/hr per J-tube.   -Due to jejunal tube placement, pt would not benefit from bolus feeding schedule; will require cyclic continuous tube feeding -When stable for d/c; recommend:  -Home regimen:Initiate Osmolite 1.2 at 65 ml/hr at 7pm, advance by 10 ml every 4 hours until goal rate of 120 ml/hr achieved. Hold tube feeding, and restart next scheduled feed at 120 ml/hr. Run continuous for 13 hours to meet estimated nutrition needs.  -Osmolite 1.2 at goal rate of 120 ml/hr for 13 hours (7pm-8am) via Jtube to provide 1872 kcal (100% est kcal need), 87 gram protein (100% est protein needs), and 1279 ml free H2O. Will require additional 920 ml free water via flushes (approximately 230 ml (8 oz) four times daily)  Nutrition Dx:   Inadequate oral intake related to inability to eat as evidenced by NPO status; ongoing  Goal:   TF to meet >/= 90% of their estimated nutrition needs; progressing    Monitor:   TF tolerance, refeeding labs, total protein/energy intake, labs, weights, GI profile  Assessment:   -Pt's husband endorsed an unintentional weight loss of 40 lb in 2 months; however previous medical records indicate 20 lbs since 06/2014, and 40 lbs since 04/2014 (20% body weight loss in < 6 months, severe for time frame) -Diet recall indicates pt with minimal intake for past two months, largest meal in past 2 weeks with 1/2 grilled cheese sandwich and soup. Husband has been encouraging gatorade and fluids; however pt has been unable to tolerate any liquids or solids for past one week d/t nausea and emesis -Pt s/p Jtube placement -Surgery recommended trickle feeds for 24 hours. Will place recommendations for goal rate, and bolus feed recommendations to implement upon d/c. Nothing by mouth at this time. -Family concerned with loose  stools and skin care. Relayed to RN family concerns, and RD will follow up for additional home TF nutrition concerns.  -K low, Phos/Mg WNL. Monitor with initiation of EN and replete as needed -Admitted dehydrated; monitor fluid needs   11/13: -Pt tolerating Osmolite 1.2 at 15 ml/hr trickle feeds -Surgery requesting to continue with trickle feeds d/t concern for ileus -Pt will require continuous feeds vs bolus feeds upon d/c d/t Jtube placement. See Intervention section for d/c instructions -Continues to be at risk for refeeding, recommended refeeding labs be run when TF at 50% goal (approx 30 ml/hr) -RD provided pt and family with [employer] nutrition education booklet regarding home tube feeding instruction  11/15:   -Patient with several episodes of nausea and vomiting which she states looks like TF.  No flatus or BM.  TF held. Concern for ileus.  11/18: - Nurse reports patient complained of nausea when TF increased. - Patient had nausea prior to J-tube placement - Loose BM 11/17 and 11/18. - Concerns of ileus per MD  Height: Ht Readings from Last 1 Encounters:  08/26/14 5\' 6"  (1.676 m)    Weight Status:   Wt Readings from Last 1 Encounters:  09/03/14 190 lb 3.2 oz (86.274 kg)    Re-estimated needs:  Kcal: 1750-1950 Protein: 85-95 gram Fluid: >/= 2200 ml daily  Skin: WDL  Diet Order:     Intake/Output Summary (Last 24 hours) at 09/03/14 1531 Last data filed at 09/03/14 1107  Gross per 24 hour  Intake    600 ml  Output    650 ml  Net    -50 ml    Last BM: 11/04   Labs:   Recent Labs Lab 08/30/14 0400 08/31/14 0423 08/31/14 0951 09/01/14 0430 09/02/14 0418  NA 138 139  --  142  --   K 3.0* 3.2*  --  3.9  --   CL 98 100  --  105  --   CO2 27 27  --  27  --   BUN 6 7  --  7  --   CREATININE 0.61 0.67  --  0.67 0.69  CALCIUM 8.4 8.3*  --  8.2*  --   MG  --   --  1.5  --   --   PHOS  --  3.0  --   --   --   GLUCOSE 122* 122*  --  115*  --     CBG  (last 3)   Recent Labs  09/03/14 0415 09/03/14 0755 09/03/14 1232  GLUCAP 110* 95 109*    Scheduled Meds: . enoxaparin (LOVENOX) injection  40 mg Subcutaneous Q24H  . free water  100 mL Per Tube 3 times per day  . [START ON 09/04/2014] levothyroxine  75 mcg Per J Tube QAC breakfast  . metoCLOPramide (REGLAN) injection  5 mg Intravenous 4 times per day  . ondansetron (ZOFRAN) IV  16 mg Intravenous 3 times per day  . pantoprazole (PROTONIX) IV  40 mg Intravenous Q24H    Continuous Infusions: . 0.9 % NaCl with KCl 20 mEq / L 75 mL/hr at 09/03/14 1007  . feeding supplement (OSMOLITE 1.2 CAL) 1,000 mL (09/01/14 0522)  . sodium chloride 0.9 % 1,000 mL with potassium chloride 40 mEq infusion 50 mL/hr at 09/01/14 [place], RD, LDN Clinical Inpatient Dietitian Pager:  [phone] Weekend and after hours pager:  [phone]

## 2014-09-03 NOTE — Progress Notes (Signed)
Patient ID: Sabrina Melton, female   DOB: [birth year], 63 y.o.   MRN: [id number]    Pt had been tentatively scheduled for [facility] a cath placement earlier this admission Was cancelled secondary leukocytosis  Plan was to evaluate leukocytosis and have PAC possibly placed as OP Discussed and confirmed with Dr Maryland Pink  Will await to see pt as OP for [facility] Please call IR if any issue or concern--[phone]

## 2014-09-03 NOTE — Progress Notes (Signed)
7 Days Post-Op  Subjective: Stable from our standpoint.  I don't see an issue with the J tube.  She may still have an ileus, not much in the way of bowel sounds.  She has had a loose BM.  Objective: Vital signs in last 24 hours: Temp:  [98.4 F (36.9 C)-98.9 F (37.2 C)] 98.4 F (36.9 C) (11/18 0501) Pulse Rate:  [106-109] 107 (11/18 0501) Resp:  [16] 16 (11/18 0501) BP: (127-141)/(66-75) 141/66 mmHg (11/18 0501) SpO2:  [93 %-96 %] 96 % (11/18 0501) Weight:  [86.274 kg (190 lb 3.2 oz)] 86.274 kg (190 lb 3.2 oz) (11/18 0501) Last BM Date: 09/01/14 +BM recorded Afebrile, HR up and VSS No labs Intake/Output from previous day: 11/17 0701 - 11/18 0700 In: 0  Out: 350 [Urine:350] Intake/Output this shift:    General appearance: alert, cooperative and no distress GI: soft still sore, site looks fine, staples look fine.    Lab Results:   Recent Labs  09/01/14 0430 09/02/14 0418  WBC 13.2* 12.7*  HGB 11.1* 11.3*  HCT 33.5* 35.0*  PLT 343 351    BMET  Recent Labs  09/01/14 0430 09/02/14 0418  NA 142  --   K 3.9  --   CL 105  --   CO2 27  --   GLUCOSE 115*  --   BUN 7  --   CREATININE 0.67 0.69  CALCIUM 8.2*  --    PT/INR No results for input(s): LABPROT, INR in the last 72 hours.  No results for input(s): AST, ALT, ALKPHOS, BILITOT, PROT, ALBUMIN in the last 168 hours.   Lipase  No results found for: LIPASE   Studies/Results: Dg Abd 1 View  09/01/2014   CLINICAL DATA:  63 year old female with percutaneous jejunostomy tube requiring placement verification. Initial encounter.  EXAM: ABDOMEN - 1 VIEW  COMPARISON:  Abdominal radiographs 08/26/2014 and earlier.  CONTRAST:  50 mL Omnipaque 300  FINDINGS: KUB of the abdomen at 1546 hr following contrast injection through the indwelling J tube. Several small bowel loops are opacified in the left mid abdomen. No evidence of abnormal contrast accumulation or leak. Following this image, the tube was flushed with 50 mL of  water.  Scattered surgical clips in the abdomen are stable. Non obstructed bowel gas pattern. Abdominal and pelvic visceral contours are stable. No acute osseous abnormality identified.  IMPRESSION: Percutaneous J-tube with no adverse features.   Electronically Signed   By: Lars Pinks M.D.   On: 09/01/2014 16:13    Medications: . docusate sodium  100 mg Oral BID  . enoxaparin (LOVENOX) injection  40 mg Subcutaneous Q24H  . free water  100 mL Per Tube 3 times per day  . levothyroxine  37.5 mcg Intravenous QAC breakfast  . ondansetron (ZOFRAN) IV  16 mg Intravenous 3 times per day  . pantoprazole (PROTONIX) IV  40 mg Intravenous Q24H    Assessment/Plan 1. Exploratory Laparoscopy, abdominal biopsy and J tube placement - 08/27/2014 - A. Thomas 2. Metastatic gastric cancer - peritoneal mets Seen by Dr. Ky Barban - plans for chemotx 3. History of Hodgkins disease - remote 4. OSA 5. HTN 6. DVT prophylaxis - Lovenox 7. Hypokalemia - K+ - 3.2 - 08/31/2014 - a little better 8. Nausea and vomiting/ileus   Plan:  I have told Dr. Maryland Pink he could do liquid only medicines in the J tube, no crushed pills of any type.  I have discussed this with the patient and her husband  too. I will let the take a shower and dry site first, and redress.  She really wants to get her hair washed.  I have an appointment for her to follow up and get her staples out on 09/19/14 with Dr. Marcello Moores.  We will be available as needed.  Call anytime.    LOS: 8 days    Gerilynn Mccullars 09/03/2014

## 2014-09-03 NOTE — Progress Notes (Signed)
[facility] PROGRESS NOTE  Interim summary 63 y.o. female With history of recently diagnosed gastric cancer, hypothyroidism, gastroesophageal reflux disease, hypertension, depression, prior history of Hodgkin's disease who presented at oncologist's office for evaluation. Patient was noted to be orthostatic and noted to be tachycardic and dehydrated and a such patient was sent to the hospital as a direct admission for further evaluation and management. Course complicated with ileus after J tube placed.    Assessment/Plan: Nausea with vomiting: - Likely due to gastric cancer, General surgery service placed J-tube on 11.11.2015.  -J tube evaluation done on 11/16; working as intended.  -patient still with some intermittent nausea and some spitting; overall slowly improving. -She did have 2 loose stools. Will add reglan to help with her symptoms and perhaps improve motility.  -Will advance feeds to 40ml/hr.  -continue PRN zofran and phenergan; also with compazine prescription for refractory symptoms -Discussed use of J tube with general surgery. Can give liquids medications through it but no crushed medications. Will see if Levothyroxine can be given after dissolving in water, Pharmacy to look at this issue.  -Patient cannot have anything by mouth due to her cancer.  Dehydration and hypokalemia  -Improved with IVF'. Potassium repleted.   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 -CT chest showed sub-carina lymph node. -Patient to get chemotherapy as outpatient. Port A cath placement cancelled due to mild unspecified leukocytosis.  -placement time to be decided in outpatient settings  Severe protein caloric malnutrition: -J tube in place; not fully receiving appropriate rate for nutrition yet -patient intestinal activity is not fully restored. Reglan might help.   -plan is to advance rate to desire goal before  discharge -maintain elevated head  Orthostasis Hypotension: - Resolved. - Continue IVF's maintenance for now  Hypothyroidism - TSH 7.0.  - Will see if Synthroid can be fully dissolved in water before administering through feeding tube.   Gastroesophageal reflux disease: -continue PPI; currently IV  Obstructive sleep apnea Continue CPAP at bedtime; patient has been refusing it  Depression: Overall Stable. -patient was not taking any meds at home -continue supportive care  Leukocytosis Likely due to cancer and acute illness. No fever. No obvious source of infection. Hold off on antibiotics. Improving as well. Recheck in AM.  Code Status: Full DVT Prophylaxis: Lovenox Family Communication: no family at bedside Disposition Plan: Not ready for discharge.   Consultants:  Oncology  Surgery  IR  Procedures:  J tube placement  Antibiotics:  none  Subjective: Feels well. Slightly better wrt nausea than before. 2 loose BM's overnight. Still with some nausea, no vomiting, but with episodes of spiting up (formula material).  Objective: Filed Vitals:   09/02/14 0449 09/02/14 1340 09/02/14 2008 09/03/14 0501  BP: 117/64 132/75 127/73 141/66  Pulse: 106 106 109 107  Temp: 98.4 F (36.9 C) 98.9 F (37.2 C) 98.7 F (37.1 C) 98.4 F (36.9 C)  TempSrc: Oral Oral Oral Oral  Resp: 16 16 16 16   Height:      Weight: 84.233 kg (185 lb 11.2 oz)   86.274 kg (190 lb 3.2 oz)  SpO2: 94% 96% 93% 96%    Intake/Output Summary (Last 24 hours) at 09/03/14 0939 Last data filed at 09/03/14 0501  Gross per 24 hour  Intake      0 ml  Output    350 ml  Net   -350 ml   Filed Weights   09/01/14 0521 09/02/14 0449 09/03/14  0501  Weight: 81.285 kg (179 lb 3.2 oz) 84.233 kg (185 lb 11.2 oz) 86.274 kg (190 lb 3.2 oz)    Exam:  General: Alert, awake, oriented x3, in no acute distress. No Fever; denies CP or SOB HEENT: No bruits, no goiter; no JVD Heart: Regular rate and rhythm; No  rubs or gallops Lungs: Good air movement, no wheezing, no rales Abdomen: Soft, J-tube in place; no BS appreciated   Data Reviewed: Basic Metabolic Panel:  Recent Labs Lab 08/29/14 0511 08/30/14 0400 08/31/14 0423 08/31/14 0951 09/01/14 0430 09/02/14 0418  NA 140 138 139  --  142  --   K 3.6* 3.0* 3.2*  --  3.9  --   CL 102 98 100  --  105  --   CO2 26 27 27   --  27  --   GLUCOSE 134* 122* 122*  --  115*  --   BUN 7 6 7   --  7  --   CREATININE 0.73 0.61 0.67  --  0.67 0.69  CALCIUM 8.3* 8.4 8.3*  --  8.2*  --   MG  --   --   --  1.5  --   --   PHOS  --   --  3.0  --   --   --    CBC:  Recent Labs Lab 09/01/14 0430 09/02/14 0418  WBC 13.2* 12.7*  HGB 11.1* 11.3*  HCT 33.5* 35.0*  MCV 99.1 101.4*  PLT 343 351   CBG:  Recent Labs Lab 09/02/14 1612 09/02/14 2006 09/03/14 0006 09/03/14 0415 09/03/14 0755  GLUCAP 86 99 106* 110* 95    Recent Results (from the past 240 hour(s))  Urine culture     Status: None   Collection Time: 08/26/14  6:54 PM  Result Value Ref Range Status   Specimen Description URINE, RANDOM  Final   Special Requests NONE  Final   Culture  Setup Time   Final    08/26/2014 22:23 Performed at [facility]   Final    >=100,000 COLONIES/ML Performed at [facility]    Culture   Final    Multiple bacterial morphotypes present, none predominant. Suggest appropriate recollection if clinically indicated. Performed at [facility]    Report Status 08/27/2014 FINAL  Final     Studies: Dg Abd 1 View  09/01/2014   CLINICAL DATA:  62 year old female with percutaneous jejunostomy tube requiring placement verification. Initial encounter.  EXAM: ABDOMEN - 1 VIEW  COMPARISON:  Abdominal radiographs 08/26/2014 and earlier.  CONTRAST:  50 mL Omnipaque 300  FINDINGS: KUB of the abdomen at 1546 hr following contrast injection through the indwelling J tube. Several small bowel loops are opacified in the left mid  abdomen. No evidence of abnormal contrast accumulation or leak. Following this image, the tube was flushed with 50 mL of water.  Scattered surgical clips in the abdomen are stable. Non obstructed bowel gas pattern. Abdominal and pelvic visceral contours are stable. No acute osseous abnormality identified.  IMPRESSION: Percutaneous J-tube with no adverse features.   Electronically Signed   By: Lars Pinks M.D.   On: 09/01/2014 16:13    Scheduled Meds: . enoxaparin (LOVENOX) injection  40 mg Subcutaneous Q24H  . free water  100 mL Per Tube 3 times per day  . levothyroxine  37.5 mcg Intravenous QAC breakfast  . metoCLOPramide (REGLAN) injection  5 mg Intravenous 4 times per day  .  ondansetron (ZOFRAN) IV  16 mg Intravenous 3 times per day  . pantoprazole (PROTONIX) IV  40 mg Intravenous Q24H   Continuous Infusions: . 0.9 % NaCl with KCl 20 mEq / L 75 mL/hr at 09/02/14 1939  . feeding supplement (OSMOLITE 1.2 CAL) 1,000 mL (09/01/14 0522)  . sodium chloride 0.9 % 1,000 mL with potassium chloride 40 mEq infusion 50 mL/hr at 09/01/14 1425   Time: 25 minutes  [facility] Pager [phone].   If 7PM-7AM, please contact night-coverage at [email or web address], password [facility] 09/03/2014, 9:39 AM  LOS: 8 days

## 2014-09-04 DIAGNOSIS — D72829 Elevated white blood cell count, unspecified: Secondary | ICD-10-CM

## 2014-09-04 DIAGNOSIS — R609 Edema, unspecified: Secondary | ICD-10-CM

## 2014-09-04 LAB — GLUCOSE, CAPILLARY
GLUCOSE-CAPILLARY: 112 mg/dL — AB (ref 70–99)
GLUCOSE-CAPILLARY: 130 mg/dL — AB (ref 70–99)
Glucose-Capillary: 138 mg/dL — ABNORMAL HIGH (ref 70–99)
Glucose-Capillary: 136 mg/dL — ABNORMAL HIGH (ref 70–99)
Glucose-Capillary: 103 mg/dL — ABNORMAL HIGH (ref 70–99)

## 2014-09-04 LAB — CBC
HEMATOCRIT: 34.7 % — AB (ref 36.0–46.0)
Hemoglobin: 11.5 g/dL — ABNORMAL LOW (ref 12.0–15.0)
MCH: 32.5 pg (ref 26.0–34.0)
MCHC: 33.1 g/dL (ref 30.0–36.0)
MCV: 98 fL (ref 78.0–100.0)
Platelets: 469 10*3/uL — ABNORMAL HIGH (ref 150–400)
RBC: 3.54 MIL/uL — ABNORMAL LOW (ref 3.87–5.11)
RDW: 13.4 % (ref 11.5–15.5)
WBC: 14.5 10*3/uL — AB (ref 4.0–10.5)

## 2014-09-04 LAB — BASIC METABOLIC PANEL
Anion gap: 12 (ref 5–15)
BUN: 5 mg/dL — AB (ref 6–23)
CHLORIDE: 101 meq/L (ref 96–112)
CO2: 23 mEq/L (ref 19–32)
CREATININE: 0.6 mg/dL (ref 0.50–1.10)
Calcium: 8.4 mg/dL (ref 8.4–10.5)
GFR calc Af Amer: >90 mL/min (ref >90)
GFR calc non Af Amer: >90 mL/min (ref >90)
Glucose, Bld: 128 mg/dL — ABNORMAL HIGH (ref 70–99)
Potassium: 3.8 mEq/L (ref 3.7–5.3)
Sodium: 136 mEq/L — ABNORMAL LOW (ref 137–147)

## 2014-09-04 MED ORDER — OSMOLITE 1.2 CAL PO LIQD
1000.0000 mL | ORAL | Status: DC
  Administered 2014-09-04: 1000 mL

## 2014-09-04 MED ORDER — SODIUM CHLORIDE 0.9 % IV BOLUS (SEPSIS)
500.0000 mL | Freq: Once | INTRAVENOUS | Status: AC
  Administered 2014-09-04: 500 mL via INTRAVENOUS

## 2014-09-04 MED ORDER — PANTOPRAZOLE SODIUM 40 MG PO PACK
40.0000 mg | PACK | Freq: Two times a day (BID) | ORAL | Status: DC
  Administered 2014-09-04 – 2014-09-08 (×10): 40 mg
  Filled 2014-09-04 (×15): qty 20

## 2014-09-04 MED ORDER — METOCLOPRAMIDE HCL 5 MG/5ML PO SOLN
10.0000 mg | Freq: Three times a day (TID) | ORAL | Status: DC
  Administered 2014-09-04 – 2014-09-08 (×20): 10 mg
  Filled 2014-09-04 (×25): qty 10

## 2014-09-04 MED ORDER — MORPHINE SULFATE (CONCENTRATE) 10 MG/0.5ML PO SOLN
10.0000 mg | ORAL | Status: DC | PRN
  Administered 2014-09-04 – 2014-09-09 (×16): 10 mg
  Filled 2014-09-04 (×16): qty 0.5

## 2014-09-04 NOTE — Progress Notes (Signed)
VASCULAR LAB PRELIMINARY  PRELIMINARY  PRELIMINARY  PRELIMINARY  Left upper extremity venous duplex completed.    Preliminary report:  Left - No evidence of deep vein thrombosis of the left upper extremity. Positive for a superficial thrombus of the left cephalic vein coursing from the mid forearm to the mid upper arm.  Breckon Reeves, RVS 09/04/2014, 3:55 PM

## 2014-09-04 NOTE — Progress Notes (Signed)
Pt's temperature is 100.2. Will cont to monitor her.

## 2014-09-04 NOTE — Progress Notes (Signed)
[facility] PROGRESS NOTE  Interim summary 63 y.o. female With history of recently diagnosed gastric cancer, hypothyroidism, gastroesophageal reflux disease, hypertension, depression, prior history of Hodgkin's disease who presented at oncologist's office for evaluation. Patient was noted to be orthostatic and noted to be tachycardic and dehydrated and a such patient was sent to the hospital as a direct admission for further evaluation and management. Course complicated with ileus after J tube placed.    Assessment/Plan: Nausea with vomiting: - Likely due to gastric cancer,  - General surgery service placed J-tube on 11.11.2015 to provide nutrition. -J tube evaluation done on 11/16; working as intended.  -Patient still with some intermittent nausea and some spitting. This is most likely due to her cancer. -She did have 2 loose stools. Change to Reglan via j tube and increase dose. This may help with her symptoms and perhaps improve motility.  -Will advance feeds to 54ml/hr.  -continue PRN zofran and phenergan; also with compazine prescription for refractory symptoms -Discussed use of J tube with general surgery. Can give liquids medications through it but no crushed medications.  -Patient cannot have anything by mouth due to her cancer.  Dehydration and hypokalemia  -Improved with IVF. Potassium repleted.   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 -CT chest showed sub-carina lymph node. -Patient to get chemotherapy as outpatient. Port A cath placement cancelled due to mild unspecified leukocytosis.  -placement time to be decided in outpatient settings  Severe protein caloric malnutrition: -J tube in place; not fully receiving appropriate rate for nutrition yet -patient intestinal activity is not fully restored. Reglan might help.   -plan is to advance rate to desire goal before discharge -maintain elevated  head  Orthostasis Hypotension: - Resolved. - Continue IVF's maintenance for now  Hypothyroidism - TSH 7.0.  - Will see if Synthroid can be fully dissolved in water before administering through feeding tube.   Gastroesophageal reflux disease: -continue PPI; currently IV  Obstructive sleep apnea Continue CPAP at bedtime; patient has been refusing it  Depression: Overall Stable. -patient was not taking any meds at home -continue supportive care  Leukocytosis Likely due to cancer and acute illness. No fever. No obvious source of infection. Hold off on antibiotics. Give fluid bolus as platelet count also rising. Recheck in AM.  Code Status: Full DVT Prophylaxis: Lovenox Family Communication: Discussed with husband and patient Disposition Plan: Not ready for discharge.   Consultants:  Oncology  Surgery  IR  Procedures:  J tube placement  Antibiotics:  none  Subjective: Feels same. Still with nausea and occasional spitting up of feeds.   Objective: Filed Vitals:   09/03/14 0501 09/03/14 1602 09/03/14 2058 09/04/14 0404  BP: 141/66 129/64 154/88 148/81  Pulse: 107 112 124 113  Temp: 98.4 F (36.9 C) 98.7 F (37.1 C) 98.8 F (37.1 C) 98.4 F (36.9 C)  TempSrc: Oral Oral Oral Oral  Resp: 16 16 18 18   Height:      Weight: 86.274 kg (190 lb 3.2 oz)   85.73 kg (189 lb)  SpO2: 96% 92% 96% 95%    Intake/Output Summary (Last 24 hours) at 09/04/14 0801 Last data filed at 09/04/14 0509  Gross per 24 hour  Intake 1990.2 ml  Output    450 ml  Net 1540.2 ml   Filed Weights   09/02/14 0449 09/03/14 0501 09/04/14 0404  Weight: 84.233 kg (185 lb 11.2 oz) 86.274 kg (190 lb 3.2 oz) 85.73 kg (189  lb)    Exam:  General: Alert, awake, oriented x3, in no acute distress. No Fever; denies CP or SOB Heart: Regular rate and rhythm; No rubs or gallops Lungs: Good air movement, no wheezing, no rales Abdomen: Soft, J-tube in place; BS present   Data Reviewed: Basic  Metabolic Panel:  Recent Labs Lab 08/29/14 0511 08/30/14 0400 08/31/14 0423 08/31/14 0951 09/01/14 0430 09/02/14 0418 09/04/14 0420  NA 140 138 139  --  142  --  136*  K 3.6* 3.0* 3.2*  --  3.9  --  3.8  CL 102 98 100  --  105  --  101  CO2 26 27 27   --  27  --  23  GLUCOSE 134* 122* 122*  --  115*  --  128*  BUN 7 6 7   --  7  --  5*  CREATININE 0.73 0.61 0.67  --  0.67 0.69 0.60  CALCIUM 8.3* 8.4 8.3*  --  8.2*  --  8.4  MG  --   --   --  1.5  --   --   --   PHOS  --   --  3.0  --   --   --   --    CBC:  Recent Labs Lab 09/01/14 0430 09/02/14 0418 09/04/14 0420  WBC 13.2* 12.7* 14.5*  HGB 11.1* 11.3* 11.5*  HCT 33.5* 35.0* 34.7*  MCV 99.1 101.4* 98.0  PLT 343 351 469*   CBG:  Recent Labs Lab 09/03/14 1605 09/03/14 2007 09/03/14 2338 09/04/14 0402 09/04/14 0743  GLUCAP 117* 110* 121* 138* 136*    Recent Results (from the past 240 hour(s))  Urine culture     Status: None   Collection Time: 08/26/14  6:54 PM  Result Value Ref Range Status   Specimen Description URINE, RANDOM  Final   Special Requests NONE  Final   Culture  Setup Time   Final    08/26/2014 22:23 Performed at [place]   Final    >=100,000 COLONIES/ML Performed at [facility]    Culture   Final    Multiple bacterial morphotypes present, none predominant. Suggest appropriate recollection if clinically indicated. Performed at [facility]    Report Status 08/27/2014 FINAL  Final     Studies: No results found.  Scheduled Meds: . enoxaparin (LOVENOX) injection  40 mg Subcutaneous Q24H  . free water  100 mL Per Tube 3 times per day  . levothyroxine  75 mcg Per J Tube QAC breakfast  . metoCLOPramide  10 mg Per Tube TID AC & HS  . ondansetron (ZOFRAN) IV  16 mg Intravenous 3 times per day  . pantoprazole sodium  40 mg Per Tube BID   Continuous Infusions: . 0.9 % NaCl with KCl 20 mEq / L 75 mL/hr at 09/04/14 0034  . feeding supplement  (OSMOLITE 1.2 CAL)    . sodium chloride 0.9 % 1,000 mL with potassium chloride 40 mEq infusion 50 mL/hr at 09/01/14 1425   Time: 25 minutes  [facility] Pager [phone].   If 7PM-7AM, please contact night-coverage at [email or web address], password [facility] 09/04/2014, 8:01 AM  LOS: 9 days

## 2014-09-04 NOTE — Progress Notes (Signed)
PT Cancellation Note  Patient Details Name: Sabrina Melton MRN: [id number] DOB: 1951-06-05   Cancelled Treatment:     awaiting Doppler results   Rica Koyanagi  PTA [facility]  Acute  Rehab Pager      [phone]

## 2014-09-05 ENCOUNTER — Inpatient Hospital Stay (HOSPITAL_COMMUNITY): Payer: BC Managed Care – PPO

## 2014-09-05 DIAGNOSIS — R509 Fever, unspecified: Secondary | ICD-10-CM

## 2014-09-05 LAB — GLUCOSE, CAPILLARY
Glucose-Capillary: 106 mg/dL — ABNORMAL HIGH (ref 70–99)
Glucose-Capillary: 111 mg/dL — ABNORMAL HIGH (ref 70–99)
Glucose-Capillary: 118 mg/dL — ABNORMAL HIGH (ref 70–99)
Glucose-Capillary: 123 mg/dL — ABNORMAL HIGH (ref 70–99)
Glucose-Capillary: 127 mg/dL — ABNORMAL HIGH (ref 70–99)
Glucose-Capillary: 142 mg/dL — ABNORMAL HIGH (ref 70–99)

## 2014-09-05 LAB — BASIC METABOLIC PANEL
ANION GAP: 13 (ref 5–15)
BUN: 8 mg/dL (ref 6–23)
CO2: 25 meq/L (ref 19–32)
Calcium: 8.1 mg/dL — ABNORMAL LOW (ref 8.4–10.5)
Chloride: 104 mEq/L (ref 96–112)
Creatinine, Ser: 1.18 mg/dL — ABNORMAL HIGH (ref 0.50–1.10)
GFR calc Af Amer: 56 mL/min — ABNORMAL LOW (ref >90)
GFR, EST NON AFRICAN AMERICAN: 48 mL/min — AB (ref >90)
Glucose, Bld: 133 mg/dL — ABNORMAL HIGH (ref 70–99)
POTASSIUM: 4.8 meq/L (ref 3.7–5.3)
SODIUM: 142 meq/L (ref 137–147)

## 2014-09-05 LAB — CBC
HCT: 33.1 % — ABNORMAL LOW (ref 36.0–46.0)
Hemoglobin: 10.9 g/dL — ABNORMAL LOW (ref 12.0–15.0)
MCH: 32.7 pg (ref 26.0–34.0)
MCHC: 32.9 g/dL (ref 30.0–36.0)
MCV: 99.4 fL (ref 78.0–100.0)
PLATELETS: 366 10*3/uL (ref 150–400)
RBC: 3.33 MIL/uL — AB (ref 3.87–5.11)
RDW: 13.6 % (ref 11.5–15.5)
WBC: 24.2 10*3/uL — ABNORMAL HIGH (ref 4.0–10.5)

## 2014-09-05 LAB — URINE MICROSCOPIC-ADD ON

## 2014-09-05 LAB — URINALYSIS, ROUTINE W REFLEX MICROSCOPIC
Glucose, UA: NEGATIVE mg/dL
Hgb urine dipstick: NEGATIVE
Ketones, ur: NEGATIVE mg/dL
Nitrite: NEGATIVE
Protein, ur: 30 mg/dL — AB
Specific Gravity, Urine: 1.036 — ABNORMAL HIGH (ref 1.005–1.030)
Urobilinogen, UA: 1 mg/dL (ref 0.0–1.0)
pH: 5.5 (ref 5.0–8.0)

## 2014-09-05 LAB — PROCALCITONIN: Procalcitonin: 10.87 ng/mL

## 2014-09-05 MED ORDER — VANCOMYCIN HCL 10 G IV SOLR
1500.0000 mg | Freq: Once | INTRAVENOUS | Status: AC
  Administered 2014-09-05: 1500 mg via INTRAVENOUS
  Filled 2014-09-05: qty 1500

## 2014-09-05 MED ORDER — BACITRACIN-NEOMYCIN-POLYMYXIN OINTMENT TUBE
TOPICAL_OINTMENT | Freq: Two times a day (BID) | CUTANEOUS | Status: DC
  Administered 2014-09-05 – 2014-09-08 (×6): via TOPICAL
  Filled 2014-09-05: qty 15

## 2014-09-05 MED ORDER — PIPERACILLIN-TAZOBACTAM 3.375 G IVPB
3.3750 g | Freq: Three times a day (TID) | INTRAVENOUS | Status: DC
  Administered 2014-09-05 – 2014-09-08 (×9): 3.375 g via INTRAVENOUS
  Filled 2014-09-05 (×11): qty 50

## 2014-09-05 MED ORDER — VANCOMYCIN HCL IN DEXTROSE 750-5 MG/150ML-% IV SOLN
750.0000 mg | Freq: Two times a day (BID) | INTRAVENOUS | Status: DC
  Administered 2014-09-05: 750 mg via INTRAVENOUS
  Filled 2014-09-05 (×2): qty 150

## 2014-09-05 MED ORDER — OSMOLITE 1.2 CAL PO LIQD
1000.0000 mL | ORAL | Status: DC
  Administered 2014-09-06 – 2014-09-07 (×2): 1000 mL
  Filled 2014-09-05 (×2): qty 1000

## 2014-09-05 MED ORDER — OSMOLITE 1.2 CAL PO LIQD
1000.0000 mL | ORAL | Status: AC
  Administered 2014-09-05: 1000 mL
  Filled 2014-09-05: qty 1000

## 2014-09-05 NOTE — Plan of Care (Signed)
Problem: Consults Goal: General Medical Patient Education See Patient Education Module for specific education.  Outcome: Completed/Met Date Met:  09/05/14 Goal: Skin Care Protocol Initiated - if Braden Score 18 or less If consults are not indicated, leave blank or document N/A  Outcome: Not Applicable Date Met:  48/01/65 Goal: Diabetes Guidelines if Diabetic/Glucose > 140 If diabetic or lab glucose is > 140 mg/dl - Initiate Diabetes/Hyperglycemia Guidelines & Document Interventions  Outcome: Completed/Met Date Met:  09/05/14  Problem: Phase I Progression Outcomes Goal: Other Phase I Outcomes/Goals Outcome: Not Applicable Date Met:  53/74/82

## 2014-09-05 NOTE — Progress Notes (Signed)
PT Cancellation Note  Patient Details Name: Sabrina Melton MRN: [id number] DOB: 03-05-1951   Cancelled Treatment:    Reason Eval/Treat Not Completed: Patient at procedure or test/unavailable (pt at x-ray).  Will follow.    Blondell Reveal Kistler 09/05/2014, 10:48 AM [phone]

## 2014-09-05 NOTE — Progress Notes (Signed)
ANTIBIOTIC CONSULT NOTE - INITIAL  Pharmacy Consult for vancomycin and Zosyn Indication: FUO, new leukocytosis  No Known Allergies  Patient Measurements: Height: 5\' 6"  (167.6 cm) Weight: 187 lb 6.4 oz (85.004 kg) IBW/kg (Calculated) : 59.3 Adjusted Body Weight: 70kg  Vital Signs: Temp: 100.7 F (38.2 C) (11/20 0550) Temp Source: Oral (11/20 0550) BP: 97/56 mmHg (11/20 0550) Pulse Rate: 125 (11/20 0550) Intake/Output from previous day: 11/19 0701 - 11/20 0700 In: 1201 [I.V.:548.9; NG/GT:591; IV Piggyback:61.1] Out: 200 [Urine:200]  Labs:  Recent Labs  09/04/14 0420 09/05/14 0451  WBC 14.5* 24.2*  HGB 11.5* 10.9*  PLT 469* 366  CREATININE 0.60 1.18*    Medical History: Past Medical History  Diagnosis Date  . 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 Hypothyroidism   . Obstructive sleep apnea 05/21/09    USES CPAP  . LIVER FUNCTION TESTS, ABNORMAL, HX OF 01/30/2009    Qualifier: Diagnosis of  By: Harrington Challenger, MD, [place] Flood   . PONV (postoperative nausea and vomiting)   . Hypertension   . Depression   . GERD (gastroesophageal reflux disease)   . Leiomyoma 2015    Multiple small on ultrasound, largest 24 mm  . Hodgkin disease     HISTORY OF  . Stomach cancer     Medications:  Scheduled:  . enoxaparin (LOVENOX) injection  40 mg Subcutaneous Q24H  . feeding supplement (OSMOLITE 1.2 CAL)  1,000 mL Per Tube Q24H  . free water  100 mL Per Tube 3 times per day  . levothyroxine  75 mcg Per J Tube QAC breakfast  . metoCLOPramide  10 mg Per Tube TID AC & HS  . ondansetron (ZOFRAN) IV  16 mg Intravenous 3 times per day  . pantoprazole sodium  40 mg Per Tube BID   Infusions:  . 0.9 % NaCl with KCl 20 mEq / L 75 mL/hr at 09/04/14 0034   Assessment: 59 yoF with recent diagnosis of gastric cancer, Hx Hodgkin Lymphoma was admitted 11/10 from oncologist's office with orthostatic hypotension and dehydration. Pt taken to OR on 11/11 for staging laparoscopy, possible  biopsy and J tube placement. Pt with persistent nausea after J-tube placement and trickle TFs. This improved and patient was slated for discharge soon. Patient now with new fever of unknown origin and leukocytosis. Pharmacy has been consulted to dose vancomycin and Zosyn for broad spectrum coverage.  Antiinfectives  11/11 >> cefoxitin x1 prior to surgery 11/20 >> vancomycin >> 11/20 >> Zosyn >>    Labs / vitals Tmax: 100.7 WBCs: 24.2 Renal: SCr 1.18 (was 0.6 yesterday, baseline 0.8), CrCl 54 ml/min CG/N  Microbiology 11/20 blood x2: IP 11/10 urine: >100k multiple bacterial morphotypes    Goal of Therapy:  Vancomycin trough level 15-20 mcg/ml Zosyn per indication and renal function  Plan:  - vancomycin 1500mg  IV x1 as a loading dose - vancomycin 750mg  IV q12h to start 12h afer loading dose - Zosyn 3.375G IV q8h, each dose to be infused over 4 hours - vancomycin trough at steady state if indicated - follow-up clinical course, culture results, renal function - follow-up antibiotic de-escalation and length of therapy  Thank you for the consult.  Currie Paris, PharmD, BCPS Pager: [phone] Pharmacy: [phone] 09/05/2014 11:02 AM

## 2014-09-05 NOTE — Progress Notes (Signed)
PT Cancellation Note  Patient Details Name: MACKENZY GRUMBINE MRN: [id number] DOB: [birth year]   Cancelled Treatment:    Reason Eval/Treat Not Completed: Fatigue/lethargy limiting ability to participate (pt stated she's tired right now and needs to rest, she plans to walk later today with her husband. Will follow. )   Philomena Doheny 09/05/2014, 1:10 PM  [phone]

## 2014-09-05 NOTE — Progress Notes (Signed)
Late entry for 1200:  Upon going to administer patient's synthroid tablet via the J-tube, patient and her spouse were adamant that they were told by the surgeon and PA not to administer any crushed tablets through J-tube.  Notified Will Berwick, [place] and he stated no pills through tube.  I notified Dr. Curly Rim and he then called Will.  Dr. [place] Pink called back and stated that pill could be crushed and then completely dissolved in water prior to administration.  Patient and her spouse were made aware of this and were okay with administration of the medication.  Zandra Abts [facility]  09/05/2014  6:31 PM

## 2014-09-05 NOTE — Progress Notes (Signed)
Pt refuses CPAP  RT to monitor and assess as needed.  

## 2014-09-05 NOTE — Progress Notes (Signed)
[facility] PROGRESS NOTE  Interim summary 63 y.o. female With history of recently diagnosed gastric cancer, hypothyroidism, gastroesophageal reflux disease, hypertension, depression, prior history of Hodgkin's disease who presented at oncologist's office for evaluation. Patient was noted to be orthostatic and noted to be tachycardic and dehydrated and a such patient was sent to the hospital as a direct admission for further evaluation and management. Course complicated with ileus after J tube placed.    Assessment/Plan:  Fever with Leukocytosis WBC has significantly risen today. She has developed low-grade fevers overnight. She does have a cough. We will get a chest x-ray. UA will be checked. Blood cultures will be obtained. We'll check pro-calcitonin level. However, she will need to be placed on antibiotics for now. We will initiate vancomycin and Zosyn per pharmacy.   Superficial thrombus in the left cephalic vein Possibly due to peripheral IV. Use warm compresses and keep the extremity elevated. This does not appear to be infected based on examination.  Nausea with vomiting: - Likely due to gastric cancer,  - General surgery service placed J-tube on 11.11.2015 to provide nutrition. -J tube evaluation done on 11/16; working as intended.  -Patient still with some intermittent nausea and some spitting. This is most likely due to her cancer. -She did have 2 loose stools. Change to Reglan via j tube and increase dose. This may help with her symptoms and perhaps improve motility. This could be helping some. -Will advance feeds to 45ml/hr.  -continue PRN zofran and phenergan; also with compazine prescription for refractory symptoms -Discussed use of J tube with general surgery. Can give liquids medications through it but no crushed medications.  -Patient cannot have anything by mouth due to her cancer.  Dehydration and hypokalemia  -Improved with IVF. However, creatinine is noted  to be slightly elevated this morning. We will increase the rate of fluids and repeat in the morning. Potassium repleted.   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 -CT chest showed sub-carina lymph node. -Patient to get chemotherapy as outpatient. Port A cath placement cancelled due to mild unspecified leukocytosis.  -placement time to be decided in outpatient settings  Severe protein caloric malnutrition: -J tube in place; not fully receiving appropriate rate for nutrition yet -patient intestinal activity is not fully restored. Reglan might help.   -plan is to advance rate to desire goal before discharge. Continue to increase rate of feeds as tolerated. -maintain elevated head  Orthostasis Hypotension: - Resolved. Blood pressure noted to be low this morning. Will be repeated. - Continue IVF's maintenance for now  Hypothyroidism - TSH 7.0.  - Synthroid to be given via feeding tube only if the tablet will completely dissolve in water. This has been communicated to the nurse.   Gastroesophageal reflux disease: -continue PPI;  Obstructive sleep apnea Continue CPAP at bedtime; patient has been refusing it  Depression: Overall Stable. -patient was not taking any meds at home -continue supportive care    Code Status: Full DVT Prophylaxis: Lovenox Family Communication: Discussed with husband and patient Disposition Plan: Not ready for discharge. She has had a setback today. I have discussed with the oncologist, Dr. Burr Medico and have requested her to speak to the patient and her family if possible.   Consultants:  Oncology  Surgery  IR  Procedures:  J tube placement  Antibiotics:  none  Subjective: She states that she felt better yesterday than the day before. Feels a little weak this morning. Has had  a cough with no expectoration. Nausea is somewhat better.   Objective: Filed Vitals:   09/04/14 2131  09/04/14 2215 09/05/14 0541 09/05/14 0550  BP: 127/55   97/56  Pulse: 125   125  Temp: 100.2 F (37.9 C) 99.9 F (37.7 C)  100.7 F (38.2 C)  TempSrc: Oral   Oral  Resp: 16   18  Height:      Weight:   85.004 kg (187 lb 6.4 oz)   SpO2: 92%   92%    Intake/Output Summary (Last 24 hours) at 09/05/14 0854 Last data filed at 09/05/14 0532  Gross per 24 hour  Intake   1201 ml  Output    200 ml  Net   1001 ml   Filed Weights   09/03/14 0501 09/04/14 0404 09/05/14 0541  Weight: 86.274 kg (190 lb 3.2 oz) 85.73 kg (189 lb) 85.004 kg (187 lb 6.4 oz)    Exam:  General: Alert, awake, oriented x3, in no acute distress. No Fever; denies CP or SOB Heart: Regular rate and rhythm; No rubs or gallops Lungs: Decreased air entry at the bases with a few bibasal crackles. No wheezing. Abdomen: Soft, J-tube in place; BS present Left arm is noted to be swollen compared to right. No erythema. No tenderness. Good radial pulses.   Data Reviewed: Basic Metabolic Panel:  Recent Labs Lab 08/30/14 0400 08/31/14 0423 08/31/14 0951 09/01/14 0430 09/02/14 0418 09/04/14 0420 09/05/14 0451  NA 138 139  --  142  --  136* 142  K 3.0* 3.2*  --  3.9  --  3.8 4.8  CL 98 100  --  105  --  101 104  CO2 27 27  --  27  --  23 25  GLUCOSE 122* 122*  --  115*  --  128* 133*  BUN 6 7  --  7  --  5* 8  CREATININE 0.61 0.67  --  0.67 0.69 0.60 1.18*  CALCIUM 8.4 8.3*  --  8.2*  --  8.4 8.1*  MG  --   --  1.5  --   --   --   --   PHOS  --  3.0  --   --   --   --   --    CBC:  Recent Labs Lab 09/01/14 0430 09/02/14 0418 09/04/14 0420 09/05/14 0451  WBC 13.2* 12.7* 14.5* 24.2*  HGB 11.1* 11.3* 11.5* 10.9*  HCT 33.5* 35.0* 34.7* 33.1*  MCV 99.1 101.4* 98.0 99.4  PLT 343 351 469* 366   CBG:  Recent Labs Lab 09/04/14 1208 09/04/14 1637 09/04/14 2013 09/05/14 0406 09/05/14 0827  GLUCAP 112* 103* 130* 142* 127*    Recent Results (from the past 240 hour(s))  Urine culture     Status: None     Collection Time: 08/26/14  6:54 PM  Result Value Ref Range Status   Specimen Description URINE, RANDOM  Final   Special Requests NONE  Final   Culture  Setup Time   Final    08/26/2014 22:23 Performed at [place]   Final    >=100,000 COLONIES/ML Performed at [facility]    Culture   Final    Multiple bacterial morphotypes present, none predominant. Suggest appropriate recollection if clinically indicated. Performed at [facility]    Report Status 08/27/2014 FINAL  Final     Studies: No results found.  Scheduled Meds: . enoxaparin (LOVENOX)  injection  40 mg Subcutaneous Q24H  . feeding supplement (OSMOLITE 1.2 CAL)  1,000 mL Per Tube Q24H  . free water  100 mL Per Tube 3 times per day  . levothyroxine  75 mcg Per J Tube QAC breakfast  . metoCLOPramide  10 mg Per Tube TID AC & HS  . ondansetron (ZOFRAN) IV  16 mg Intravenous 3 times per day  . pantoprazole sodium  40 mg Per Tube BID   Continuous Infusions: . 0.9 % NaCl with KCl 20 mEq / L 75 mL/hr at 09/04/14 0034   Time: 25 minutes  [place] [facility] Pager [phone].   If 7PM-7AM, please contact night-coverage at [email or web address], password [facility] 09/05/2014, 8:54 AM  LOS: 10 days

## 2014-09-05 NOTE — Progress Notes (Addendum)
NUTRITION FOLLOW UP  Intervention:   -Osmolite 1.2  At 30 ml/hr to increase to 40 ml/hr in am. MD to advance.  When ready to increase, recommend:  increase 10 ml every 12 hours to goal rate of 65 ml/hr per J-tube.   -Due to jejunal tube placement, pt would not benefit from bolus feeding schedule; will require cyclic continuous tube feeding - Free water 100 ml tid -IVF at 125 ml/hr -When stable for d/c; recommend:  -Home regimen: Osmolite 1.2 Goal via J-tube= rate of 120 ml/hr for 13 hours (7pm-8am) via Jtube to provide 1872 kcal (100% est kcal need), 87 gram protein (100% est protein needs), and 1279 ml free H2O.    (To reach goal, begin at 65 ml/hr and increase every 4 hours as tolerated, begin the next night at last tolerated rate and increase 10 ml every 4 hours as tolerated until goal of 120 ml/hr reached.) Will require additional 920 ml free water via flushes (approximately 230 ml (8 oz) four times daily)  Nutrition Dx:   Inadequate oral intake related to inability to eat as evidenced by NPO status; ongoing  Goal:   TF to meet >/= 90% of their estimated nutrition needs; progressing    Monitor:   TF tolerance, refeeding labs, total protein/energy intake, labs, weights, GI profile  Assessment:   -Pt's husband endorsed an unintentional weight loss of 40 lb in 2 months; however previous medical records indicate 20 lbs since 06/2014, and 40 lbs since 04/2014 (20% body weight loss in < 6 months, severe for time frame) -Diet recall indicates pt with minimal intake for past two months, largest meal in past 2 weeks with 1/2 grilled cheese sandwich and soup. Husband has been encouraging gatorade and fluids; however pt has been unable to tolerate any liquids or solids for past one week d/t nausea and emesis -Pt s/p Jtube placement -Surgery recommended trickle feeds for 24 hours. Will place recommendations for goal rate, and bolus feed recommendations to implement upon d/c. Nothing by mouth at this  time. -Family concerned with loose stools and skin care. Relayed to RN family concerns, and RD will follow up for additional home TF nutrition concerns.  -K low, Phos/Mg WNL. Monitor with initiation of EN and replete as needed -Admitted dehydrated; monitor fluid needs   11/13: -Pt tolerating Osmolite 1.2 at 15 ml/hr trickle feeds -Surgery requesting to continue with trickle feeds d/t concern for ileus -Pt will require continuous feeds vs bolus feeds upon d/c d/t Jtube placement. See Intervention section for d/c instructions -Continues to be at risk for refeeding, recommended refeeding labs be run when TF at 50% goal (approx 30 ml/hr) -RD provided pt and family with [employer] nutrition education booklet regarding home tube feeding instruction  11/15:   -Patient with several episodes of nausea and vomiting which she states looks like TF.  No flatus or BM.  TF held. Concern for ileus.  11/18: - Nurse reports patient complained of nausea when TF increased. - Patient had nausea prior to J-tube placement - Loose BM 11/17 and 11/18. - Concerns of ileus per MD  11/20: - Receiving Osmolite 1.2 at 30 ml/hr.  -Vomited a couple of times today.  Once after brushing teeth.  - No bowel movements today.  But has had 1 loose BM daily previously. - Patient receiving reglan - Husband present and discussed home TF plan.  Height: Ht Readings from Last 1 Encounters:  08/26/14 5\' 6"  (1.676 m)    Weight Status:  Wt Readings from Last 1 Encounters:  09/05/14 187 lb 6.4 oz (85.004 kg)    Re-estimated needs:  Kcal: 1750-1950 Protein: 85-95 gram Fluid: >/= 2200 ml daily  Skin: WDL  Diet Order:  NPO   Intake/Output Summary (Last 24 hours) at 09/05/14 1615 Last data filed at 09/05/14 1434  Gross per 24 hour  Intake   1201 ml  Output    200 ml  Net   1001 ml    Last BM: 11/04   Labs:   Recent Labs Lab 08/31/14 0423 08/31/14 0951 09/01/14 0430 09/02/14 0418 09/04/14 0420  09/05/14 0451  NA 139  --  142  --  136* 142  K 3.2*  --  3.9  --  3.8 4.8  CL 100  --  105  --  101 104  CO2 27  --  27  --  23 25  BUN 7  --  7  --  5* 8  CREATININE 0.67  --  0.67 0.69 0.60 1.18*  CALCIUM 8.3*  --  8.2*  --  8.4 8.1*  MG  --  1.5  --   --   --   --   PHOS 3.0  --   --   --   --   --   GLUCOSE 122*  --  115*  --  128* 133*    CBG (last 3)   Recent Labs  09/05/14 0406 09/05/14 0827 09/05/14 1243  GLUCAP 142* 127* 111*    Scheduled Meds: . enoxaparin (LOVENOX) injection  40 mg Subcutaneous Q24H  . [START ON 09/06/2014] feeding supplement (OSMOLITE 1.2 CAL)  1,000 mL Per Tube Q24H  . feeding supplement (OSMOLITE 1.2 CAL)  1,000 mL Per Tube Q24H  . free water  100 mL Per Tube 3 times per day  . levothyroxine  75 mcg Per J Tube QAC breakfast  . metoCLOPramide  10 mg Per Tube TID AC & HS  . neomycin-bacitracin-polymyxin   Topical BID  . ondansetron (ZOFRAN) IV  16 mg Intravenous 3 times per day  . pantoprazole sodium  40 mg Per Tube BID  . piperacillin-tazobactam (ZOSYN)  IV  3.375 g Intravenous Q8H  . [START ON 09/06/2014] vancomycin  750 mg Intravenous Q12H    Continuous Infusions: . 0.9 % NaCl with KCl 20 mEq / L 125 mL/hr at 09/05/14 4196    Antonieta Iba, RD, LDN Clinical Inpatient Dietitian Pager:  [phone] Weekend and after hours pager:  [phone]

## 2014-09-06 LAB — COMPREHENSIVE METABOLIC PANEL
ALT: 44 U/L — ABNORMAL HIGH (ref 0–35)
AST: 56 U/L — ABNORMAL HIGH (ref 0–37)
Albumin: 1.8 g/dL — ABNORMAL LOW (ref 3.5–5.2)
Alkaline Phosphatase: 130 U/L — ABNORMAL HIGH (ref 39–117)
Anion gap: 10 (ref 5–15)
BUN: 14 mg/dL (ref 6–23)
CALCIUM: 8.1 mg/dL — AB (ref 8.4–10.5)
CHLORIDE: 103 meq/L (ref 96–112)
CO2: 27 mEq/L (ref 19–32)
Creatinine, Ser: 0.92 mg/dL (ref 0.50–1.10)
GFR, EST AFRICAN AMERICAN: 76 mL/min — AB (ref >90)
GFR, EST NON AFRICAN AMERICAN: 65 mL/min — AB (ref >90)
GLUCOSE: 112 mg/dL — AB (ref 70–99)
Potassium: 4.4 mEq/L (ref 3.7–5.3)
Sodium: 140 mEq/L (ref 137–147)
Total Bilirubin: 0.3 mg/dL (ref 0.3–1.2)
Total Protein: 5.2 g/dL — ABNORMAL LOW (ref 6.0–8.3)

## 2014-09-06 LAB — GLUCOSE, CAPILLARY
GLUCOSE-CAPILLARY: 108 mg/dL — AB (ref 70–99)
GLUCOSE-CAPILLARY: 115 mg/dL — AB (ref 70–99)
GLUCOSE-CAPILLARY: 140 mg/dL — AB (ref 70–99)
Glucose-Capillary: 106 mg/dL — ABNORMAL HIGH (ref 70–99)
Glucose-Capillary: 133 mg/dL — ABNORMAL HIGH (ref 70–99)
Glucose-Capillary: 110 mg/dL — ABNORMAL HIGH (ref 70–99)

## 2014-09-06 LAB — CBC
HCT: 30.6 % — ABNORMAL LOW (ref 36.0–46.0)
HEMOGLOBIN: 9.8 g/dL — AB (ref 12.0–15.0)
MCH: 32.2 pg (ref 26.0–34.0)
MCHC: 32 g/dL (ref 30.0–36.0)
MCV: 100.7 fL — AB (ref 78.0–100.0)
Platelets: 414 10*3/uL — ABNORMAL HIGH (ref 150–400)
RBC: 3.04 MIL/uL — AB (ref 3.87–5.11)
RDW: 14.2 % (ref 11.5–15.5)
WBC: 18.5 10*3/uL — AB (ref 4.0–10.5)

## 2014-09-06 MED ORDER — SODIUM CHLORIDE 0.9 % IJ SOLN
10.0000 mL | INTRAMUSCULAR | Status: DC | PRN
  Administered 2014-09-09: 10 mL
  Filled 2014-09-06: qty 40

## 2014-09-06 MED ORDER — DIPHENHYDRAMINE HCL 50 MG/ML IJ SOLN
25.0000 mg | Freq: Once | INTRAMUSCULAR | Status: AC
  Administered 2014-09-06: 25 mg via INTRAVENOUS
  Filled 2014-09-06: qty 1

## 2014-09-06 MED ORDER — VANCOMYCIN HCL IN DEXTROSE 750-5 MG/150ML-% IV SOLN
750.0000 mg | Freq: Two times a day (BID) | INTRAVENOUS | Status: DC
  Filled 2014-09-06: qty 150

## 2014-09-06 MED ORDER — ALBUTEROL SULFATE (2.5 MG/3ML) 0.083% IN NEBU
2.5000 mg | INHALATION_SOLUTION | Freq: Once | RESPIRATORY_TRACT | Status: AC
  Administered 2014-09-06: 2.5 mg via RESPIRATORY_TRACT
  Filled 2014-09-06: qty 3

## 2014-09-06 MED ORDER — FAMOTIDINE IN NACL 20-0.9 MG/50ML-% IV SOLN
20.0000 mg | Freq: Two times a day (BID) | INTRAVENOUS | Status: DC
  Administered 2014-09-06 – 2014-09-08 (×6): 20 mg via INTRAVENOUS
  Filled 2014-09-06 (×8): qty 50

## 2014-09-06 MED ORDER — VANCOMYCIN HCL IN DEXTROSE 1-5 GM/200ML-% IV SOLN
1000.0000 mg | Freq: Two times a day (BID) | INTRAVENOUS | Status: DC
  Administered 2014-09-06 – 2014-09-08 (×4): 1000 mg via INTRAVENOUS
  Filled 2014-09-06 (×4): qty 200

## 2014-09-06 MED ORDER — SODIUM CHLORIDE 0.9 % IJ SOLN
10.0000 mL | Freq: Two times a day (BID) | INTRAMUSCULAR | Status: DC
  Administered 2014-09-06 – 2014-09-08 (×3): 10 mL

## 2014-09-06 MED ORDER — DIPHENHYDRAMINE HCL 50 MG/ML IJ SOLN
25.0000 mg | Freq: Four times a day (QID) | INTRAMUSCULAR | Status: DC | PRN
  Administered 2014-09-07 – 2014-09-08 (×3): 25 mg via INTRAVENOUS
  Filled 2014-09-06 (×3): qty 1

## 2014-09-06 NOTE — Progress Notes (Signed)
Late entry:  1125: Pt's face flushed and back of neck reddened.  Husband state's pt's face more red than yesterday.  MD called and notified since pt is receiving vanc.  MD ordered benedryl and pepcid prior to administration.  Pharmacy called, and RN staff to run vanc over 2-3 hours if pt is having a "rate reaction."  Pt/husband informed.  Current time: 2PM: Pt received pre-meds for vanc, flushing in face markedly improved.  Currently tolerating Vanc infusion via new PICC line

## 2014-09-06 NOTE — Progress Notes (Signed)
ANTIBIOTIC CONSULT NOTE - FOLLOW UP  Pharmacy Consult for Vancomycin and Zosyn Indication: FUO, leukocytosis  No Known Allergies  Patient Measurements: Height: 5\' 6"  (167.6 cm) Weight: 188 lb 1.6 oz (85.322 kg) IBW/kg (Calculated) : 59.3  Vital Signs: Temp: 98.1 F (36.7 C) (11/21 0426) Temp Source: Oral (11/21 0426) BP: 109/61 mmHg (11/21 0426) Pulse Rate: 100 (11/21 0426) Intake/Output from previous day: 11/20 0701 - 11/21 0700 In: 3863.6 [I.V.:1989.6; NG/GT:1146; IV Piggyback:728] Out: 300 [Urine:300] Intake/Output from this shift:    Labs:  Recent Labs  09/04/14 0420 09/05/14 0451 09/06/14 0515  WBC 14.5* 24.2* 18.5*  HGB 11.5* 10.9* 9.8*  PLT 469* 366 414*  CREATININE 0.60 1.18* 0.92   Estimated Creatinine Clearance: 69.8 mL/min (by C-G formula based on Cr of 0.92). No results for input(s): VANCOTROUGH, VANCOPEAK, VANCORANDOM, GENTTROUGH, GENTPEAK, GENTRANDOM, TOBRATROUGH, TOBRAPEAK, TOBRARND, AMIKACINPEAK, AMIKACINTROU, AMIKACIN in the last 72 hours.   Microbiology: Recent Results (from the past 720 hour(s))  Urine culture     Status: None   Collection Time: 08/26/14  6:54 PM  Result Value Ref Range Status   Specimen Description URINE, RANDOM  Final   Special Requests NONE  Final   Culture  Setup Time   Final    08/26/2014 22:23 Performed at [facility]   Final    >=100,000 COLONIES/ML Performed at [facility]    Culture   Final    Multiple bacterial morphotypes present, none predominant. Suggest appropriate recollection if clinically indicated. Performed at [facility]    Report Status 08/27/2014 FINAL  Final    Assessment: 52 yoF with recent diagnosis of gastric cancer, Hx Hodgkin Lymphoma was admitted 11/10 from oncologist's office with orthostatic hypotension and dehydration. Pt taken to OR on 11/11 for staging laparoscopy, possible biopsy and J tube placement. Pt with persistent nausea after J-tube  placement and trickle TFs. This improved and patient was slated for discharge soon. Patient now with new fever of unknown origin and leukocytosis. Pharmacy has been consulted to dose vancomycin and Zosyn for broad spectrum coverage.  11/11 >> cefoxitin x1 prior to surgery 11/20 >> vancomycin >> 11/20 >> Zosyn >>   Tmax: AF x 24h WBCs: 18.5 Renal: SCr 0.92 (brief increase 1.18 yday), CrCl 70 (CG and N)   11/10 urine: >100k multiple bacterial morphotypes  11/20 blood x2: sent 11/21 urine:  Drug level / dose changes info: 11/21 empiric increase vanc 1g q12h for improved SCr  This AM, RN reported worsening face and neck erythema. Concern for Red Man's Syndrome with vancomycin, however, drug was not infusing during these symptoms. Decision made to continue, but slow infusion extend infusion time to 2 hours. Also given benadryl and pepcid.  Goal of Therapy:  Vancomycin trough level 15-20 mcg/ml  Zosyn per indication and renal function  Plan:   Increase Vancomycin to 1g IV q12h since SCr improved Check trough at steady state Zosyn 3.375gm IV q8h (4hr extended infusions) Follow up renal function & cultures, clinical course  Peggyann Juba, PharmD, BCPS Pager: [phone] 09/06/2014,12:20 PM

## 2014-09-06 NOTE — Progress Notes (Signed)
Peripherally Inserted Central Catheter/Midline Placement  The IV Nurse has discussed with the patient and/or persons authorized to consent for the patient, the purpose of this procedure and the potential benefits and risks involved with this procedure.  The benefits include less needle sticks, lab draws from the catheter and patient may be discharged home with the catheter.  Risks include, but not limited to, infection, bleeding, blood clot (thrombus formation), and puncture of an artery; nerve damage and irregular heat beat.  Alternatives to this procedure were also discussed.  PICC/Midline Placement Documentation  PICC / Midline Single Lumen 09/06/14 PICC Right Brachial 35 cm 0 cm (Active)  Indication for Insertion or Continuance of Line Administration of hyperosmolar/irritating solutions (i.e. TPN, Vancomycin, etc.) 09/06/2014 11:10 AM  Exposed Catheter (cm) 0 cm 09/06/2014 11:10 AM  Site Assessment Clean;Dry;Intact 09/06/2014 11:10 AM  Line Status Flushed;Saline locked;Blood return noted 09/06/2014 11:10 AM  Dressing Type Transparent 09/06/2014 11:10 AM  Dressing Change Due 09/13/14 09/06/2014 11:10 AM       [facility] Payment 09/06/2014, 11:19 AM

## 2014-09-06 NOTE — Progress Notes (Addendum)
[facility] PROGRESS NOTE  Interim summary 63 y.o. female With history of recently diagnosed gastric cancer, hypothyroidism, gastroesophageal reflux disease, hypertension, depression, prior history of Hodgkin's disease who presented at oncologist's office for evaluation. Patient was noted to be orthostatic and noted to be tachycardic and dehydrated and a such patient was sent to the hospital as a direct admission for further evaluation and management. Course complicated with ileus after J tube placed. She subsequently developed fever and significantly elevated WBC. She was started on broad-spectrum antibiotic coverage.   Assessment/Plan:  Fever with Leukocytosis WBC has improved. Fevers. Appears to have subsided. Etiology remains unclear. It is quite possible she may be aspirating even though the chest x-ray did not show a clear infiltrate. Small pleural effusions were noted. Significance of which is unclear. UA noted to be abnormal but without clear evidence for infection. Follow up on urine cultures. Follow up also on blood cultures. Pro-calcitonin level noted to be elevated. Repeat in 48 hours.   Superficial thrombus in the left cephalic vein Possibly due to peripheral IV. Use warm compresses and keep the extremity elevated. This does not appear to be infected based on examination.  Nausea with vomiting: - Likely due to gastric cancer,  - General surgery service placed J-tube on 11.11.2015 to provide nutrition. -J tube evaluation done on 11/16; working as intended.  -Patient still with some intermittent nausea and some spitting. This is most likely due to her cancer. The symptoms are stable. -She did have 2 loose stools. Change to Reglan via j tube and increase dose. This may help with her symptoms and perhaps improve motility. This could be helping some. This is probably as good as she is going to get at this time. This has been communicated to the patient and her husband. -Will  advance feeds to 75ml/hr.  -continue PRN zofran and phenergan; also with compazine prescription for refractory symptoms -Discussed use of J tube with general surgery. Can give liquids medications through it but no crushed medications.  -Patient cannot have anything by mouth due to her cancer.  Dehydration and hypokalemia  -Improved with IVF. However, creatinine was noted to be slightly elevated on 11/20. Improved this morning. We'll cut back on IV fluids as she seems to be developing edema. Potassium repleted.   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 -CT chest showed sub-carina lymph node. -Patient to get chemotherapy as outpatient. Port A cath placement cancelled due to leukocytosis.  -placement time to be decided in outpatient settings  Severe protein caloric malnutrition: -J tube in place; not fully receiving appropriate rate for nutrition yet -patient intestinal activity appears to be improved. Continue Reglan.  -plan is to advance rate to desire goal before discharge. Continue to increase rate of feeds as tolerated. -maintain elevated head  Orthostasis Hypotension: - Resolved. Blood pressure stable. - Continue IVF's maintenance for now  Hypothyroidism - TSH 7.0.  - Synthroid to be given via feeding tube only if the tablet will completely dissolve in water. This has been communicated to the nurse.   Gastroesophageal reflux disease: -continue PPI;  Obstructive sleep apnea Continue CPAP at bedtime; patient has been refusing it  Depression: Overall Stable. -patient was not taking any meds at home -continue supportive care  ADDENDUM I was called by the RN stating that the patient's face and neck are looking more erythematous. Since the patient is on vancomycin there is a concern about "red man" syndrome. We will give her Benadryl  and Pepcid and possibly slow down the rate for vancomycin after consulting with  pharmacist. Continue to monitor for now.  Code Status: Full DVT Prophylaxis: Lovenox Family Communication: Discussed with husband and patient Disposition Plan: Not ready for discharge. Discussed with Dr. Burr Medico yesterday.   Consultants:  Oncology  Surgery  IR  Procedures:  J tube placement  PICC line on 11/21  Antibiotics:  Vancomycin and Zosyn initiated 11/20  Subjective: She feels some better. Feels as if she is developing swelling in the legs.  Objective: Filed Vitals:   09/05/14 0550 09/05/14 1432 09/05/14 2015 09/06/14 0426  BP: 97/56 104/61 99/60 109/61  Pulse: 125 98 99 100  Temp: 100.7 F (38.2 C) 98.5 F (36.9 C) 98 F (36.7 C) 98.1 F (36.7 C)  TempSrc: Oral Oral Oral Oral  Resp: 18 17 16 16   Height:      Weight:    85.322 kg (188 lb 1.6 oz)  SpO2: 92% 93% 95% 96%    Intake/Output Summary (Last 24 hours) at 09/06/14 0754 Last data filed at 09/06/14 0532  Gross per 24 hour  Intake 3363.6 ml  Output    300 ml  Net 3063.6 ml   Filed Weights   09/04/14 0404 09/05/14 0541 09/06/14 0426  Weight: 85.73 kg (189 lb) 85.004 kg (187 lb 6.4 oz) 85.322 kg (188 lb 1.6 oz)    Exam:  General: Alert, awake, oriented x3, in no acute distress. Face appears to be flushed this morning. Heart: Regular rate and rhythm; No rubs or gallops Lungs: Decreased air entry at the bases with a few bibasal crackles. Occasional wheezing wheezing. Abdomen: Soft, J-tube in place; BS present Left arm is noted to be swollen compared to right. No erythema. No tenderness. Good radial pulses.   Data Reviewed: Basic Metabolic Panel:  Recent Labs Lab 08/31/14 0423 08/31/14 0951 09/01/14 0430 09/02/14 0418 09/04/14 0420 09/05/14 0451 09/06/14 0515  NA 139  --  142  --  136* 142 140  K 3.2*  --  3.9  --  3.8 4.8 4.4  CL 100  --  105  --  101 104 103  CO2 27  --  27  --  23 25 27   GLUCOSE 122*  --  115*  --  128* 133* 112*  BUN 7  --  7  --  5* 8 14  CREATININE 0.67  --   0.67 0.69 0.60 1.18* 0.92  CALCIUM 8.3*  --  8.2*  --  8.4 8.1* 8.1*  MG  --  1.5  --   --   --   --   --   PHOS 3.0  --   --   --   --   --   --    CBC:  Recent Labs Lab 09/01/14 0430 09/02/14 0418 09/04/14 0420 09/05/14 0451 09/06/14 0515  WBC 13.2* 12.7* 14.5* 24.2* 18.5*  HGB 11.1* 11.3* 11.5* 10.9* 9.8*  HCT 33.5* 35.0* 34.7* 33.1* 30.6*  MCV 99.1 101.4* 98.0 99.4 100.7*  PLT 343 351 469* 366 414*   CBG:  Recent Labs Lab 09/05/14 1243 09/05/14 1646 09/05/14 2013 09/05/14 2343 09/06/14 0424  GLUCAP 111* 106* 123* 118* 115*    No results found for this or any previous visit (from the past 240 hour(s)).   Studies: Dg Chest 2 View  09/05/2014   CLINICAL DATA:  Cough. History of hypertension. Also history of sleep apnea and gastric carcinoma pleural effusions. Subsequent encounter.  EXAM: CHEST  2 VIEW  COMPARISON:  09/01/2014  FINDINGS: Small, left greater than right, pleural effusions. Associated lung base opacity, also greater on the left, is likely atelectasis.  Vascular congestion interstitial thickening is decreased when compared the prior exam.  No new areas of lung opacity. Stable left apical pleural parenchymal scarring.  Heart is normal in size.  No mediastinal or hilar masses.  IMPRESSION: 1. Improved lung aeration with decreased vascular congestion and resolved interstitial thickening consistent with resolved interstitial edema. 2. Persistent, small, left greater than right, pleural effusions with associated left greater than right lung base atelectasis.   Electronically Signed   By: Lajean Manes M.D.   On: 09/05/2014 11:33    Scheduled Meds: . enoxaparin (LOVENOX) injection  40 mg Subcutaneous Q24H  . feeding supplement (OSMOLITE 1.2 CAL)  1,000 mL Per Tube Q24H  . free water  100 mL Per Tube 3 times per day  . levothyroxine  75 mcg Per J Tube QAC breakfast  . metoCLOPramide  10 mg Per Tube TID AC & HS  . neomycin-bacitracin-polymyxin   Topical BID  .  ondansetron (ZOFRAN) IV  16 mg Intravenous 3 times per day  . pantoprazole sodium  40 mg Per Tube BID  . piperacillin-tazobactam (ZOSYN)  IV  3.375 g Intravenous Q8H  . vancomycin  750 mg Intravenous Q12H   Continuous Infusions: . 0.9 % NaCl with KCl 20 mEq / L 125 mL/hr at 09/06/14 0520   Time: 25 minutes  [facility] Pager [phone].   If 7PM-7AM, please contact night-coverage at [email or web address], password [facility] 09/06/2014, 7:54 AM  LOS: 11 days

## 2014-09-07 DIAGNOSIS — R062 Wheezing: Secondary | ICD-10-CM

## 2014-09-07 LAB — URINE CULTURE
Colony Count: NO GROWTH
Culture: NO GROWTH

## 2014-09-07 LAB — COMPREHENSIVE METABOLIC PANEL
ALK PHOS: 117 U/L (ref 39–117)
ALT: 30 U/L (ref 0–35)
AST: 23 U/L (ref 0–37)
Albumin: 1.8 g/dL — ABNORMAL LOW (ref 3.5–5.2)
Anion gap: 9 (ref 5–15)
BILIRUBIN TOTAL: 0.2 mg/dL — AB (ref 0.3–1.2)
BUN: 10 mg/dL (ref 6–23)
CHLORIDE: 99 meq/L (ref 96–112)
CO2: 28 meq/L (ref 19–32)
Calcium: 7.9 mg/dL — ABNORMAL LOW (ref 8.4–10.5)
Creatinine, Ser: 0.7 mg/dL (ref 0.50–1.10)
GFR calc non Af Amer: >90 mL/min (ref >90)
GLUCOSE: 152 mg/dL — AB (ref 70–99)
POTASSIUM: 3.7 meq/L (ref 3.7–5.3)
SODIUM: 136 meq/L — AB (ref 137–147)
TOTAL PROTEIN: 5.2 g/dL — AB (ref 6.0–8.3)

## 2014-09-07 LAB — CBC
HEMATOCRIT: 30.4 % — AB (ref 36.0–46.0)
Hemoglobin: 9.8 g/dL — ABNORMAL LOW (ref 12.0–15.0)
MCH: 32 pg (ref 26.0–34.0)
MCHC: 32.2 g/dL (ref 30.0–36.0)
MCV: 99.3 fL (ref 78.0–100.0)
Platelets: 435 10*3/uL — ABNORMAL HIGH (ref 150–400)
RBC: 3.06 MIL/uL — AB (ref 3.87–5.11)
RDW: 13.9 % (ref 11.5–15.5)
WBC: 14.6 10*3/uL — AB (ref 4.0–10.5)

## 2014-09-07 LAB — GLUCOSE, CAPILLARY
GLUCOSE-CAPILLARY: 124 mg/dL — AB (ref 70–99)
GLUCOSE-CAPILLARY: 166 mg/dL — AB (ref 70–99)
GLUCOSE-CAPILLARY: 175 mg/dL — AB (ref 70–99)
Glucose-Capillary: 118 mg/dL — ABNORMAL HIGH (ref 70–99)
Glucose-Capillary: 151 mg/dL — ABNORMAL HIGH (ref 70–99)
Glucose-Capillary: 158 mg/dL — ABNORMAL HIGH (ref 70–99)

## 2014-09-07 LAB — PROCALCITONIN: PROCALCITONIN: 2.4 ng/mL

## 2014-09-07 MED ORDER — METHYLPREDNISOLONE SODIUM SUCC 40 MG IJ SOLR
40.0000 mg | Freq: Two times a day (BID) | INTRAMUSCULAR | Status: DC
  Administered 2014-09-07 (×2): 40 mg via INTRAVENOUS
  Filled 2014-09-07 (×4): qty 1

## 2014-09-07 MED ORDER — OSMOLITE 1.2 CAL PO LIQD
1000.0000 mL | ORAL | Status: DC
  Administered 2014-09-08 (×2): 1000 mL

## 2014-09-07 MED ORDER — INSULIN ASPART 100 UNIT/ML ~~LOC~~ SOLN
0.0000 [IU] | SUBCUTANEOUS | Status: DC
  Administered 2014-09-08: 3 [IU] via SUBCUTANEOUS
  Administered 2014-09-08 (×2): 2 [IU] via SUBCUTANEOUS
  Administered 2014-09-08: 1 [IU] via SUBCUTANEOUS
  Administered 2014-09-08 – 2014-09-09 (×4): 2 [IU] via SUBCUTANEOUS

## 2014-09-07 NOTE — Plan of Care (Signed)
Problem: Phase I Progression Outcomes Goal: Hemodynamically stable Outcome: Progressing  Problem: Phase II Progression Outcomes Goal: Discharge plan established Outcome: Completed/Met Date Met:  09/07/14

## 2014-09-07 NOTE — Progress Notes (Signed)
[facility] PROGRESS NOTE  Interim summary 63 y.o. female with history of recently diagnosed gastric cancer, hypothyroidism, gastroesophageal reflux disease, hypertension, depression, prior history of Hodgkin's disease who presented at oncologist's office for evaluation. Patient was noted to be orthostatic and noted to be tachycardic and dehydrated and a such patient was sent to the hospital as a direct admission for further evaluation and management. Course complicated with ileus after J tube placed. She subsequently developed fever and significantly elevated WBC. She was started on broad-spectrum antibiotic coverage.   Assessment/Plan:  Fever with Leukocytosis WBC has improved. Fever appears to have subsided. Etiology remains unclear. It is quite possible she may be aspirating even though the chest x-ray did not show a clear infiltrate. Small pleural effusions were noted significance of which is unclear. UA noted to be abnormal but without clear evidence for infection. Follow up on urine cultures. Follow up also on blood cultures. Pro-calcitonin level noted to be elevated and is better today.   Wheezing She has more wheezing today. Chest x-ray was done recently and did not reveal any concerning findings. I have discussed use of steroids as it may be beneficial and she is agreeable. She has a history of smoking in the past.  Facial Erythema Thought to be related to vancomycin. But it cleared up after Benadryl and Pepcid. Continue to monitor for now.  Superficial thrombus in the left cephalic vein Possibly due to peripheral IV. Use warm compresses and keep the extremity elevated. This does not appear to be infected based on examination.  Nausea with vomiting: She seems to be improving. - Likely due to gastric cancer,  - General surgery service placed J-tube on 11.11.2015 to provide nutrition. -J tube evaluation done on 11/16; working as intended.  -Patient still with some  intermittent nausea and some spitting but overall better.  -She did have 2 loose stools. Continue Reglan through the J-tube. This appears to be helping.  -Will advance feeds to 31ml/hr.  -continue PRN zofran and phenergan; also with compazine prescription for refractory symptoms -Discussed use of J tube with general surgery. Can give liquids medications through it but no crushed medications.  -Patient cannot have anything by mouth due to her cancer.  Dehydration and hypokalemia  -Improved with IVF. Continues to be stable.   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 -CT chest showed sub-carina lymph node. -Patient to get chemotherapy as outpatient. Port A cath placement cancelled due to leukocytosis and fever.  -placement time to be decided in outpatient settings  Severe protein caloric malnutrition: -J tube in place; not fully receiving appropriate rate for nutrition yet -patient intestinal activity appears to be improved. Continue Reglan.  -plan is to advance rate to desire goal before discharge. Continue to increase rate of feeds as tolerated. -maintain elevated head  Orthostasis Hypotension: - Resolved. Blood pressure stable.  Hypothyroidism - TSH 7.0.  - Synthroid to be given via feeding tube only if the tablet will completely dissolve in water. This has been communicated to the nurse.   Gastroesophageal reflux disease: -continue PPI;  Obstructive sleep apnea Continue CPAP at bedtime; patient has been refusing it  Depression: Overall Stable. -patient was not taking any meds at home -continue supportive care  Code Status: Full DVT Prophylaxis: Lovenox Family Communication: Discussed with husband and patient Disposition Plan: Patient is slowly improving. If cultures remain negative, we will switch her to oral antibiotics tomorrow. I'm hoping that she may be able to go  home by  Tuesday.   Consultants:  Oncology  Surgery  IR  Procedures:  J tube placement  PICC line on 11/21  Antibiotics:  Vancomycin and Zosyn initiated 11/20  Subjective: She feels better. Nausea is much better. Denies new complaints.   Objective: Filed Vitals:   09/06/14 1136 09/06/14 1259 09/06/14 2002 09/07/14 0436  BP:  134/73 152/85 124/79  Pulse:  116 123 108  Temp:  98.8 F (37.1 C) 98.1 F (36.7 C) 98 F (36.7 C)  TempSrc:  Oral Oral Oral  Resp:  16 16 16   Height:      Weight:    86.728 kg (191 lb 3.2 oz)  SpO2: 94% 97% 97% 96%    Intake/Output Summary (Last 24 hours) at 09/07/14 0833 Last data filed at 09/07/14 0814  Gross per 24 hour  Intake    843 ml  Output    925 ml  Net    -82 ml   Filed Weights   09/05/14 0541 09/06/14 0426 09/07/14 0436  Weight: 85.004 kg (187 lb 6.4 oz) 85.322 kg (188 lb 1.6 oz) 86.728 kg (191 lb 3.2 oz)    Exam:  General: Alert, awake, oriented x3, in no acute distress. Facial erythema is much improved today. Heart: Regular rate and rhythm; No rubs or gallops Lungs: Decreased air entry at the bases with a few bibasal crackles. More Wheezing is appreciated today than yesterday. Abdomen: Soft, J-tube in place; BS present Left arm is noted to be swollen compared to right. No erythema. No tenderness. Good radial pulses.   Data Reviewed: Basic Metabolic Panel:  Recent Labs Lab 08/31/14 0951  09/01/14 0430 09/02/14 0418 09/04/14 0420 09/05/14 0451 09/06/14 0515 09/07/14 0424  NA  --   --  142  --  136* 142 140 136*  K  --   --  3.9  --  3.8 4.8 4.4 3.7  CL  --   --  105  --  101 104 103 99  CO2  --   --  27  --  23 25 27 28   GLUCOSE  --   --  115*  --  128* 133* 112* 152*  BUN  --   --  7  --  5* 8 14 10   CREATININE  --   < > 0.67 0.69 0.60 1.18* 0.92 0.70  CALCIUM  --   --  8.2*  --  8.4 8.1* 8.1* 7.9*  MG 1.5  --   --   --   --   --   --   --   < > = values in this interval not displayed. CBC:  Recent  Labs Lab 09/02/14 0418 09/04/14 0420 09/05/14 0451 09/06/14 0515 09/07/14 0424  WBC 12.7* 14.5* 24.2* 18.5* 14.6*  HGB 11.3* 11.5* 10.9* 9.8* 9.8*  HCT 35.0* 34.7* 33.1* 30.6* 30.4*  MCV 101.4* 98.0 99.4 100.7* 99.3  PLT 351 469* 366 414* 435*   CBG:  Recent Labs Lab 09/06/14 1230 09/06/14 1656 09/06/14 1955 09/06/14 2349 09/07/14 0405  GLUCAP 106* 133* 110* 140* 151*    Recent Results (from the past 240 hour(s))  Culture, blood (routine x 2)     Status: None (Preliminary result)   Collection Time: 09/05/14  9:38 AM  Result Value Ref Range Status   Specimen Description BLOOD RIGHT ARM  Final   Special Requests BOTTLES DRAWN AEROBIC AND ANAEROBIC 10CC  Final   Culture  Setup Time   Final  09/05/2014 13:20 Performed at [facility]    Culture   Final           BLOOD CULTURE RECEIVED NO GROWTH TO DATE CULTURE WILL BE HELD FOR 5 DAYS BEFORE ISSUING A FINAL NEGATIVE REPORT Performed at [facility]    Report Status PENDING  Incomplete  Culture, blood (routine x 2)     Status: None (Preliminary result)   Collection Time: 09/05/14  9:45 AM  Result Value Ref Range Status   Specimen Description BLOOD RIGHT HAND  Final   Special Requests BOTTLES DRAWN AEROBIC AND ANAEROBIC 5CC  Final   Culture  Setup Time   Final    09/05/2014 13:20 Performed at [facility]    Culture   Final           BLOOD CULTURE RECEIVED NO GROWTH TO DATE CULTURE WILL BE HELD FOR 5 DAYS BEFORE ISSUING A FINAL NEGATIVE REPORT Performed at [facility]    Report Status PENDING  Incomplete     Studies: Dg Chest 2 View  09/05/2014   CLINICAL DATA:  Cough. History of hypertension. Also history of sleep apnea and gastric carcinoma pleural effusions. Subsequent encounter.  EXAM: CHEST  2 VIEW  COMPARISON:  09/01/2014  FINDINGS: Small, left greater than right, pleural effusions. Associated lung base opacity, also greater on the left, is likely atelectasis.  Vascular  congestion interstitial thickening is decreased when compared the prior exam.  No new areas of lung opacity. Stable left apical pleural parenchymal scarring.  Heart is normal in size.  No mediastinal or hilar masses.  IMPRESSION: 1. Improved lung aeration with decreased vascular congestion and resolved interstitial thickening consistent with resolved interstitial edema. 2. Persistent, small, left greater than right, pleural effusions with associated left greater than right lung base atelectasis.   Electronically Signed   By: Lajean Manes M.D.   On: 09/05/2014 11:33    Scheduled Meds: . enoxaparin (LOVENOX) injection  40 mg Subcutaneous Q24H  . famotidine (PEPCID) IV  20 mg Intravenous Q12H  . feeding supplement (OSMOLITE 1.2 CAL)  1,000 mL Per Tube Q24H  . free water  100 mL Per Tube 3 times per day  . levothyroxine  75 mcg Per J Tube QAC breakfast  . methylPREDNISolone (SOLU-MEDROL) injection  40 mg Intravenous Q12H  . metoCLOPramide  10 mg Per Tube TID AC & HS  . neomycin-bacitracin-polymyxin   Topical BID  . ondansetron (ZOFRAN) IV  16 mg Intravenous 3 times per day  . pantoprazole sodium  40 mg Per Tube BID  . piperacillin-tazobactam (ZOSYN)  IV  3.375 g Intravenous Q8H  . sodium chloride  10-40 mL Intracatheter Q12H  . vancomycin  1,000 mg Intravenous Q12H   Continuous Infusions:   Time: 25 minutes  [facility] Pager [phone].   If 7PM-7AM, please contact night-coverage at [email or web address], password [facility] 09/07/2014, 8:33 AM  LOS: 12 days

## 2014-09-08 ENCOUNTER — Other Ambulatory Visit: Payer: BC Managed Care – PPO

## 2014-09-08 ENCOUNTER — Ambulatory Visit: Payer: BC Managed Care – PPO | Admitting: Hematology

## 2014-09-08 ENCOUNTER — Encounter: Payer: Self-pay | Admitting: Family Medicine

## 2014-09-08 LAB — GLUCOSE, CAPILLARY
GLUCOSE-CAPILLARY: 197 mg/dL — AB (ref 70–99)
GLUCOSE-CAPILLARY: 162 mg/dL — AB (ref 70–99)
GLUCOSE-CAPILLARY: 147 mg/dL — AB (ref 70–99)
Glucose-Capillary: 169 mg/dL — ABNORMAL HIGH (ref 70–99)
Glucose-Capillary: 170 mg/dL — ABNORMAL HIGH (ref 70–99)

## 2014-09-08 LAB — CBC
HCT: 31.8 % — ABNORMAL LOW (ref 36.0–46.0)
Hemoglobin: 10.4 g/dL — ABNORMAL LOW (ref 12.0–15.0)
MCH: 32.3 pg (ref 26.0–34.0)
MCHC: 32.7 g/dL (ref 30.0–36.0)
MCV: 98.8 fL (ref 78.0–100.0)
PLATELETS: 489 10*3/uL — AB (ref 150–400)
RBC: 3.22 MIL/uL — ABNORMAL LOW (ref 3.87–5.11)
RDW: 13.6 % (ref 11.5–15.5)
WBC: 15.2 10*3/uL — AB (ref 4.0–10.5)

## 2014-09-08 LAB — COMPREHENSIVE METABOLIC PANEL
ALT: 25 U/L (ref 0–35)
AST: 18 U/L (ref 0–37)
Albumin: 2.1 g/dL — ABNORMAL LOW (ref 3.5–5.2)
Alkaline Phosphatase: 130 U/L — ABNORMAL HIGH (ref 39–117)
Anion gap: 9 (ref 5–15)
BUN: 8 mg/dL (ref 6–23)
CALCIUM: 8.6 mg/dL (ref 8.4–10.5)
CO2: 30 mEq/L (ref 19–32)
CREATININE: 0.69 mg/dL (ref 0.50–1.10)
Chloride: 97 mEq/L (ref 96–112)
GFR calc Af Amer: >90 mL/min (ref >90)
GFR calc non Af Amer: >90 mL/min (ref >90)
Glucose, Bld: 207 mg/dL — ABNORMAL HIGH (ref 70–99)
Potassium: 4 mEq/L (ref 3.7–5.3)
SODIUM: 136 meq/L — AB (ref 137–147)
Total Bilirubin: 0.3 mg/dL (ref 0.3–1.2)
Total Protein: 5.9 g/dL — ABNORMAL LOW (ref 6.0–8.3)

## 2014-09-08 MED ORDER — AMOXICILLIN-POT CLAVULANATE 400-57 MG/5ML PO SUSR
800.0000 mg | Freq: Two times a day (BID) | ORAL | Status: DC
  Administered 2014-09-08 (×2): 800 mg
  Filled 2014-09-08 (×4): qty 10

## 2014-09-08 MED ORDER — PREDNISONE 5 MG/5ML PO SOLN
60.0000 mg | Freq: Every day | ORAL | Status: DC
  Filled 2014-09-08 (×2): qty 60

## 2014-09-08 MED ORDER — PREDNISONE 50 MG PO TABS
60.0000 mg | ORAL_TABLET | Freq: Every day | ORAL | Status: DC
  Administered 2014-09-08: 60 mg
  Filled 2014-09-08 (×3): qty 1

## 2014-09-08 MED ORDER — PREDNISONE 5 MG/5ML PO SOLN: 60.0000 mg | Freq: Every day | ORAL | Status: DC

## 2014-09-08 NOTE — Plan of Care (Signed)
Problem: Phase III Progression Outcomes Goal: Pain controlled on oral analgesia Outcome: Completed/Met Date Met:  09/08/14     

## 2014-09-08 NOTE — Progress Notes (Signed)
Physical Therapy Treatment Patient Details Name: Sabrina Melton MRN: [id number] DOB: [birth year] Today's Date: 09/08/2014    History of Present Illness 63 yo female s/p exp lap, j tube placement 08/27/14.    PT Comments    Pt was able to tolerate 100 feet of gait training in the hallway on RA; O2 sats dropped to 89% HR increased to 127, pt was asymptomatic.  Pt is independent-min guard with all mobility.  Pt does not require use of AD or other services post d/c.  Requires increased time for equipment.    Follow Up Recommendations  No PT follow up     Equipment Recommendations  None recommended by PT    Recommendations for Other Services       Precautions / Restrictions Precautions Precautions: Fall Restrictions Weight Bearing Restrictions: No    Mobility  Bed Mobility Overal bed mobility: Independent Bed Mobility: Supine to Sit     Supine to sit: Independent     General bed mobility comments: HOB elevated  Transfers Overall transfer level: Independent Equipment used: None Transfers: Sit to/from Stand Sit to Stand: Independent         General transfer comment: pt performs without cueing  Ambulation/Gait Ambulation/Gait assistance: Min guard Ambulation Distance (Feet): 100 Feet Assistive device: None Gait Pattern/deviations: Step-through pattern;Decreased stride length Gait velocity: decr   General Gait Details: pt requires increased time during gait; pt is safe and steady; O2 sats dropped to 89% during gait, pt was asymptomatic; HR 127 bpm; nursing aware   Stairs            Wheelchair Mobility    Modified Rankin (Stroke Patients Only)       Balance                                    Cognition                            Exercises      General Comments        Pertinent Vitals/Pain      Home Living                      Prior Function            PT Goals (current goals can now be found  in the care plan section) Progress towards PT goals: Progressing toward goals    Frequency  Min 3X/week    PT Plan Current plan remains appropriate    Co-evaluation             End of Session Equipment Utilized During Treatment: Gait belt Activity Tolerance: Patient tolerated treatment well Patient left: in chair;with call bell/phone within reach     Time: 1153-1219 PT Time Calculation (min) (ACUTE ONLY): 26 min  Charges:                       G Codes:      Miller,Derrick, SPTA 09/08/2014, 12:31 PM   Reviewed above  Rica Koyanagi  PTA [facility]  Acute  Rehab Pager      [phone]

## 2014-09-08 NOTE — Progress Notes (Addendum)
NUTRITION FOLLOW UP  Intervention:   -Osmolite 1.2  At 50 ml/hr to increase to 60 ml/hr in am. MD to advance.  Goal rate:  65 ml/hr per J-tube.   -Due to jejunal tube placement, pt would not benefit from bolus feeding schedule; will require cyclic continuous tube feeding - Free water 100 ml tid -Additional flushes with meds -IVF KVO -When stable for d/c; recommend:  -Home regimen:  Osmolite 1.2 via J-tube.  Goal:  65 ml x 24/day or 75 ml for 20 hours per day.  Patient can increase rate 10 ml per 24 hours until goal reached.  Optimum goal of 120 ml for 13 hours per night.  Plus Free water 120 ml before TF started and after TF ends for the day plus 240 ml tid which can be given with meds and flushes.  Instructed patient and family on above.  Word document provided with above information.  Patient to follow up as needed with the [place] RD.  Nutrition Dx:   Inadequate oral intake related to inability to eat as evidenced by NPO status; ongoing  Goal:   TF to meet >/= 90% of their estimated nutrition needs; progressing    Monitor:   TF tolerance, refeeding labs, total protein/energy intake, labs, weights, GI profile  Assessment:   -Pt's husband endorsed an unintentional weight loss of 40 lb in 2 months; however previous medical records indicate 20 lbs since 06/2014, and 40 lbs since 04/2014 (20% body weight loss in < 6 months, severe for time frame) -Diet recall indicates pt with minimal intake for past two months, largest meal in past 2 weeks with 1/2 grilled cheese sandwich and soup. Husband has been encouraging gatorade and fluids; however pt has been unable to tolerate any liquids or solids for past one week d/t nausea and emesis -Pt s/p Jtube placement -Surgery recommended trickle feeds for 24 hours. Will place recommendations for goal rate, and bolus feed recommendations to implement upon d/c. Nothing by mouth at this time. -Family concerned with loose stools and skin  care. Relayed to RN family concerns, and RD will follow up for additional home TF nutrition concerns.  -K low, Phos/Mg WNL. Monitor with initiation of EN and replete as needed -Admitted dehydrated; monitor fluid needs   11/13: -Pt tolerating Osmolite 1.2 at 15 ml/hr trickle feeds -Surgery requesting to continue with trickle feeds d/t concern for ileus -Pt will require continuous feeds vs bolus feeds upon d/c d/t Jtube placement. See Intervention section for d/c instructions -Continues to be at risk for refeeding, recommended refeeding labs be run when TF at 50% goal (approx 30 ml/hr) -RD provided pt and family with [employer] nutrition education booklet regarding home tube feeding instruction  11/15:   -Patient with several episodes of nausea and vomiting which she states looks like TF.  No flatus or BM.  TF held. Concern for ileus.  11/18: - Nurse reports patient complained of nausea when TF increased. - Patient had nausea prior to J-tube placement - Loose BM 11/17 and 11/18. - Concerns of ileus per MD  11/20: - Receiving Osmolite 1.2 at 30 ml/hr.  -Vomited a couple of times today.  Once after brushing teeth.  - No bowel movements today.  But has had 1 loose BM daily previously. - Patient receiving reglan - Husband present and discussed home TF plan.  11/23: -Some reflux after TF rate increased this am.  Question if this was secretions. -Tolerating TF which will be increased again prior  to discharge. -RN has begun TF teaching with patient and husband. -1 BM daily loose.  Height: Ht Readings from Last 1 Encounters:  08/26/14 5\' 6"  (1.676 m)    Weight Status:   Wt Readings from Last 1 Encounters:  09/08/14 192 lb 9.6 oz (87.363 kg)    Re-estimated needs:  Kcal: 1750-1950 Protein: 85-95 gram Fluid: >/= 2200 ml daily  Skin: WDL  Diet Order:  NPO   Intake/Output Summary (Last 24 hours) at 09/08/14 1600 Last data filed at 09/08/14 0905  Gross per 24 hour  Intake    190  ml  Output   1500 ml  Net  -1310 ml    Last BM: 11/04   Labs:   Recent Labs Lab 09/06/14 0515 09/07/14 0424 09/08/14 0410  NA 140 136* 136*  K 4.4 3.7 4.0  CL 103 99 97  CO2 27 28 30   BUN 14 10 8   CREATININE 0.92 0.70 0.69  CALCIUM 8.1* 7.9* 8.6  GLUCOSE 112* 152* 207*    CBG (last 3)   Recent Labs  09/08/14 0359 09/08/14 0729 09/08/14 1225  GLUCAP 197* 162* 169*    Scheduled Meds: . amoxicillin-clavulanate  800 mg Per Tube Q12H  . enoxaparin (LOVENOX) injection  40 mg Subcutaneous Q24H  . famotidine (PEPCID) IV  20 mg Intravenous Q12H  . feeding supplement (OSMOLITE 1.2 CAL)  1,000 mL Per Tube Q24H  . free water  100 mL Per Tube 3 times per day  . insulin aspart  0-9 Units Subcutaneous 6 times per day  . levothyroxine  75 mcg Per J Tube QAC breakfast  . metoCLOPramide  10 mg Per Tube TID AC & HS  . neomycin-bacitracin-polymyxin   Topical BID  . ondansetron (ZOFRAN) IV  16 mg Intravenous 3 times per day  . pantoprazole sodium  40 mg Per Tube BID  . predniSONE  60 mg Per Tube Q breakfast  . sodium chloride  10-40 mL Intracatheter Q12H    Continuous Infusions:    Antonieta Iba, RD, LDN Clinical Inpatient Dietitian Pager:  [phone] Weekend and after hours pager:  [phone]

## 2014-09-08 NOTE — Progress Notes (Deleted)
Continues with loose green stools. Some bright red blood present when patient wiped after stool this afternoon.Harlene Ramus

## 2014-09-08 NOTE — Progress Notes (Signed)
[facility] PROGRESS NOTE  Interim summary 63 y.o. female with history of recently diagnosed gastric cancer, hypothyroidism, gastroesophageal reflux disease, hypertension, depression, prior history of Hodgkin's disease who presented at oncologist's office for evaluation. Patient was noted to be orthostatic and noted to be tachycardic and dehydrated and a such patient was sent to the hospital as a direct admission for further evaluation and management. Course complicated with ileus after J tube placed. She subsequently developed fever and significantly elevated WBC. She was started on broad-spectrum antibiotic coverage.   Assessment/Plan:  Fever with Leukocytosis No further fever. WBC had improved, but is now higher due to steroids. Etiology however remains unclear. It is quite possible she may be aspirating even though the chest x-ray did not show a clear infiltrate. Small pleural effusions were noted significance of which is unclear. UA noted to be abnormal but without clear evidence for infection. Blood and urine cultures are negative. Pro-calcitonin level noted to be elevated and has improved. At this point, we will change her over to Augmentin.  Wheezing No further wheezing. Continue with steroids, but change it to a liquid formulation to be given via feeding tube. Chest x-ray was done recently and did not reveal any concerning findings. She has a history of smoking in the past.  Facial Erythema Thought to be related to vancomycin. But it cleared up after Benadryl and Pepcid. Slightly more erythematous this morning. Continue to monitor for now.  Superficial thrombus in the left cephalic vein Possibly due to peripheral IV. Use warm compresses and keep the extremity elevated. This does not appear to be infected based on examination.  Nausea with vomiting: She seems to be improving. - Likely due to gastric cancer,  - General surgery service placed J-tube on 11.11.2015 to provide  nutrition. -J tube evaluation done on 11/16; working as intended.  -Patient still with some intermittent nausea and some spitting but overall better.  -She did have 2 loose stools. Continue Reglan through the J-tube. This appears to be helping.  -Continue to increase tube feeding rate  -continue PRN zofran and phenergan; also with compazine prescription for refractory symptoms -Discussed use of J tube with general surgery. Can give liquids medications through it but no crushed medications.  -Patient cannot have anything by mouth due to her cancer.  Dehydration and hypokalemia  -Improved with IVF. Continues to be stable.   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 -CT chest showed sub-carina lymph node. -Patient to get chemotherapy as outpatient. Port A cath placement cancelled due to leukocytosis and fever.  -placement time to be decided in outpatient settings.  Severe protein caloric malnutrition: -J tube in place; not fully receiving appropriate rate for nutrition yet -patient intestinal activity appears to be improved. Continue Reglan.  -plan is to advance rate to desire goal before discharge. Continue to increase rate of feeds as tolerated. -maintain elevated head. She does have edema in her extremities, which is most likely due to hypoalbuminemia.  Orthostasis Hypotension: - Resolved. Blood pressure stable.  Hypothyroidism - TSH 7.0.  - Synthroid to be given via feeding tube only if the tablet will completely dissolve in water. This has been communicated to the nurse.   Gastroesophageal reflux disease: -continue PPI;  Obstructive sleep apnea Continue CPAP at bedtime; patient has been refusing it  Depression: Overall Stable. -patient was not taking any meds at home -continue supportive care  Code Status: Full DVT Prophylaxis: Lovenox Family Communication: Discussed with husband and  patient Disposition Plan:  Await PT evaluation today. If she continues to be stable with the medication changes as outlined above, I anticipate her going home tomorrow.  Consultants:  Oncology  Surgery  IR  Procedures:  J tube placement  PICC line on 11/21  Antibiotics:  Vancomycin and Zosyn initiated 11/20-->11/23  Augmentin 11/23-->  Subjective: Patient feels that she is breathing better. Nausea remains as before.   Objective: Filed Vitals:   09/07/14 0436 09/07/14 1500 09/07/14 2110 09/08/14 0504  BP: 124/79 140/82 124/72 133/80  Pulse: 108 111 110 113  Temp: 98 F (36.7 C) 98 F (36.7 C) 97.8 F (36.6 C) 97.7 F (36.5 C)  TempSrc: Oral Oral Oral Oral  Resp: 16 16 16 16   Height:      Weight: 86.728 kg (191 lb 3.2 oz)   87.363 kg (192 lb 9.6 oz)  SpO2: 96% 95% 96% 95%    Intake/Output Summary (Last 24 hours) at 09/08/14 0904 Last data filed at 09/08/14 0032  Gross per 24 hour  Intake   1008 ml  Output   1800 ml  Net   -792 ml   Filed Weights   09/06/14 0426 09/07/14 0436 09/08/14 0504  Weight: 85.322 kg (188 lb 1.6 oz) 86.728 kg (191 lb 3.2 oz) 87.363 kg (192 lb 9.6 oz)    Exam:  General: Alert, awake, oriented x3, in no acute distress. Facial erythema is much improved today. Heart: Regular rate and rhythm; No rubs or gallops Lungs: Improved air entry today. No wheezing.  Abdomen: Soft, J-tube in place; BS present Left arm is noted to be swollen compared to right, but has improved. No erythema. No tenderness. Good radial pulses.   Data Reviewed: Basic Metabolic Panel:  Recent Labs Lab 09/04/14 0420 09/05/14 0451 09/06/14 0515 09/07/14 0424 09/08/14 0410  NA 136* 142 140 136* 136*  K 3.8 4.8 4.4 3.7 4.0  CL 101 104 103 99 97  CO2 23 25 27 28 30   GLUCOSE 128* 133* 112* 152* 207*  BUN 5* 8 14 10 8   CREATININE 0.60 1.18* 0.92 0.70 0.69  CALCIUM 8.4 8.1* 8.1* 7.9* 8.6   CBC:  Recent Labs Lab 09/04/14 0420 09/05/14 0451 09/06/14 0515 09/07/14 0424  09/08/14 0410  WBC 14.5* 24.2* 18.5* 14.6* 15.2*  HGB 11.5* 10.9* 9.8* 9.8* 10.4*  HCT 34.7* 33.1* 30.6* 30.4* 31.8*  MCV 98.0 99.4 100.7* 99.3 98.8  PLT 469* 366 414* 435* 489*   CBG:  Recent Labs Lab 09/07/14 1633 09/07/14 2002 09/07/14 2355 09/08/14 0359 09/08/14 0729  GLUCAP 166* 175* 158* 197* 162*    Recent Results (from the past 240 hour(s))  Culture, blood (routine x 2)     Status: None (Preliminary result)   Collection Time: 09/05/14  9:38 AM  Result Value Ref Range Status   Specimen Description BLOOD RIGHT ARM  Final   Special Requests BOTTLES DRAWN AEROBIC AND ANAEROBIC 10CC  Final   Culture  Setup Time   Final    09/05/2014 13:20 Performed at [facility]    Culture   Final           BLOOD CULTURE RECEIVED NO GROWTH TO DATE CULTURE WILL BE HELD FOR 5 DAYS BEFORE ISSUING A FINAL NEGATIVE REPORT Performed at [facility]    Report Status PENDING  Incomplete  Culture, blood (routine x 2)     Status: None (Preliminary result)   Collection Time: 09/05/14  9:45 AM  Result Value Ref Range  Status   Specimen Description BLOOD RIGHT HAND  Final   Special Requests BOTTLES DRAWN AEROBIC AND ANAEROBIC 5CC  Final   Culture  Setup Time   Final    09/05/2014 13:20 Performed at [facility]    Culture   Final           BLOOD CULTURE RECEIVED NO GROWTH TO DATE CULTURE WILL BE HELD FOR 5 DAYS BEFORE ISSUING A FINAL NEGATIVE REPORT Performed at [facility]    Report Status PENDING  Incomplete  Culture, Urine     Status: None   Collection Time: 09/06/14  7:33 AM  Result Value Ref Range Status   Specimen Description URINE, RANDOM  Final   Special Requests NONE  Final   Culture  Setup Time   Final    09/06/2014 17:16 Performed at [place] Performed at [facility]   Final   Culture NO GROWTH Performed at [facility]   Final   Report Status 09/07/2014 FINAL  Final      Studies: No results found.  Scheduled Meds: . amoxicillin-clavulanate  800 mg Per Tube Q12H  . enoxaparin (LOVENOX) injection  40 mg Subcutaneous Q24H  . famotidine (PEPCID) IV  20 mg Intravenous Q12H  . feeding supplement (OSMOLITE 1.2 CAL)  1,000 mL Per Tube Q24H  . free water  100 mL Per Tube 3 times per day  . insulin aspart  0-9 Units Subcutaneous 6 times per day  . levothyroxine  75 mcg Per J Tube QAC breakfast  . metoCLOPramide  10 mg Per Tube TID AC & HS  . neomycin-bacitracin-polymyxin   Topical BID  . ondansetron (ZOFRAN) IV  16 mg Intravenous 3 times per day  . pantoprazole sodium  40 mg Per Tube BID  . predniSONE  60 mg Per Tube Q breakfast  . sodium chloride  10-40 mL Intracatheter Q12H   Continuous Infusions:   Time: 25 minutes  [facility] Pager [phone].   If 7PM-7AM, please contact night-coverage at [email or web address], password [facility] 09/08/2014, 9:04 AM  LOS: 13 days

## 2014-09-09 LAB — GLUCOSE, CAPILLARY
GLUCOSE-CAPILLARY: 189 mg/dL — AB (ref 70–99)
Glucose-Capillary: 165 mg/dL — ABNORMAL HIGH (ref 70–99)
Glucose-Capillary: 148 mg/dL — ABNORMAL HIGH (ref 70–99)
Glucose-Capillary: 133 mg/dL — ABNORMAL HIGH (ref 70–99)

## 2014-09-09 LAB — PROCALCITONIN: Procalcitonin: 0.75 ng/mL

## 2014-09-09 MED ORDER — MORPHINE SULFATE (CONCENTRATE) 10 MG/0.5ML PO SOLN: 10.0000 mg | ORAL | Status: DC | PRN

## 2014-09-09 MED ORDER — OSMOLITE 1.2 CAL PO LIQD: 1000.0000 mL | ORAL | Status: DC

## 2014-09-09 MED ORDER — BACITRACIN-NEOMYCIN-POLYMYXIN OINTMENT TUBE: 1.0000 "application " | TOPICAL_OINTMENT | Freq: Two times a day (BID) | CUTANEOUS | Status: DC

## 2014-09-09 MED ORDER — PROMETHAZINE HCL 25 MG RE SUPP: 25.0000 mg | Freq: Four times a day (QID) | RECTAL | Status: DC | PRN

## 2014-09-09 MED ORDER — AMOXICILLIN-POT CLAVULANATE 400-57 MG/5ML PO SUSR: 800.0000 mg | Freq: Two times a day (BID) | ORAL | Status: AC

## 2014-09-09 MED ORDER — DIPHENHYDRAMINE HCL 12.5 MG/5ML PO SYRP: ORAL_SOLUTION | ORAL | Status: DC

## 2014-09-09 MED ORDER — PREDNISONE 20 MG PO TABS: ORAL_TABLET | ORAL | Status: DC

## 2014-09-09 MED ORDER — LEVOTHYROXINE SODIUM 75 MCG PO TABS: 75.0000 ug | ORAL_TABLET | Freq: Every day | ORAL | Status: DC

## 2014-09-09 MED ORDER — FREE WATER: 100.0000 mL | Freq: Three times a day (TID) | Status: AC

## 2014-09-09 MED ORDER — ALBUTEROL SULFATE HFA 108 (90 BASE) MCG/ACT IN AERS: 2.0000 | INHALATION_SPRAY | Freq: Four times a day (QID) | RESPIRATORY_TRACT | Status: DC | PRN

## 2014-09-09 MED ORDER — ONDANSETRON HCL 4 MG/5ML PO SOLN: 4.0000 mg | Freq: Three times a day (TID) | ORAL | Status: DC | PRN

## 2014-09-09 MED ORDER — METOCLOPRAMIDE HCL 5 MG/5ML PO SOLN: 10.0000 mg | Freq: Three times a day (TID) | ORAL | Status: DC

## 2014-09-09 MED ORDER — PANTOPRAZOLE SODIUM 40 MG PO PACK: 40.0000 mg | PACK | Freq: Two times a day (BID) | ORAL | Status: DC

## 2014-09-09 NOTE — Discharge Summary (Signed)
[facility]  Physician Discharge Summary   Patient ID: Sabrina Melton MRN: [id number] DOB/AGE: Jun 06, 1951 63 y.o.  Admit date: 08/26/2014 Discharge date: 09/09/2014  PCP: Lucretia Kern., DO  DISCHARGE DIAGNOSES:  Principal Problem:   Dehydration Active Problems:   Obstructive sleep apnea   Essential hypertension   Hypothyroidism   Hyperlipemia   Nausea with vomiting   Gastric cancer   Tachycardia   Orthostasis   Depression   GERD (gastroesophageal reflux disease)   Nausea & vomiting   Protein-calorie malnutrition, severe   Signet-ring cell carcinoma of stomach   RECOMMENDATIONS FOR OUTPATIENT FOLLOW UP: 1. Home health has been arranged. 2. Patient will be sent home with tube feedings.  DISCHARGE CONDITION: fair  Diet recommendation: Nothing by mouth. 2. Feedings as instructed.  Filed Weights   09/07/14 0436 09/08/14 0504 09/09/14 0900  Weight: 86.728 kg (191 lb 3.2 oz) 87.363 kg (192 lb 9.6 oz) 87.091 kg (192 lb)    INITIAL HISTORY: 63 y.o. female with history of recently diagnosed gastric cancer, hypothyroidism, gastroesophageal reflux disease, hypertension, depression, prior history of Hodgkin's disease who presented at oncologist's office for evaluation. Patient was noted to be orthostatic and noted to be tachycardic and dehydrated and a such patient was sent to the hospital as a direct admission for further evaluation and management. J-tube was placed for nutritional purposes as she was unable to take anything by mouth due to her cancer. Course complicated with ileus after J tube placed. She subsequently developed fever and significantly elevated WBC. She was started on broad-spectrum antibiotic coverage.  Consultants:  Oncology  Surgery  IR  Procedures:  J tube placement  PICC line on 11/21  HOSPITAL COURSE:   Gastric cancer Ascities fluid obtained during J-tube placement was sent for cytology, which showed - ADENOCARCINOMA WITH  SIGNET RING FEATURES. CT chest showed sub-carina lymph node. Patient was seen by her oncologist, Dr. Burr Medico. Initially plan was to place a Port-A-Cath in the hospital. However, due to leukocytosis and fever. This could not be done. This will be arranged as an outpatient.  Nausea with vomiting: This symptoms are most likely due to her gastric cancer. Despite multiple antiemetics she continued to have nausea and was spitting up some of her feedings. She was likely aspirating as well. She was subsequently started on Reglan. Symptoms have improved, but nausea persist. Have explained to the patient that this symptom may not improve any further. General surgery service placed J-tube on 11.11.2015 to provide nutrition. Due to her persistent nausea and vomiting she underwent repeat CT scans and abdominal films. The J-tube was functioning quite well. At the time of discharge, even though she was having occasional nausea her symptoms were reasonably well controlled.  Fever with Leukocytosis During the course of this hospitalization, patient had fever. Her WBC climbed. Chest x-ray showed small pleural effusions, but did not show any clear infiltrate. It was quite likely, the patient was aspirating. She did have some wheezing. She was started initially on broad-spectrum antibiotics. All cultures were negative. She was transitioned over to Augmentin. Fevers have subsided and WBC improved. Because she was wheezing she was started on steroids, which will be tapered down slowly.   Wheezing As discussed above. She was started on steroids for her wheezing. She was given nebulizer treatments. Wheezing is improved. She'll be slowly tapered off. Chest x-ray was done recently and did not reveal any concerning findings. She has a history of smoking in the past.  Facial Erythema She developed  facial erythema after she was started on vancomycin. This did improve with Benadryl.   Superficial thrombus in the left cephalic  vein Possibly due to peripheral IV. Use warm compresses and keep the extremity elevated. This does not appear to be infected based on examination. No need for anticoagulation.  Dehydration and hypokalemia  Improved with IVF.  Severe protein caloric malnutrition: She was started on tube feedings through her J-tube. Due to her nausea and vomiting. The rate of the feeding was very slowly increased. She is now up to 60 mL per hour and is tolerating it quite well. Nutritionist recommended have been communicated to the patient. She has been told to keep her head elevated to avoid aspiration.  Orthostasis Hypotension: Resolved. Blood pressure stable. She is noted to be tachycardic which appears to be her usual for her now.  Hypothyroidism TSH 7.0. She would continue her home medications.   Gastroesophageal reflux disease: Continue PPI;  Obstructive sleep apnea Continue CPAP at bedtime; patient has been refusing it in the hospital.  Depression: Overall Stable. Patient was not taking any meds at home.   Patient is sick. Prognosis of her cancer is not entirely clear. Patient and husband plan to seek a second opinion from oncologists in [place]. However, at the same time she is as stable for discharge as she can be. She'll be discharged home today.    PERTINENT LABS:  The results of significant diagnostics from this hospitalization (including imaging, microbiology, ancillary and laboratory) are listed below for reference.    Microbiology: Recent Results (from the past 240 hour(s))  Culture, blood (routine x 2)     Status: None (Preliminary result)   Collection Time: 09/05/14  9:38 AM  Result Value Ref Range Status   Specimen Description BLOOD RIGHT ARM  Final   Special Requests BOTTLES DRAWN AEROBIC AND ANAEROBIC 10CC  Final   Culture  Setup Time   Final    09/05/2014 13:20 Performed at [facility]    Culture   Final           BLOOD CULTURE RECEIVED NO GROWTH TO DATE CULTURE  WILL BE HELD FOR 5 DAYS BEFORE ISSUING A FINAL NEGATIVE REPORT Performed at [facility]    Report Status PENDING  Incomplete  Culture, blood (routine x 2)     Status: None (Preliminary result)   Collection Time: 09/05/14  9:45 AM  Result Value Ref Range Status   Specimen Description BLOOD RIGHT HAND  Final   Special Requests BOTTLES DRAWN AEROBIC AND ANAEROBIC 5CC  Final   Culture  Setup Time   Final    09/05/2014 13:20 Performed at [facility]    Culture   Final           BLOOD CULTURE RECEIVED NO GROWTH TO DATE CULTURE WILL BE HELD FOR 5 DAYS BEFORE ISSUING A FINAL NEGATIVE REPORT Performed at [facility]    Report Status PENDING  Incomplete  Culture, Urine     Status: None   Collection Time: 09/06/14  7:33 AM  Result Value Ref Range Status   Specimen Description URINE, RANDOM  Final   Special Requests NONE  Final   Culture  Setup Time   Final    09/06/2014 17:16 Performed at [place] Performed at [facility]   Final   Culture NO GROWTH Performed at [facility]   Final   Report Status 09/07/2014 FINAL  Final     Labs: Basic Metabolic Panel:  Recent Labs Lab 09/04/14 0420 09/05/14 0451 09/06/14 0515 09/07/14 0424 09/08/14 0410  NA 136* 142 140 136* 136*  K 3.8 4.8 4.4 3.7 4.0  CL 101 104 103 99 97  CO2 23 25 27 28 30   GLUCOSE 128* 133* 112* 152* 207*  BUN 5* 8 14 10 8   CREATININE 0.60 1.18* 0.92 0.70 0.69  CALCIUM 8.4 8.1* 8.1* 7.9* 8.6   Liver Function Tests:  Recent Labs Lab 09/06/14 0515 09/07/14 0424 09/08/14 0410  AST 56* 23 18  ALT 44* 30 25  ALKPHOS 130* 117 130*  BILITOT 0.3 0.2* 0.3  PROT 5.2* 5.2* 5.9*  ALBUMIN 1.8* 1.8* 2.1*   CBC:  Recent Labs Lab 09/04/14 0420 09/05/14 0451 09/06/14 0515 09/07/14 0424 09/08/14 0410  WBC 14.5* 24.2* 18.5* 14.6* 15.2*  HGB 11.5* 10.9* 9.8* 9.8* 10.4*  HCT 34.7* 33.1* 30.6* 30.4* 31.8*  MCV 98.0 99.4 100.7*  99.3 98.8  PLT 469* 366 414* 435* 489*   CBG:  Recent Labs Lab 09/08/14 2010 09/09/14 0007 09/09/14 0431 09/09/14 0801 09/09/14 1152  GLUCAP 170* 189* 165* 148* 133*     IMAGING STUDIES Dg Chest 2 View  09/05/2014   CLINICAL DATA:  Cough. History of hypertension. Also history of sleep apnea and gastric carcinoma pleural effusions. Subsequent encounter.  EXAM: CHEST  2 VIEW  COMPARISON:  09/01/2014  FINDINGS: Small, left greater than right, pleural effusions. Associated lung base opacity, also greater on the left, is likely atelectasis.  Vascular congestion interstitial thickening is decreased when compared the prior exam.  No new areas of lung opacity. Stable left apical pleural parenchymal scarring.  Heart is normal in size.  No mediastinal or hilar masses.  IMPRESSION: 1. Improved lung aeration with decreased vascular congestion and resolved interstitial thickening consistent with resolved interstitial edema. 2. Persistent, small, left greater than right, pleural effusions with associated left greater than right lung base atelectasis.   Electronically Signed   By: Lajean Manes M.D.   On: 09/05/2014 11:33   Dg Abd 1 View  09/01/2014   CLINICAL DATA:  63 year old female with percutaneous jejunostomy tube requiring placement verification. Initial encounter.  EXAM: ABDOMEN - 1 VIEW  COMPARISON:  Abdominal radiographs 08/26/2014 and earlier.  CONTRAST:  50 mL Omnipaque 300  FINDINGS: KUB of the abdomen at 1546 hr following contrast injection through the indwelling J tube. Several small bowel loops are opacified in the left mid abdomen. No evidence of abnormal contrast accumulation or leak. Following this image, the tube was flushed with 50 mL of water.  Scattered surgical clips in the abdomen are stable. Non obstructed bowel gas pattern. Abdominal and pelvic visceral contours are stable. No acute osseous abnormality identified.  IMPRESSION: Percutaneous J-tube with no adverse features.    Electronically Signed   By: Lars Pinks M.D.   On: 09/01/2014 16:13   Ct Chest W Contrast  08/27/2014   CLINICAL DATA:  Recent diagnosis of gastric carcinoma. Currently with fatigue  EXAM: CT CHEST WITH CONTRAST  TECHNIQUE: Multidetector CT imaging of the chest was performed during intravenous contrast administration.  CONTRAST:  76mL OMNIPAQUE IOHEXOL 300 MG/ML  SOLN  COMPARISON:  Chest radiograph August 26, 2014; CT abdomen and pelvis August 06, 2014  FINDINGS: There are moderate pleural effusions bilaterally with patchy atelectasis in both lung bases. There is a moderate pericardial effusion. There is focal consolidation in the medial left apex. There is no appreciable parenchymal  lung mass.  There are small mediastinal lymph nodes at several sites. There is a precarinal lymph node measuring 1.6 x 1.2 cm. There is a sub- carinal lymph node measuring 1.6 by 1.1 cm. Several smaller lymph nodes are also present. There is atherosclerotic change in the aorta but no aneurysm or dissection. No pulmonary embolus appreciable.  There is generalized dilatation of the esophagus.  In the visualized upper abdomen, there is ascites noted, apparently new compared to prior study. There is mesenteric thickening in the left abdomen, new from prior study. Extensive wall thickening throughout the stomach persists. Multiple surgical clips in the upper abdomen remain. There is atherosclerotic change in the aorta.  There are no blastic or lytic bone lesions. There is a 7 x 7 mm nodular lesion in the right lobe of the thyroid. The left lobe of the thyroid is essentially completely absent.  IMPRESSION: Bilateral effusions with bibasilar atelectatic change. Focal area of infiltrate in the medial left apex with air bronchograms in this area.  Moderate pericardial effusion.  Mild lymph node prominence in the precarinal and subcarinal regions.  Small nodular lesion in the right lobe of the thyroid. This finding may warrant nonemergent  thyroid ultrasound to further assess.  Persistent gastric wall thickening. There is new inflammatory change in the mid abdomen, particularly surrounding the third portion of the duodenum. There is also mild ascites. Given this change compared to prior studies, it may be prudent to obtain complete abdomen and pelvis CT to further assess. No free air or portal venous air is seen. Multiple surgical clips are noted in the upper abdomen, stable.  These results will be called to the ordering clinician or representative by the Radiologist Assistant, and communication documented in the PACS or zVision Dashboard.   Electronically Signed   By: Lowella Grip M.D.   On: 08/27/2014 11:17   Dg Chest Port 1 View  09/01/2014   CLINICAL DATA:  63 year old female with history of sleep apnea, gastric cancer and pleural effusions. Prior smoker. Subsequent encounter.  EXAM: PORTABLE CHEST - 1 VIEW  COMPARISON:  08/27/2014 CT.  01/09/2013 chest x-ray.  FINDINGS: Medial left upper lobe consolidation with left apical pleural thickening similar to prior exam. Etiology indeterminate.  Pulmonary vascular congestion/ pulmonary edema with bilateral pleural effusions. Passive atelectasis lung bases suspected. Difficult to exclude basilar infiltrate given the pleural effusions/atelectasis.  No gross pneumothorax.  Calcified aorta.  Heart size within normal limits.  Surgical clips left upper quadrant.  IMPRESSION: Pulmonary vascular congestion/ pulmonary edema with bilateral pleural effusions. Passive atelectasis lung bases suspected. Difficult to exclude basilar infiltrate given the pleural effusions/atelectasis.  Medial left upper lobe consolidation with left apical pleural thickening similar to prior exam. Etiology indeterminate.   Electronically Signed   By: Chauncey Cruel M.D.   On: 09/01/2014 07:36   Acute Abdominal Series  08/26/2014   CLINICAL DATA:  Left upper abdominal pain  EXAM: ACUTE ABDOMEN SERIES (ABDOMEN 2 VIEW & CHEST 1  VIEW)  COMPARISON:  CT abdomen 08/06/2014  FINDINGS: There is a relative paucity of bowel gas which may reflect decompressed bowel versus fluid-filled bowel. There are multiple surgical clips in the abdomen. No radiopaque calculi or other significant radiographic abnormality is seen. Heart size and mediastinal contours are within normal limits. Small left pleural effusion.  IMPRESSION: 1. There is a relative paucity of bowel gas which may reflect decompressed bowel versus fluid-filled bowel. If there is clinical concern regarding bowel obstruction, evaluation with CT of the  abdomen/pelvis is recommended.   Electronically Signed   By: Kathreen Devoid   On: 08/26/2014 16:15    DISCHARGE EXAMINATION: Filed Vitals:   09/08/14 1358 09/08/14 2134 09/09/14 0652 09/09/14 0900  BP: 152/84 146/83 172/92   Pulse: 112 118 123   Temp: 98.9 F (37.2 C) 98.4 F (36.9 C) 98.1 F (36.7 C)   TempSrc: Oral Oral Oral   Resp: 16 16 16    Height:      Weight:    87.091 kg (192 lb)  SpO2: 92% 99% 93%    General appearance: alert, cooperative, appears stated age and no distress Resp: Decreased air entry at the bases, but without any crackles or wheezing. Cardio: regular rate and rhythm, S1, S2 normal, no murmur, click, rub or gallop GI: soft, non-tender; bowel sounds normal; no masses,  no organomegaly and J-tube in place  DISPOSITION: Home with husband  Discharge Instructions    Call MD for:  difficulty breathing, headache or visual disturbances    Complete by:  As directed      Call MD for:  extreme fatigue    Complete by:  As directed      Call MD for:  persistant dizziness or light-headedness    Complete by:  As directed      Call MD for:  persistant nausea and vomiting    Complete by:  As directed      Call MD for:  severe uncontrolled pain    Complete by:  As directed      Call MD for:  temperature >100.4    Complete by:  As directed      Discharge instructions    Complete by:  As directed   Please  call Dr. Ernestina Penna office for follow up. Nothing by mouth. All meds only via feeding tube as instructed.     Increase activity slowly    Complete by:  As directed            ALLERGIES: No Known Allergies  Discharge Medication List as of 09/09/2014 11:53 AM    START taking these medications   Details  albuterol (PROVENTIL HFA;VENTOLIN HFA) 108 (90 BASE) MCG/ACT inhaler Inhale 2 puffs into the lungs every 6 (six) hours as needed for wheezing or shortness of breath., Starting 09/09/2014, Until Discontinued, Print    amoxicillin-clavulanate (AUGMENTIN) 400-57 MG/5ML suspension Place 10 mLs (800 mg total) into feeding tube every 12 (twelve) hours. For 7 more days., Starting 09/09/2014, Until Discontinued, Print    diphenhydrAMINE (BENYLIN) 12.5 MG/5ML syrup 12.5mg  via tube every 6 hrs as needed for itching, rash, Print    metoCLOPramide (REGLAN) 5 MG/5ML solution Place 10 mLs (10 mg total) into feeding tube 3 (three) times daily., Starting 09/09/2014, Until Discontinued, Print    Morphine Sulfate (MORPHINE CONCENTRATE) 10 MG/0.5ML SOLN concentrated solution Place 0.5 mLs (10 mg total) into feeding tube every 3 (three) hours as needed for severe pain., Starting 09/09/2014, Until Discontinued, Print    neomycin-bacitracin-polymyxin (NEOSPORIN) OINT Apply 1 application topically 2 (two) times daily. Apply around the j tube insertion site till improved., Starting 09/09/2014, Until Discontinued, Print    Nutritional Supplements (FEEDING SUPPLEMENT, OSMOLITE 1.2 CAL,) LIQD Place 1,000 mLs into feeding tube daily. At 11ml/hr, Starting 09/09/2014, Until Discontinued, Print    ondansetron (ZOFRAN) 4 MG/5ML solution Place 5 mLs (4 mg total) into feeding tube every 8 (eight) hours as needed for nausea or vomiting., Starting 09/09/2014, Until Discontinued, Print    pantoprazole sodium (PROTONIX) 40  mg/20 mL PACK Place 20 mLs (40 mg total) into feeding tube 2 (two) times daily., Starting 09/09/2014, Until  Discontinued, Print    predniSONE (DELTASONE) 20 MG tablet Dissolve completely in water prior to administering via j tube. 3 tablets once daily for 2 days, then 2 tablets once daily for 4 days, then 1 tablet once daily for 4 days, then stop., Print    Water For Irrigation, Sterile (FREE WATER) SOLN Place 100 mLs into feeding tube every 8 (eight) hours., Starting 09/09/2014, Until Discontinued, Print      CONTINUE these medications which have CHANGED   Details  levothyroxine (SYNTHROID, LEVOTHROID) 75 MCG tablet Take 1 tablet (75 mcg total) by mouth daily. Please dissolve completely in water prior to administering through J tube., Starting 09/09/2014, Until Discontinued, Print    promethazine (PHENERGAN) 25 MG suppository Place 1 suppository (25 mg total) rectally every 6 (six) hours as needed for nausea or vomiting., Starting 09/09/2014, Until Discontinued, Print      STOP taking these medications     Dexlansoprazole (DEXILANT) 30 MG capsule      diltiazem (CARDIZEM CD) 120 MG 24 hr capsule      ondansetron (ZOFRAN-ODT) 4 MG disintegrating tablet      diltiazem (CARDIZEM CD) 120 MG 24 hr capsule      fluticasone (FLONASE) 50 MCG/ACT nasal spray        Follow-up Information    Follow up with [facility].   Why:  home health nurse for new J tube feeding   Contact information:   [address] [place] [address] [phone]       Follow up with Rosario Adie., MD On 01/0/2725.   Specialty:  General Surgery   Why:  Be at our office for check in at 9:30 AM.   Contact information:   [place] [place] [address] [phone]       Follow up with Truitt Merle, MD.   Specialties:  Hematology, Oncology   Why:  please call to schedule follow up.   Contact information:   [place] [address] [phone]       TOTAL DISCHARGE TIME: 35 minutes  [facility] Pager [phone]  09/09/2014,  3:31 PM

## 2014-09-09 NOTE — Discharge Instructions (Signed)
Tube Feeding Home regimen:  Osmolite 1.2 via J-tube.Goal: 65 ml x 24/day or 75 ml for 20 hours per day.Patient can increase rate 10 ml per 24 hours until goal reached. Optimum goal of 120 ml for 13 hours per night. Plus Free water 120 ml before TF started and after TF ends for the day plus 240 ml tid which can be given with meds and flushes.    Jejunosotomy Tube Home Guide A jejunostomy tube is a tube that is surgically placed into the small bowel. It is also called a "J-tube." J-tubes are used when a person is unable to eat and drink enough on their own to stay healthy. The tube is inserted into the small bowel through a small cut (incision) in the skin. This tube is used for:  Feeding.  Giving medication. J-TUBE CARE  Wash your hands with soap and water.  Remove the old dressing (if any). Some styles of G-tubes may need a dressing inserted between the skin and the J-tube. .   Check the area where the tube enters the skin (insertion site) for redness, swelling, or pus-like (purulent) drainage. A small amount of clear or tan liquid drainage is normal. Check to make sure scar tissue (skin) is not growing around the insertion site. This could have a raised, bumpy appearance.  A cotton swab can be used to clean the skin around the tube:  When the J-tube is first put in, a normal saline solution or water can be used to clean the skin.  Mild soap and warm water can be used when the skin around the J-tube site has healed.  Roll the cotton swab around the J-tube insertion site to remove any drainage or crusting at the insertion site. STOMACH RESIDUALS Feeding tube residuals are the amount of liquids that are in the stomach at any given time. Residuals may be checked before giving feedings, medications, or as instructed by your health care provider.  Ask your health care provider if there are instances when you would not start tube feedings depending on the amount or type of contents  withdrawn from the stomach.  Check residuals by attaching a syringe to the J-tube and pulling back on the syringe plunger. Note the amount, and return the residual back into the stomach. FLUSHING THE G-TUBE  The G-tube should be periodically flushed with clean warm water to keep it from clogging.  Flush the G-tube after feedings or medications. Draw up 30 mL of warm water in a syringe. Connect the syringe to the G-tube and slowly push the water into the tube.  Do not push feedings, medications, or flushes rapidly. Flush the G-tube gently and slowly.  Only use syringes made for J-tubes to flush medications or feedings.  Your health care provider may want the J-tube flushed more often or with more water. If this is the case, follow your health care provider's instructions. FEEDINGS Your health care provider will determine whether feedings are given as a bolus (a certain amount given at one time and at scheduled times) or whether feedings will be given continuously on a feeding pump.   Formulas should be given at room temperature.  If feedings are continuous, no more than 4 hours worth of feedings should be placed in the feeding bag. This helps prevent spoilage or accidental excess infusion.  Cover and place unused formula in the refrigerator.  If feedings are continuous, stop the feedings when medications or flushes are given. Be sure to restart the feedings.  Feeding bags and syringes should be replaced as instructed by your health care provider. GIVING MEDICATION   In general, it is best if all medications are in a liquid form for J-tube administration.   No crushed pills of any type.  Draw up the medication into the syringe.  Attach the syringe to the J-tube and slowly push the mixture into the J-tube.  After giving the medication, draw up 30 mL of warm water in the syringe and slowly flush the j-tube.  For pills or capsules, check with your health care provider first before  crushing medications. Some pills are not effective if they are crushed. Some capsules are sustained-release medications.  If appropriate, crush the pill or capsule and mix with 30 mL of warm water. Using the syringe, slowly push the medication through the tube, then flush the tube with another 30 mL of tap water. G-TUBE PROBLEMS G-tube was pulled out.  Cause: May have been pulled out accidentally.  Solutions: Cover the opening with clean dressing and tape. Call your health care provider right away. The G-tube should be put in as soon as possible (within 4 hours) so the G-tube opening (tract) does not close. The G-tube needs to be put in at a health care setting. An X-ray needs to be done to confirm placement before the G-tube can be used again. Redness, irritation, soreness, or foul odor around the gastrostomy site.  Cause: May be caused by leakage or infection.  Solutions: Call your health care provider right away. Large amount of leakage of fluid or mucus-like liquid present (a large amount means it soaks clothing).  Cause: Many reasons could cause the J-tube to leak.  Solutions: Call your health care provider to discuss the amount of leakage. Skin or scar tissue appears to be growing where tube enters skin.   Cause: Tissue growth may develop around the insertion site if the J-tube is moved or pulled on excessively.  Solutions: Secure tube with tape so that excess movement does not occur. Call your health care provider. G-tube is clogged.  Cause: Thick formula or medication.  Solutions: Try to slowly push warm water into the tube with a large syringe. Never try to push any object into the tube to unclog it. Do not force fluid into the J-tube. If you are unable to unclog the tube, call your health care provider right away. TIPS  Head of bed (HOB) position refers to the upright position of a person's upper body.  When giving medications or a feeding bolus, keep the [facility] up as told by  your health care provider. Do this during the feeding and for 1 hour after the feeding or medication administration.  If continuous feedings are being given, it is best to keep the [facility] up as told by your health care provider. When ADLs (activities of daily living) are performed and the [facility] needs to be flat, be sure to turn the feeding pump off. Restart the feeding pump when the [facility] is returned to the recommended height.  Do not pull or put tension on the tube.  To prevent fluid backflow, kink the J-tube before removing the cap or disconnecting a syringe.  Check the J-tube length every day. Measure from the insertion site to the end of the G-tube. If the length is longer than previous measurements, the tube may be coming out. Call your health care provider if you notice increasing G-tube length.  Oral care, such as brushing teeth, must be continued.  You may  need to remove excess air (vent) from the G-tube. Your health care provider will tell you if this is needed.  Always call your health care provider if you have questions or problems with the G-tube. SEEK IMMEDIATE MEDICAL CARE IF:   You have severe abdominal pain, tenderness, or abdominal bloating (distension).  You have nausea or vomiting.  You are constipated or have problems moving your bowels.  The J-tube insertion site is red, swollen, has a foul smell, or has yellow or brown drainage.  You have difficulty breathing or shortness of breath.  You have a fever.  You have a large amount of feeding tube residuals.  The G-tube is clogged and cannot be flushed. MAKE SURE YOU:   Understand these instructions.  Will watch your condition.  Will get help right away if you are not doing well or get worse. Document Released: 12/12/2001 Document Revised: 02/17/2014 Document Reviewed: 06/10/2013 [facility] Patient Information 2015 [place], [place]. This information is not intended to replace advice given to you by your health care  provider. Make sure you discuss any questions you have with your health care provider.

## 2014-09-09 NOTE — Care Management Note (Signed)
CARE MANAGEMENT NOTE 09/09/2014  Patient:  Sabrina Melton, Sabrina Melton   Account Number:  [id number]  Date Initiated:  08/27/2014  Documentation initiated by:  Marney Doctor  Subjective/Objective Assessment:   63 yo admitted with Dehydration.  Hx of  recently diagnosed gastric cancer, hypothyroidism, gastroesophageal reflux disease, hypertension, depression, prior history of Hodgkin's disease     Action/Plan:   From home with spouse   Anticipated DC Date:  09/09/2014   Anticipated DC Plan:  [place]  CM consult      [facility] Choice  HOME HEALTH   Choice offered to / List presented to:  C-1 Patient   DME arranged  3-N-1  TUBE FEEDING PUMP  TUBE FEEDING      DME agency  [place] arranged  HH-1 RN      [place].   Status of service:  In process, will continue to follow Medicare Important Message given?   (If response is "NO", the following Medicare IM given date fields will be blank) Date Medicare IM given:   Medicare IM given by:   Date Additional Medicare IM given:   Additional Medicare IM given by:    Discharge Disposition:    Per UR Regulation:  Reviewed for med. necessity/level of care/duration of stay  If discussed at [place] of Stay Meetings, dates discussed:   09/04/2014  09/09/2014    Comments:  09/09/14 Marney Doctor RN,BSN,NCM [phone] Pt to dc home today with husband.  HHRN orders written by MD as well as 3 in 1 and feeding pump and tube feeding. [facility] aware of orders.  No other CM needs noted.  08/28/14 15:00 CM met with pt in room in anticipation of HHRN need for new J tube and feedings.  CM spoke with Nutritionist Clarise Cruz who states pt will be going home with bolus feedings.  Pt states she is ambulatory and does not feel she will need andy PT/OT.  Pt has the support of her Truett Mainland [phone].Marland Kitchen  Referral called to [facility] rep, Kristen.  Request for [facility] orders and  Enteral orders made by CM (sticky and RN ).  Will continue to monitor for any add on services for [facility].  Mariane Masters, BSN, [place].  08/27/14 Marney Doctor RN,BSN,NCM [phone] Chart reviewed and CM following for DC needs.  Pt to OR today for staging laparoscopy, possible biopsy and J tube placement.

## 2014-09-10 ENCOUNTER — Telehealth: Payer: Self-pay | Admitting: Internal Medicine

## 2014-09-10 ENCOUNTER — Other Ambulatory Visit: Payer: Self-pay | Admitting: *Deleted

## 2014-09-10 DIAGNOSIS — C169 Malignant neoplasm of stomach, unspecified: Secondary | ICD-10-CM

## 2014-09-10 MED ORDER — PROMETHAZINE HCL 25 MG RE SUPP: 25.0000 mg | Freq: Four times a day (QID) | RECTAL | Status: DC | PRN

## 2014-09-10 NOTE — Telephone Encounter (Signed)
Husband requested refill of phenergan supp.  They were discharged with a quantitiy of only #12 and she is using 3 per day.  Discussed with Dr. Burr Medico and order placed for #60 with 2 refills.  Called husband at [phone] and let him know that refills are ordered at pharmacy he requested.

## 2014-09-10 NOTE — Telephone Encounter (Signed)
New msg   Sabrina Melton calling, requesting for Sabrina Melton to be contacted at [phone]. Patient wants to see if Diltiavem 24hr is avail. In liquid form because Trish is taking everything in a feeding tube right now.

## 2014-09-10 NOTE — Progress Notes (Signed)
Husband called and requested follow up visit on 12/4. Discussed with Dr. Burr Medico.  [place] for 12/4.  Called husband back and let him know Dr. Burr Medico will see her on 09/19/13 @ 12:30.  He appreciated our efforts to schedule this.

## 2014-09-10 NOTE — Telephone Encounter (Signed)
Patient states she was prescribed Diltiazem CD 120mg  last Oct at an OV with Dr. Harrington Challenger. Nothing on the med sheet to indicate that. She was recently discharged from [facility] with diagnosis of signet ring cell carcinoma of stomach, and she now has a feeding tube. Asking if Diltiazem comes in liquid form. It does not. It is marked as a do not take medication on her discharge sheet. States she takes it for hypertension and tachycardia. Pulse is running around 110. She is going for a second opinion at [facility] for her stomach cancer.

## 2014-09-11 LAB — CULTURE, BLOOD (ROUTINE X 2)
Culture: NO GROWTH
Culture: NO GROWTH

## 2014-09-12 ENCOUNTER — Telehealth: Payer: Self-pay | Admitting: *Deleted

## 2014-09-12 ENCOUNTER — Telehealth: Payer: Self-pay | Admitting: Hematology

## 2014-09-12 MED ORDER — LORAZEPAM 0.5 MG PO TABS: 0.5000 mg | ORAL_TABLET | Freq: Every evening | ORAL | Status: DC | PRN

## 2014-09-12 NOTE — Telephone Encounter (Signed)
She can move to 30 mg immediate release 3x per day to start. Can't mash up extended release

## 2014-09-12 NOTE — Telephone Encounter (Signed)
Discussed anxiety at bedtime with Dr. Burr Medico.  Will give pt. SL Ativan.  Spoke with husband about how to use.  Gave instructions for sublingual use and may repeat X1 after 30 minutes.  They would prefer to use [facility] [address].  RX called in.

## 2014-09-12 NOTE — Telephone Encounter (Signed)
Husband, Collier Salina is calling.  Verlean is having problems sleeping at night.  She is up every 2 hours.  She attributes this to anxiety.  They would like something for this and it would need to be in a liquid form. Husbands call back number is [phone].  Will discuss with Dr. Burr Medico

## 2014-09-12 NOTE — Telephone Encounter (Signed)
Spoke with pt and pharmacist, the patient can take the diltiazem she has. She was instructed to pull the capsule apart and there will be on tiny beads that she can flush down the feeding tube. Patient voiced understanding

## 2014-09-12 NOTE — Telephone Encounter (Signed)
, °

## 2014-09-14 ENCOUNTER — Emergency Department (HOSPITAL_COMMUNITY): Payer: BC Managed Care – PPO

## 2014-09-14 ENCOUNTER — Emergency Department (HOSPITAL_COMMUNITY)
Admission: EM | Admit: 2014-09-14 | Discharge: 2014-09-14 | Disposition: A | Discharge status: Home / Self Care | Payer: BC Managed Care – PPO | Attending: Emergency Medicine | Admitting: Emergency Medicine

## 2014-09-14 ENCOUNTER — Encounter (HOSPITAL_COMMUNITY): Payer: Self-pay | Admitting: Emergency Medicine

## 2014-09-14 DIAGNOSIS — Z9889 Other specified postprocedural states: Secondary | ICD-10-CM | POA: Insufficient documentation

## 2014-09-14 DIAGNOSIS — Z79899 Other long term (current) drug therapy: Secondary | ICD-10-CM | POA: Insufficient documentation

## 2014-09-14 DIAGNOSIS — Z85028 Personal history of other malignant neoplasm of stomach: Secondary | ICD-10-CM | POA: Insufficient documentation

## 2014-09-14 DIAGNOSIS — G4733 Obstructive sleep apnea (adult) (pediatric): Secondary | ICD-10-CM | POA: Insufficient documentation

## 2014-09-14 DIAGNOSIS — IMO0002 Reserved for concepts with insufficient information to code with codable children: Secondary | ICD-10-CM

## 2014-09-14 DIAGNOSIS — Z8742 Personal history of other diseases of the female genital tract: Secondary | ICD-10-CM | POA: Insufficient documentation

## 2014-09-14 DIAGNOSIS — Z8781 Personal history of (healed) traumatic fracture: Secondary | ICD-10-CM | POA: Diagnosis not present

## 2014-09-14 DIAGNOSIS — Z87891 Personal history of nicotine dependence: Secondary | ICD-10-CM | POA: Insufficient documentation

## 2014-09-14 DIAGNOSIS — Z9981 Dependence on supplemental oxygen: Secondary | ICD-10-CM | POA: Diagnosis not present

## 2014-09-14 DIAGNOSIS — K9413 Enterostomy malfunction: Secondary | ICD-10-CM | POA: Diagnosis present

## 2014-09-14 DIAGNOSIS — E039 Hypothyroidism, unspecified: Secondary | ICD-10-CM | POA: Insufficient documentation

## 2014-09-14 DIAGNOSIS — Z8659 Personal history of other mental and behavioral disorders: Secondary | ICD-10-CM | POA: Insufficient documentation

## 2014-09-14 DIAGNOSIS — Z8571 Personal history of Hodgkin lymphoma: Secondary | ICD-10-CM | POA: Diagnosis not present

## 2014-09-14 DIAGNOSIS — K219 Gastro-esophageal reflux disease without esophagitis: Secondary | ICD-10-CM | POA: Diagnosis not present

## 2014-09-14 DIAGNOSIS — I10 Essential (primary) hypertension: Secondary | ICD-10-CM | POA: Diagnosis not present

## 2014-09-14 MED ORDER — IOHEXOL 300 MG/ML  SOLN
50.0000 mL | Freq: Once | INTRAMUSCULAR | Status: AC | PRN
  Administered 2014-09-14: 50 mL via ORAL

## 2014-09-14 MED ORDER — MORPHINE SULFATE 10 MG/5ML PO SOLN: 10.0000 mg | Freq: Once | ORAL | Status: DC

## 2014-09-14 MED ORDER — MORPHINE SULFATE 10 MG/5ML PO SOLN: 5.0000 mg | Freq: Once | ORAL | Status: DC

## 2014-09-14 NOTE — ED Provider Notes (Signed)
CSN: [id number]     Arrival date & time 09/14/14  1350 History   First MD Initiated Contact with Patient 09/14/14 1503     Chief Complaint  Patient presents with  . j tube clogged      (Consider location/radiation/quality/duration/timing/severity/associated sxs/prior Treatment) The history is provided by the patient and medical records. No language interpreter was used.     Sabrina Melton is a 63 y.o. female  with a hx of recently diagnosed stomach cancer and subsequent s/p J-tube placement on 08/27/14 presents to the Emergency Department complaining of clogging of the J-tube at 10:30am.  Pt's husband, who is her care taker, reports giving several dissolved medications through the tube per usual this morning, including a Protonix suspension.  He then gave a diltiazem suspension which is new as of today to help control her tachycardia.  Pt reports no increased pain since the tube clogged.  No associated symptoms.  Pt without fever, chills, redness to the site.  Husband and pt deny any pulling of the tube or known displacement.  Pt's husband reports speaking to home health who recommended cold water, hot water and coca-cola which he has tried without relief.      Past Medical History  Diagnosis Date  . 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 Hypothyroidism   . Obstructive sleep apnea 05/21/09    USES CPAP  . LIVER FUNCTION TESTS, ABNORMAL, HX OF 01/30/2009    Qualifier: Diagnosis of  By: Harrington Challenger, MD, [place]   . PONV (postoperative nausea and vomiting)   . Hypertension   . Depression   . GERD (gastroesophageal reflux disease)   . Leiomyoma 2015    Multiple small on ultrasound, largest 24 mm  . Hodgkin disease     HISTORY OF  . Stomach cancer    Past Surgical History  Procedure Laterality Date  . Splenectomy, total  1977  . Tonsillectomy    . Open reduction and internal fixation of tibial shaft fracture with ext into the plafond  03/29/07    Angelena Form, M.D.  [facility])  . Dilation and curettage of uterus    . Colposcopy    . Gynecologic cryosurgery    . Thyroid lobectomy Left 03/14/2013    Dr Constance Holster  . Thyroidectomy N/A 03/14/2013    Procedure: LEFT THYROID LOBECTOMY WITH FROZEN SECTION;  Surgeon: Izora Gala, MD;  Location: [facility];  Service: ENT;  Laterality: N/A;  . Hysteroscopy  2010    HYSTEROSCOPY,D&C FOR PMB AND ENDO POLYP  . Cesarean section  1979  . Laparoscopy N/A 08/27/2014    Procedure: Exploratory Laparoscopy,  abdominal biopsy and j tube placement;  Surgeon: Leighton Ruff, MD;  Location: [facility];  Service: General;  Laterality: N/A;   Family History  Problem Relation Age of Onset  . Breast cancer Maternal Grandmother   . Hyperlipidemia Father    History  Substance Use Topics  . Smoking status: Former Smoker -- 0.25 packs/day for 40 years    Types: Cigarettes    Quit date: 03/02/2007  . Smokeless tobacco: Never Used  . Alcohol Use: Yes     Comment: 1-2 drink a few times per week   OB History    Gravida Para Term Preterm AB TAB SAB Ectopic Multiple Living   2 1 1  1     1      Review of Systems  Constitutional: Negative for fever, diaphoresis, appetite change, fatigue and unexpected weight change.  HENT: Negative for mouth  sores.   Eyes: Negative for visual disturbance.  Respiratory: Negative for cough, chest tightness, shortness of breath and wheezing.   Cardiovascular: Negative for chest pain.  Gastrointestinal: Positive for nausea (chronic) and abdominal pain (chronic, unchanged). Negative for vomiting, diarrhea and constipation.       J-Tube clogged  Endocrine: Negative for polydipsia, polyphagia and polyuria.  Genitourinary: Negative for dysuria, urgency, frequency and hematuria.  Musculoskeletal: Negative for back pain and neck stiffness.  Skin: Negative for rash.  Allergic/Immunologic: Negative for immunocompromised state.  Neurological: Negative for syncope, light-headedness and headaches.  Hematological: Does not  bruise/bleed easily.  Psychiatric/Behavioral: Negative for sleep disturbance. The patient is not nervous/anxious.       Allergies  Review of patient's allergies indicates no known allergies.  Home Medications   Prior to Admission medications   Medication Sig Start Date End Date Taking? Authorizing Provider  albuterol (PROVENTIL HFA;VENTOLIN HFA) 108 (90 BASE) MCG/ACT inhaler Inhale 2 puffs into the lungs every 6 (six) hours as needed for wheezing or shortness of breath. 09/09/14  Yes Bonnielee Haff, MD  amoxicillin-clavulanate (AUGMENTIN) 400-57 MG/5ML suspension Place 10 mLs (800 mg total) into feeding tube every 12 (twelve) hours. For 7 more days. 09/09/14  Yes Bonnielee Haff, MD  diltiazem (DILTIAZEM CD) 120 MG 24 hr capsule Take 120 mg by mouth daily.   Yes Historical Provider, MD  diphenhydrAMINE (BENYLIN) 12.5 MG/5ML syrup 12.5mg  via tube every 6 hrs as needed for itching, rash 09/09/14  Yes Bonnielee Haff, MD  levothyroxine (SYNTHROID, LEVOTHROID) 75 MCG tablet Take 1 tablet (75 mcg total) by mouth daily. Please dissolve completely in water prior to administering through J tube. 09/09/14  Yes Bonnielee Haff, MD  metoCLOPramide (REGLAN) 5 MG/5ML solution Place 10 mLs (10 mg total) into feeding tube 3 (three) times daily. 09/09/14  Yes Bonnielee Haff, MD  Morphine Sulfate (MORPHINE CONCENTRATE) 10 MG/0.5ML SOLN concentrated solution Place 0.5 mLs (10 mg total) into feeding tube every 3 (three) hours as needed for severe pain. 09/09/14  Yes Bonnielee Haff, MD  neomycin-bacitracin-polymyxin (NEOSPORIN) OINT Apply 1 application topically 2 (two) times daily. Apply around the j tube insertion site till improved. 09/09/14  Yes Bonnielee Haff, MD  Nutritional Supplements (FEEDING SUPPLEMENT, OSMOLITE 1.2 CAL,) LIQD Place 1,000 mLs into feeding tube daily. At 56ml/hr 09/09/14  Yes Bonnielee Haff, MD  ondansetron [facility]) 4 MG/5ML solution Place 5 mLs (4 mg total) into feeding tube every 8 (eight)  hours as needed for nausea or vomiting. 09/09/14  Yes Bonnielee Haff, MD  pantoprazole sodium (PROTONIX) 40 mg/20 mL PACK Place 20 mLs (40 mg total) into feeding tube 2 (two) times daily. 09/09/14  Yes Bonnielee Haff, MD  predniSONE (DELTASONE) 20 MG tablet Dissolve completely in water prior to administering via j tube. 3 tablets once daily for 2 days, then 2 tablets once daily for 4 days, then 1 tablet once daily for 4 days, then stop. 09/09/14  Yes Bonnielee Haff, MD  promethazine (PHENERGAN) 25 MG suppository Place 1 suppository (25 mg total) rectally every 6 (six) hours as needed for nausea or vomiting. 09/10/14  Yes Truitt Merle, MD  Water For Irrigation, Sterile (FREE WATER) SOLN Place 100 mLs into feeding tube every 8 (eight) hours. 09/09/14  Yes Bonnielee Haff, MD  LORazepam (ATIVAN) 0.5 MG tablet Place 1 tablet (0.5 mg total) under the tongue at bedtime as needed and may repeat dose one time if needed for anxiety. 09/12/14   Truitt Merle, MD   BP 118/68 mmHg  Pulse 102  Temp(Src) 98.2 F (36.8 C) (Oral)  Resp 16  SpO2 94% Physical Exam  Constitutional: She appears well-developed and well-nourished. No distress.  Awake, alert, nontoxic appearance  HENT:  Head: Normocephalic and atraumatic.  Mouth/Throat: Oropharynx is clear and moist. No oropharyngeal exudate.  Eyes: Conjunctivae are normal. No scleral icterus.  Neck: Normal range of motion. Neck supple.  Cardiovascular: Normal rate, regular rhythm and intact distal pulses.   Pulmonary/Chest: Effort normal and breath sounds normal. No respiratory distress. She has no wheezes.  Equal chest expansion  Abdominal: Soft. Bowel sounds are normal. She exhibits no distension and no mass. There is generalized tenderness. There is no rebound, no guarding and no CVA tenderness.  Mild, generalized tenderness Healing midline abd incision with staples in place - no erythema, drainage or dehiscence J-Tube inserted in the LUQ, also without erythema or  drainage.   Musculoskeletal: Normal range of motion. She exhibits no edema.  Neurological: She is alert.  Speech is clear and goal oriented Moves extremities without ataxia  Skin: Skin is warm and dry. She is not diaphoretic.  Psychiatric: She has a normal mood and affect.  Nursing note and vitals reviewed.   ED Course  Procedures (including critical care time) Labs Review Labs Reviewed - No data to display  Imaging Review Dg Abd 1 View  09/14/2014   CLINICAL DATA:  63 year old female with jejunostomy tube not functioning.  EXAM: ABDOMEN - 1 VIEW  COMPARISON:  09/14/2014  FINDINGS: 50 cc of Omnipaque 300 was injected into the patient's jejunostomy tube.  Contrast within the jejunum is noted.  There is no evidence of extraluminal contrast.  IMPRESSION: Jejunostomy tube now patent with contrast identified in the jejunum. No evidence of extraluminal contrast.   Electronically Signed   By: Hassan Rowan M.D.   On: 09/14/2014 17:49   Dg Abd 1 View  09/14/2014   CLINICAL DATA:  Jejunostomy tube placement, probable tube obstruction, unable to push contrast material through the tube  EXAM: ABDOMEN - 1 VIEW  COMPARISON:  09/01/2014  FINDINGS: Again noted a left percutaneous jejunostomy tube. Contrast material is noted within tube. However there is no spillage of the contrast into the jejunum. Tiny filling defect is noted at the tip of the tube consistent with obstruction.  IMPRESSION: There is obstruction of the jejunostomy tube with no spillage of contrast material within jejunum. Tiny endoluminal filling defect is noted at the tip of the tube.   Electronically Signed   By: Lahoma Crocker M.D.   On: 09/14/2014 16:52     EKG Interpretation None      MDM   Final diagnoses:  History of jejunostomy tube placement  Malfunctioning jejunostomy tube   Sabrina Melton presents with J-tube clogged.  Will obtain KUB to ensure position and have RN attempt to flush as well.     RN unable to flush and  radiology unable to push contrast.  KUB with Tiny endoluminal filling defect is noted at the tip of the tube.    5:25PM Dr. Colin Rhein has evaluated the patient and was able to unclog the tube.  Will reverify placement.  6:16 PM KUB with Jejunostomy tube now patent with contrast identified in the jejunum.  No evidence of extraluminal contrast.  Pt will be given home dosage of her morphine and will be d/c home.    I have personally reviewed patient's vitals, nursing note and any pertinent labs or imaging.  I performed an undressed physical exam.  It has been determined that no acute conditions requiring further emergency intervention are present at this time. The patient/guardian have been advised of the diagnosis and plan. I reviewed all labs and imaging including any potential incidental findings. We have discussed signs and symptoms that warrant return to the ED and they are listed in the discharge instructions.    Vital signs are stable at discharge.   BP 118/68 mmHg  Pulse 102  Temp(Src) 98.2 F (36.8 C) (Oral)  Resp 16  SpO2 94%   The patient was discussed with and seen by Dr. Colin Rhein who agrees with the treatment plan.   Jarrett Soho Rajohn Henery, PA-C 09/14/14 1817  Debby Freiberg, MD 09/18/14 [phone]

## 2014-09-14 NOTE — ED Notes (Signed)
Patient's husband called nurse from home health and was advised to try several things such as warm water, coca cola and nothing worked. Tube was just placed on 08/27/14.

## 2014-09-14 NOTE — ED Notes (Signed)
Pt reports her J tube has been clogged since 1030 this a.m. And was told by oncology to come here.

## 2014-09-14 NOTE — Discharge Instructions (Signed)
1. Medications: usual home medications 2. Treatment: rest, drink plenty of fluids,  3. Follow Up: Please followup with your primary doctor in as needed days for discussion of your diagnoses and further evaluation after today's visit; if you do not have a primary care doctor use the resource guide provided to find one; Please return to the ER for worsening symptoms or concerns

## 2014-09-15 ENCOUNTER — Other Ambulatory Visit: Payer: Self-pay | Admitting: *Deleted

## 2014-09-15 DIAGNOSIS — C169 Malignant neoplasm of stomach, unspecified: Secondary | ICD-10-CM

## 2014-09-15 MED ORDER — ONDANSETRON HCL 4 MG/5ML PO SOLN: 4.0000 mg | Freq: Three times a day (TID) | ORAL | Status: DC | PRN

## 2014-09-15 MED ORDER — RANITIDINE HCL 150 MG/10ML PO SYRP: 150.0000 mg | ORAL_SOLUTION | Freq: Two times a day (BID) | ORAL | Status: DC

## 2014-09-15 NOTE — Telephone Encounter (Signed)
Received call from pt's husband stating that pt had trouble with j-tube getting clogged & went to ED & is is now patent.  He is concerned about some of her meds that are suspensions & would like to get in liquid form b/c he thinks that is want stopped up her j-tube.  He ask if the protonix came in a liq form.  Called pt's pharmacy & it does not.  Zantac was suggested & Dr Burr Medico approved to take 150 bid.  She also needs a refill on her zofran & Dr. Burr Medico suggested ODT.  These will be sent to her pharmacy & pt notified of changes. Also received phone message from [facility] RN stating that she could not get pt's O2 Sat above 88 & she is SOB although not worse than usual.  She reports that her lungs are clear but wants an order for O2.  Discussed with Dr. Burr Medico & order given for O2 .  Tiffany will take care of order.  She can be reached at [phone].

## 2014-09-16 ENCOUNTER — Other Ambulatory Visit: Payer: Self-pay | Admitting: *Deleted

## 2014-09-16 ENCOUNTER — Telehealth: Payer: Self-pay | Admitting: *Deleted

## 2014-09-16 ENCOUNTER — Telehealth: Payer: Self-pay | Admitting: Hematology

## 2014-09-16 ENCOUNTER — Telehealth: Payer: Self-pay | Admitting: Internal Medicine

## 2014-09-16 DIAGNOSIS — C169 Malignant neoplasm of stomach, unspecified: Secondary | ICD-10-CM

## 2014-09-16 NOTE — Telephone Encounter (Signed)
Spoke with patient's husband. He is requesting a liquid form of cardizem or an alternative to cardizem in a liquid form. Patient has a jejunostomy tube.  It was clogged by the beads of the long acting cardizem capsule.   The ED was able to unclog it, but to prevent any other potential clogs, he prefers all of her medications to be in liquid form.  Mr. Sieler is aware that I will dicuss with the Elberta Leatherwood, PharmD, and with Dr. Harrington Challenger and will be contacting him with any new recommendations. He is very appreciative of any help we can provide.

## 2014-09-16 NOTE — Telephone Encounter (Signed)
Called pt at home and spoke with husband Collier Salina.  Theodosia Blender following instructions as per Dr. Burr Medico: 1.   Go to admitting at [facility]  09/17/14 to register for Bilateral Doppler LEs to be done at 0900. 2.   Pt to see Dr. Burr Medico at  1100  09/17/14. Collier Salina voiced understanding.

## 2014-09-16 NOTE — Telephone Encounter (Signed)
New problem    Pt was told to open current heart medication and put in J Tube and it stopped it up. Pt ended up in ER b/c of this. Pt need liquid medication. Please advise pt.

## 2014-09-16 NOTE — Telephone Encounter (Signed)
moved f/u from 12/3 to 12/2 @ 11am per thu. per thu appt should be 12/2 not 12/3 as requested per 12/3 pof. per thu pt husband aware of appt d/t.

## 2014-09-16 NOTE — Telephone Encounter (Signed)
Can go on cardiazem 40 mg elixir tid

## 2014-09-16 NOTE — Telephone Encounter (Signed)
Received call from [place], [place] at [facility] re:  Pt has had 1+ edema in both feet since last hospitalization.  Despite supportive measures of elevating legs, the feet swelling still not resolved.  Per Tiffany, pt does have occasional short of breath, but lung sounds clear.  Both husband and Jonelle Sidle think the cause of swelling might be from deconditioning issue.  Husband requested diuretics to help with pt's feet swelling.  Message to Dr. Burr Medico for review. Tiffany's  Phone    [phone].

## 2014-09-17 ENCOUNTER — Other Ambulatory Visit: Payer: Self-pay | Admitting: Internal Medicine

## 2014-09-17 ENCOUNTER — Telehealth: Payer: Self-pay | Admitting: Hematology

## 2014-09-17 ENCOUNTER — Ambulatory Visit (HOSPITAL_COMMUNITY)
Admission: RE | Admit: 2014-09-17 | Discharge: 2014-09-17 | Disposition: A | Discharge status: Home / Self Care | Payer: BC Managed Care – PPO | Source: Ambulatory Visit | Attending: Hematology | Admitting: Hematology

## 2014-09-17 ENCOUNTER — Ambulatory Visit (HOSPITAL_BASED_OUTPATIENT_CLINIC_OR_DEPARTMENT_OTHER): Payer: BC Managed Care – PPO | Admitting: Hematology

## 2014-09-17 ENCOUNTER — Telehealth: Payer: Self-pay | Admitting: *Deleted

## 2014-09-17 ENCOUNTER — Other Ambulatory Visit: Payer: Self-pay | Admitting: *Deleted

## 2014-09-17 VITALS — BP 130/54 | HR 118 | Temp 97.7°F | Resp 18 | Ht 66.0 in | Wt 201.7 lb

## 2014-09-17 DIAGNOSIS — I509 Heart failure, unspecified: Secondary | ICD-10-CM | POA: Diagnosis not present

## 2014-09-17 DIAGNOSIS — I82409 Acute embolism and thrombosis of unspecified deep veins of unspecified lower extremity: Secondary | ICD-10-CM | POA: Insufficient documentation

## 2014-09-17 DIAGNOSIS — E46 Unspecified protein-calorie malnutrition: Secondary | ICD-10-CM

## 2014-09-17 DIAGNOSIS — R0602 Shortness of breath: Secondary | ICD-10-CM

## 2014-09-17 DIAGNOSIS — C169 Malignant neoplasm of stomach, unspecified: Secondary | ICD-10-CM

## 2014-09-17 DIAGNOSIS — I824Z1 Acute embolism and thrombosis of unspecified deep veins of right distal lower extremity: Secondary | ICD-10-CM

## 2014-09-17 DIAGNOSIS — R600 Localized edema: Secondary | ICD-10-CM | POA: Insufficient documentation

## 2014-09-17 DIAGNOSIS — R110 Nausea: Secondary | ICD-10-CM

## 2014-09-17 DIAGNOSIS — J918 Pleural effusion in other conditions classified elsewhere: Secondary | ICD-10-CM

## 2014-09-17 DIAGNOSIS — C786 Secondary malignant neoplasm of retroperitoneum and peritoneum: Secondary | ICD-10-CM

## 2014-09-17 DIAGNOSIS — G893 Neoplasm related pain (acute) (chronic): Secondary | ICD-10-CM

## 2014-09-17 DIAGNOSIS — I82401 Acute embolism and thrombosis of unspecified deep veins of right lower extremity: Secondary | ICD-10-CM

## 2014-09-17 MED ORDER — ENOXAPARIN SODIUM 100 MG/ML ~~LOC~~ SOLN: 100.0000 mg | SUBCUTANEOUS | Status: DC

## 2014-09-17 MED ORDER — DILTIAZEM 12 MG/ML ORAL SUSPENSION: 40.0000 mg | Freq: Three times a day (TID) | ORAL | Status: DC

## 2014-09-17 MED ORDER — ENOXAPARIN SODIUM 100 MG/ML ~~LOC~~ SOLN
90.0000 mg | Freq: Two times a day (BID) | SUBCUTANEOUS | Status: DC
Start: 2014-09-17 — End: 2014-09-21

## 2014-09-17 MED ORDER — MORPHINE SULFATE (CONCENTRATE) 10 MG/0.5ML PO SOLN: 10.0000 mg | ORAL | Status: DC | PRN

## 2014-09-17 MED ORDER — ENOXAPARIN SODIUM 100 MG/ML ~~LOC~~ SOLN
90.0000 mg | Freq: Once | SUBCUTANEOUS | Status: AC
  Administered 2014-09-17: 90 mg via SUBCUTANEOUS
  Filled 2014-09-17: qty 1

## 2014-09-17 NOTE — Telephone Encounter (Signed)
[facility]ed [facility].  They do not carry liquid cardizem. [place] to compound -- --[phone]. Called them, left message to call me back.  Not open yet.

## 2014-09-17 NOTE — Progress Notes (Signed)
[place] OFFICE PROGRESS NOTE  Sabrina Melton Care Team: Lucretia Kern, DO as PCP - General (Family Medicine) Truitt Merle, MD as Consulting Physician (Hematology)  SUMMARY OF ONCOLOGIC HISTORY:   Gastric cancer, Stage IV with peritoneal metastases    08/06/2014 Imaging CT abdomen and pevis with IV contrast: abnormal wall thickening in the diatal esophagus extending into the stomach antrum,and entired gastric wall looks thickened. Gastrohepatic ligament node 0.cm, no other enlarged nodes or evidence of metastasis.    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 (+) pyloric obstruction, s/p ballon dilatation, (+) achalasia, s/p botox injection    08/26/2014 Initial Diagnosis Gastric cancer   08/27/2014 Pathologic Stage lapascopic staging and biopsy (+) peritoneal carcinomatosis    08/27/2014 Procedure J tube placement     INTERVAL HISTORY:  Sabrina Melton returns for follow-up. Sabrina Melton was admitted to [facility] from my office on 08/26/2014. Sabrina Melton underwent laparoscopic staging and J-tube placement on 08/27/2014. Unfortunately peritoneum biopsy showed metastatic adenocarcinoma. Sabrina Melton was started with tube feeding in the hospital, had a prolonged course due to pain control, postop ileum, and complications of pneumonia. Sabrina Melton was finally discharged home on 09/09/2014. Sabrina Melton reports Sabrina Melton has progressive worsening of dyspnea on exertion, Sabrina Melton is only able to walk from short distance such as bedroom to bathroom. Sabrina Melton home care nurse noticed hypoxia with pulse ox in mid 80s, and home oxygen has been requested and of waiting for insurance approval. Sabrina Melton is tolerating tube feeding well, Sabrina Melton did have a episodes of tube clotted due to medication, resolved  after saline push in the ED. Sabrina Melton medications have been changed to liquid form. Sabrina Melton is taking morphine 4 times daily, which seems to control Sabrina Melton pain well.  Sabrina Melton also reports bilateral leg swollen since Sabrina Melton hospital discharge.    REVIEW OF SYSTEMS:   Constitutional: Denies fevers, chills. (+) Weight loss Eyes: Denies blurriness of vision Ears, nose, mouth, throat, and face: Denies mucositis or sore throat Respiratory: Denies cough, or wheezes, (+) dyspnea on minimal exertion Cardiovascular: Denies palpitation, chest discomfort or lower extremity swelling Gastrointestinal: (+) Abdominal pain.  Denies nausea, heartburn or change in bowel habits Skin: Denies abnormal skin rashes Lymphatics: Denies new lymphadenopathy or easy bruising Neurological:Denies numbness, tingling or new weaknesses Behavioral/Psych: Mood is stable, no new changes  All other systems were reviewed with the Sabrina Melton and are negative.  MEDICAL HISTORY:  Past Medical History  Diagnosis Date  . 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 Hypothyroidism   . Obstructive sleep apnea 05/21/09    USES CPAP  . LIVER FUNCTION TESTS, ABNORMAL, HX OF 01/30/2009    Qualifier: Diagnosis of  By: Harrington Challenger, MD, [place] Flood   . PONV (postoperative nausea and vomiting)   . Hypertension   . Depression   . GERD (gastroesophageal reflux disease)   . Leiomyoma 2015    Multiple small on ultrasound, largest 24 mm  . Hodgkin disease     HISTORY OF  . Stomach cancer     SURGICAL HISTORY: Past Surgical History  Procedure Laterality Date  . Splenectomy, total  1977  . Tonsillectomy    . Open reduction and internal fixation of tibial shaft fracture with ext into the plafond  03/29/07    Angelena Form, M.D. [facility])  . Dilation and  curettage of uterus    . Colposcopy    . Gynecologic cryosurgery    . Thyroid lobectomy Left 03/14/2013    Dr Constance Holster  . Thyroidectomy N/A 03/14/2013    Procedure: LEFT THYROID LOBECTOMY WITH FROZEN SECTION;   Surgeon: Izora Gala, MD;  Location: [place];  Service: ENT;  Laterality: N/A;  . Hysteroscopy  2010    HYSTEROSCOPY,D&C FOR PMB AND ENDO POLYP  . Cesarean section  1979  . Laparoscopy N/A 08/27/2014    Procedure: Exploratory Laparoscopy,  abdominal biopsy and j tube placement;  Surgeon: Leighton Ruff, MD;  Location: [facility];  Service: General;  Laterality: N/A;    Sabrina Melton have reviewed the social history and family history with the Sabrina Melton and they are unchanged from previous note.  ALLERGIES:  has No Known Allergies.  MEDICATIONS:  Current Outpatient Prescriptions  Medication Sig Dispense Refill  . albuterol (PROVENTIL HFA;VENTOLIN HFA) 108 (90 BASE) MCG/ACT inhaler Inhale 2 puffs into the lungs every 6 (six) hours as needed for wheezing or shortness of breath. 1 Inhaler 2  . amoxicillin-clavulanate (AUGMENTIN) 400-57 MG/5ML suspension Place 10 mLs (800 mg total) into feeding tube every 12 (twelve) hours. For 7 more days. 140 mL 0  . diltiazem (CARDIZEM CD) 120 MG 24 hr capsule TAKE ONE CAPSULE BY MOUTH EVERY DAY *NEED DR APPOINTMENT* 15 capsule 0  . diltiazem (CARDIZEM) 10 mg/ml oral suspension Place 4 mLs (40 mg total) into feeding tube 3 (three) times daily. 360 mL 6  . diphenhydrAMINE (BENYLIN) 12.5 MG/5ML syrup 12.51m via tube every 6 hrs as needed for itching, rash 120 mL 0  . enoxaparin (LOVENOX) 100 MG/ML injection Inject 0.9 mLs (90 mg total) into the skin 2 (two) times daily. 60 Syringe 0  . levothyroxine (SYNTHROID, LEVOTHROID) 75 MCG tablet Take 1 tablet (75 mcg total) by mouth daily. Please dissolve completely in water prior to administering through J tube. 90 tablet 1  . LORazepam (ATIVAN) 0.5 MG tablet Place 1 tablet (0.5 mg total) under the tongue at bedtime as needed and may repeat dose one time if needed for anxiety. 30 tablet 0  . metoCLOPramide (REGLAN) 5 MG/5ML solution Place 10 mLs (10 mg total) into feeding tube 3 (three) times daily. 900 mL 1  . Morphine Sulfate (MORPHINE  CONCENTRATE) 10 MG/0.5ML SOLN concentrated solution Place 0.5 mLs (10 mg total) into feeding tube every 3 (three) hours as needed for severe pain. 100 mL 0  . neomycin-bacitracin-polymyxin (NEOSPORIN) OINT Apply 1 application topically 2 (two) times daily. Apply around the j tube insertion site till improved. 1 Tube 0  . Nutritional Supplements (FEEDING SUPPLEMENT, OSMOLITE 1.2 CAL,) LIQD Place 1,000 mLs into feeding tube daily. At 680mhr 1000 mL 10  . ondansetron (ZOFRAN) 4 MG/5ML solution Place 5 mLs (4 mg total) into feeding tube every 8 (eight) hours as needed for nausea or vomiting. 50 mL 1  . predniSONE (DELTASONE) 20 MG tablet Dissolve completely in water prior to administering via j tube. 3 tablets once daily for 2 days, then 2 tablets once daily for 4 days, then 1 tablet once daily for 4 days, then stop. 18 tablet 0  . promethazine (PHENERGAN) 25 MG suppository Place 1 suppository (25 mg total) rectally every 6 (six) hours as needed for nausea or vomiting. 60 each 2  . ranitidine (ZANTAC) 150 MG/10ML syrup Take 10 mLs (150 mg total) by mouth 2 (two) times daily. 300 mL 1  . Water For Irrigation, Sterile (FREE WATER) SOLN  Place 100 mLs into feeding tube every 8 (eight) hours. 1000 mL 2   No current facility-administered medications for this visit.    PHYSICAL EXAMINATION: ECOG PERFORMANCE STATUS: 3 - Symptomatic, >50% confined to bed  Filed Vitals:   09/17/14 1118  BP: 130/54  Pulse: 118  Temp: 97.7 F (36.5 C)  Resp: 18   Filed Weights   09/17/14 1118  Weight: 201 lb 11.2 oz (91.491 kg)    GENERAL:alert, no distress and comfortable SKIN: skin color, texture, turgor are normal, no rashes or significant lesions EYES: normal, Conjunctiva are pink and non-injected, sclera clear OROPHARYNX:no exudate, no erythema and lips, buccal mucosa, and tongue normal  NECK: supple, thyroid normal size, non-tender, without nodularity LYMPH:  no palpable lymphadenopathy in the cervical,  axillary or inguinal LUNGS: clear to auscultation and percussion with normal breathing effort HEART: regular rate & rhythm and no murmurs and no lower extremity edema ABDOMEN:abdomen soft, non-tender and normal bowel sounds Musculoskeletal:no cyanosis of digits and no clubbing  NEURO: alert & oriented x 3 with fluent speech, no focal motor/sensory deficits  LABORATORY DATA:  Sabrina Melton have reviewed the data as listed CBC Latest Ref Rng 09/08/2014 09/07/2014 09/06/2014  WBC 4.0 - 10.5 K/uL 15.2(H) 14.6(H) 18.5(H)  Hemoglobin 12.0 - 15.0 g/dL 10.4(L) 9.8(L) 9.8(L)  Hematocrit 36.0 - 46.0 % 31.8(L) 30.4(L) 30.6(L)  Platelets 150 - 400 K/uL 489(H) 435(H) 414(H)    CMP Latest Ref Rng 09/08/2014 09/07/2014 09/06/2014  Glucose 70 - 99 mg/dL 207(H) 152(H) 112(H)  BUN 6 - 23 mg/dL 8 10 14   Creatinine 0.50 - 1.10 mg/dL 0.69 0.70 0.92  Sodium 137 - 147 mEq/L 136(L) 136(L) 140  Potassium 3.7 - 5.3 mEq/L 4.0 3.7 4.4  Chloride 96 - 112 mEq/L 97 99 103  CO2 19 - 32 mEq/L 30 28 27   Calcium 8.4 - 10.5 mg/dL 8.6 7.9(L) 8.1(L)  Total Protein 6.0 - 8.3 g/dL 5.9(L) 5.2(L) 5.2(L)  Total Bilirubin 0.3 - 1.2 mg/dL 0.3 0.2(L) 0.3  Alkaline Phos 39 - 117 U/L 130(H) 117 130(H)  AST 0 - 37 U/L 18 23 56(H)  ALT 0 - 35 U/L 25 30 44(H)     RADIOGRAPHIC STUDIES: Sabrina Melton have personally reviewed the radiological images as listed and agreed with the findings in the report. CXR 09/17/2014 Increased volume of the bilateral pleural effusions in the setting of low-grade CHF. There is no focal pneumonia.  [place] of low extremities 09/17/2014 - Findings consistent with deep vein thrombosis involving the right peroneal vein. - No evidence of deep vein thrombosis involving the left lower extremity.  ASSESSMENT & PLAN:  63 yo female with PMH of HTN, hypothyroidism, OSA, depression, and history of stage IIB Hodgkin Lymphoma s/p mental filed radiation and chemotherapy in 1977, who presented with gastric fullness, nausea,  vomiting, poor po intake and 40 lb weight loss.   1. Gastric cancer with peritoneal metastasis -We reviewed Sabrina Melton peritoneum biopsy results. Unfortunately Sabrina Melton has metastatic disease and this is not curable. Sabrina Melton overall prognosis is very poor due to the aggressive nature of Sabrina Melton gastric cancer, our goal of treatment is palliation. -Due to Sabrina Melton multiple complications which are related to Sabrina Melton cancer, Sabrina Melton performance status is very poor. Sabrina Melton is not a good candidate for chemotherapy. -We'll check Sabrina Melton-2 expression in the tumor. The likelihood of Sabrina Melton-2 overexpression in diffuse signal ring gastric cancer is very low.  -Sabrina Melton is going to [facility] for second opinion tomorrow.  Burnis Medin reevaluate Sabrina Melton candidacy for chemotherapy  in a few weeks. Sabrina Melton and Sabrina Melton would like to try chemotherapy. -Sabrina Melton recommend a port placement for chemotherapy. Sabrina Melton cannot take capcitabine due to gastric obstruction.  2. Right lower extremity DVT, possible PE based on Sabrina Melton dyspnea symptom -Sabrina Melton pulse ox at rest was in mid 80s at room air during Sabrina Melton office visit, Sabrina Melton recommended hospital admission for anticoagulation and home oxygen arrangement. Sabrina Melton declined at this point due to Sabrina Melton tomorrow's appointment at [facility]. We called Sabrina Melton home care services and try to expedite Sabrina Melton home oxygen delivery -Sabrina Melton recommend Lovenox 90 mg subcutaneous every 12 hours. First dose was given in office today. Prescription was called into Sabrina Melton pharmacy.  3. Bilateral pleural effusion, right greater than the left  -This could be malignant or secondary to hypo-albuminemia -We'll arrange IR thoracentesis for symptom relief next week  4. Malnutrition secondary to cancer -Sabrina Melton is on J-tube feeding, however dietitian is following Sabrina Melton   5. Pain and nausea secondary to cancer -Continue morphine. It's currently uncontrolled.  Sabrina Melton'll see Sabrina Melton back next week.   Orders Placed This Encounter  Procedures  . For home use only DME oxygen    O2 sat 81 at rest, up  to 88 with deep breathing.    Standing Status: Standing     Number of Occurrences: 1     Standing Expiration Date:     Order Specific Question:  Mode or (Route)    Answer:  Nasal cannula    Order Specific Question:  Liters per Minute    Answer:  2    Order Specific Question:  Frequency    Answer:  Continuous (stationary and portable oxygen unit needed)    Order Specific Question:  Oxygen conserving device    Answer:  Yes    Order Specific Question:  Oxygen delivery system    Answer:  Gas  . DG Chest 2 View    Standing Status: Future     Number of Occurrences: 1     Standing Expiration Date: 09/17/2015    Order Specific Question:  Reason for Exam (SYMPTOM  OR DIAGNOSIS REQUIRED)    Answer:  dyspnea, evaluate pleural effusion    Order Specific Question:  Preferred imaging location?    Answer:  [facility]  . CBC with Differential    Standing Status: Future     Number of Occurrences:      Standing Expiration Date: 09/17/2015  . Comprehensive metabolic panel (Cmet) - [facility]    Standing Status: Future     Number of Occurrences:      Standing Expiration Date: 09/17/2015   All questions were answered. The Sabrina Melton knows to call the clinic with any problems, questions or concerns. No barriers to learning was detected.  Sabrina Melton spent 30 minutes counseling the Sabrina Melton face to face. The total time spent in the appointment was 40 minutes and more than 50% was on counseling and review of test results     Truitt Merle, MD 09/20/2014 5:44 PM

## 2014-09-17 NOTE — Telephone Encounter (Signed)
Spoke with [employer].  They are able to compound a suspension.  The estimated cost will be ~$50-$60 for a month.  Spoke with pt's husband.  He was okay with the cost.  Rx given to [facility]

## 2014-09-17 NOTE — Telephone Encounter (Signed)
LM to confirm appt for 09/24/14. Let pt know she needed to be added for lab 09/17/14.

## 2014-09-17 NOTE — Progress Notes (Signed)
Pt here for MD visit & SOB.  O2 Sat 81-83 % at rest & up to 88 with deep breathing.  O2 order placed with [facility] for home use.

## 2014-09-17 NOTE — Progress Notes (Signed)
VASCULAR LAB PRELIMINARY  PRELIMINARY  PRELIMINARY  PRELIMINARY  Bilateral lower extremity venous duplex  completed.    Preliminary report:  Right:  DVT noted in the peroneal vein.  All other veins patent  No evidence of superficial thrombosis.  No Baker's cyst.  Left:  No evidence of DVT, superficial thrombosis, or Baker's cyst.  Panagiotis Oelkers, RVT 09/17/2014, 9:17 AM

## 2014-09-17 NOTE — Telephone Encounter (Signed)
Jim with [facility] [phone]) called asking for clarificition of Lovenox.  "Sig doesn't match the medicine."  Will notify Dr. Burr Medico to contact pharmacy

## 2014-09-17 NOTE — Telephone Encounter (Signed)
Order for O2 2L/min continuous with oxygen conserving device faxed to [facility] @ [phone].

## 2014-09-18 ENCOUNTER — Ambulatory Visit: Payer: BC Managed Care – PPO | Admitting: Hematology

## 2014-09-19 ENCOUNTER — Ambulatory Visit: Payer: BC Managed Care – PPO | Admitting: Hematology

## 2014-09-20 ENCOUNTER — Encounter: Payer: Self-pay | Admitting: Hematology

## 2014-09-21 ENCOUNTER — Emergency Department (HOSPITAL_COMMUNITY): Payer: BC Managed Care – PPO

## 2014-09-21 ENCOUNTER — Inpatient Hospital Stay (HOSPITAL_COMMUNITY)
Admission: EM | Admit: 2014-09-21 | Discharge: 2014-10-17 | DRG: 374 | Disposition: E | Payer: BC Managed Care – PPO | Attending: Internal Medicine | Admitting: Internal Medicine

## 2014-09-21 ENCOUNTER — Encounter (HOSPITAL_COMMUNITY): Payer: Self-pay | Admitting: Emergency Medicine

## 2014-09-21 DIAGNOSIS — R0602 Shortness of breath: Secondary | ICD-10-CM | POA: Diagnosis present

## 2014-09-21 DIAGNOSIS — G8929 Other chronic pain: Secondary | ICD-10-CM | POA: Diagnosis present

## 2014-09-21 DIAGNOSIS — Z515 Encounter for palliative care: Secondary | ICD-10-CM

## 2014-09-21 DIAGNOSIS — K219 Gastro-esophageal reflux disease without esophagitis: Secondary | ICD-10-CM | POA: Diagnosis present

## 2014-09-21 DIAGNOSIS — I82401 Acute embolism and thrombosis of unspecified deep veins of right lower extremity: Secondary | ICD-10-CM | POA: Diagnosis present

## 2014-09-21 DIAGNOSIS — E861 Hypovolemia: Secondary | ICD-10-CM | POA: Diagnosis present

## 2014-09-21 DIAGNOSIS — I5031 Acute diastolic (congestive) heart failure: Secondary | ICD-10-CM | POA: Diagnosis present

## 2014-09-21 DIAGNOSIS — J9601 Acute respiratory failure with hypoxia: Secondary | ICD-10-CM | POA: Diagnosis present

## 2014-09-21 DIAGNOSIS — Z934 Other artificial openings of gastrointestinal tract status: Secondary | ICD-10-CM | POA: Diagnosis not present

## 2014-09-21 DIAGNOSIS — Z6832 Body mass index (BMI) 32.0-32.9, adult: Secondary | ICD-10-CM

## 2014-09-21 DIAGNOSIS — E871 Hypo-osmolality and hyponatremia: Secondary | ICD-10-CM | POA: Diagnosis present

## 2014-09-21 DIAGNOSIS — J910 Malignant pleural effusion: Secondary | ICD-10-CM | POA: Diagnosis present

## 2014-09-21 DIAGNOSIS — J449 Chronic obstructive pulmonary disease, unspecified: Secondary | ICD-10-CM | POA: Diagnosis present

## 2014-09-21 DIAGNOSIS — Z66 Do not resuscitate: Secondary | ICD-10-CM | POA: Diagnosis present

## 2014-09-21 DIAGNOSIS — F09 Unspecified mental disorder due to known physiological condition: Secondary | ICD-10-CM | POA: Diagnosis present

## 2014-09-21 DIAGNOSIS — E039 Hypothyroidism, unspecified: Secondary | ICD-10-CM | POA: Diagnosis present

## 2014-09-21 DIAGNOSIS — D72829 Elevated white blood cell count, unspecified: Secondary | ICD-10-CM | POA: Diagnosis present

## 2014-09-21 DIAGNOSIS — J9602 Acute respiratory failure with hypercapnia: Secondary | ICD-10-CM | POA: Diagnosis present

## 2014-09-21 DIAGNOSIS — E43 Unspecified severe protein-calorie malnutrition: Secondary | ICD-10-CM | POA: Diagnosis present

## 2014-09-21 DIAGNOSIS — Z8571 Personal history of Hodgkin lymphoma: Secondary | ICD-10-CM

## 2014-09-21 DIAGNOSIS — J810 Acute pulmonary edema: Secondary | ICD-10-CM

## 2014-09-21 DIAGNOSIS — Z9981 Dependence on supplemental oxygen: Secondary | ICD-10-CM

## 2014-09-21 DIAGNOSIS — I82409 Acute embolism and thrombosis of unspecified deep veins of unspecified lower extremity: Secondary | ICD-10-CM

## 2014-09-21 DIAGNOSIS — T380X5A Adverse effect of glucocorticoids and synthetic analogues, initial encounter: Secondary | ICD-10-CM | POA: Diagnosis present

## 2014-09-21 DIAGNOSIS — G4733 Obstructive sleep apnea (adult) (pediatric): Secondary | ICD-10-CM | POA: Diagnosis present

## 2014-09-21 DIAGNOSIS — C169 Malignant neoplasm of stomach, unspecified: Principal | ICD-10-CM | POA: Diagnosis present

## 2014-09-21 DIAGNOSIS — Z87891 Personal history of nicotine dependence: Secondary | ICD-10-CM

## 2014-09-21 DIAGNOSIS — J90 Pleural effusion, not elsewhere classified: Secondary | ICD-10-CM

## 2014-09-21 DIAGNOSIS — F329 Major depressive disorder, single episode, unspecified: Secondary | ICD-10-CM | POA: Diagnosis present

## 2014-09-21 DIAGNOSIS — C786 Secondary malignant neoplasm of retroperitoneum and peritoneum: Secondary | ICD-10-CM | POA: Diagnosis present

## 2014-09-21 DIAGNOSIS — J811 Chronic pulmonary edema: Secondary | ICD-10-CM | POA: Diagnosis present

## 2014-09-21 DIAGNOSIS — R0600 Dyspnea, unspecified: Secondary | ICD-10-CM | POA: Diagnosis present

## 2014-09-21 DIAGNOSIS — F419 Anxiety disorder, unspecified: Secondary | ICD-10-CM | POA: Diagnosis present

## 2014-09-21 DIAGNOSIS — I10 Essential (primary) hypertension: Secondary | ICD-10-CM | POA: Diagnosis present

## 2014-09-21 LAB — COMPREHENSIVE METABOLIC PANEL
ALBUMIN: 2.4 g/dL — AB (ref 3.5–5.2)
ALT: 21 U/L (ref 0–35)
AST: 18 U/L (ref 0–37)
Alkaline Phosphatase: 139 U/L — ABNORMAL HIGH (ref 39–117)
Anion gap: 12 (ref 5–15)
BUN: 18 mg/dL (ref 6–23)
CALCIUM: 8.9 mg/dL (ref 8.4–10.5)
CO2: 30 mEq/L (ref 19–32)
CREATININE: 0.73 mg/dL (ref 0.50–1.10)
Chloride: 90 mEq/L — ABNORMAL LOW (ref 96–112)
GFR calc Af Amer: >90 mL/min (ref >90)
GFR calc non Af Amer: 90 mL/min — ABNORMAL LOW (ref >90)
Glucose, Bld: 175 mg/dL — ABNORMAL HIGH (ref 70–99)
Potassium: 4.7 mEq/L (ref 3.7–5.3)
Sodium: 132 mEq/L — ABNORMAL LOW (ref 137–147)
TOTAL PROTEIN: 6.1 g/dL (ref 6.0–8.3)
Total Bilirubin: 0.4 mg/dL (ref 0.3–1.2)

## 2014-09-21 LAB — URINALYSIS, ROUTINE W REFLEX MICROSCOPIC
Glucose, UA: NEGATIVE mg/dL
Ketones, ur: NEGATIVE mg/dL
NITRITE: NEGATIVE
Protein, ur: 30 mg/dL — AB
SPECIFIC GRAVITY, URINE: 1.023 (ref 1.005–1.030)
UROBILINOGEN UA: 0.2 mg/dL (ref 0.0–1.0)
pH: 5.5 (ref 5.0–8.0)

## 2014-09-21 LAB — CBC WITH DIFFERENTIAL/PLATELET
BASOS ABS: 0 10*3/uL (ref 0.0–0.1)
BASOS PCT: 0 % (ref 0–1)
EOS PCT: 0 % (ref 0–5)
Eosinophils Absolute: 0.1 10*3/uL (ref 0.0–0.7)
HCT: 33.6 % — ABNORMAL LOW (ref 36.0–46.0)
Hemoglobin: 10.5 g/dL — ABNORMAL LOW (ref 12.0–15.0)
Lymphocytes Relative: 2 % — ABNORMAL LOW (ref 12–46)
Lymphs Abs: 0.5 10*3/uL — ABNORMAL LOW (ref 0.7–4.0)
MCH: 31.8 pg (ref 26.0–34.0)
MCHC: 31.3 g/dL (ref 30.0–36.0)
MCV: 101.8 fL — AB (ref 78.0–100.0)
MONO ABS: 0.4 10*3/uL (ref 0.1–1.0)
Monocytes Relative: 2 % — ABNORMAL LOW (ref 3–12)
Neutro Abs: 21 10*3/uL — ABNORMAL HIGH (ref 1.7–7.7)
Neutrophils Relative %: 96 % — ABNORMAL HIGH (ref 43–77)
PLATELETS: 429 10*3/uL — AB (ref 150–400)
RBC: 3.3 MIL/uL — ABNORMAL LOW (ref 3.87–5.11)
RDW: 14.4 % (ref 11.5–15.5)
WBC: 22.1 10*3/uL — ABNORMAL HIGH (ref 4.0–10.5)

## 2014-09-21 LAB — URINE MICROSCOPIC-ADD ON

## 2014-09-21 LAB — I-STAT CG4 LACTIC ACID, ED: LACTIC ACID, VENOUS: 1.22 mmol/L (ref 0.5–2.2)

## 2014-09-21 LAB — PRO B NATRIURETIC PEPTIDE: PRO B NATRI PEPTIDE: 883.1 pg/mL — AB (ref 0–125)

## 2014-09-21 LAB — TROPONIN I

## 2014-09-21 LAB — LIPASE, BLOOD: LIPASE: 24 U/L (ref 11–59)

## 2014-09-21 LAB — TSH: TSH: 24.29 u[IU]/mL — ABNORMAL HIGH (ref 0.350–4.500)

## 2014-09-21 MED ORDER — OSMOLITE 1.2 CAL PO LIQD
1000.0000 mL | ORAL | Status: DC
  Administered 2014-09-21 – 2014-09-23 (×4): 1000 mL
  Filled 2014-09-21 (×5): qty 1000

## 2014-09-21 MED ORDER — ONDANSETRON HCL 4 MG PO TABS: 4.0000 mg | ORAL_TABLET | Freq: Four times a day (QID) | ORAL | Status: DC | PRN

## 2014-09-21 MED ORDER — SODIUM CHLORIDE 0.9 % IJ SOLN: 3.0000 mL | INTRAMUSCULAR | Status: DC | PRN

## 2014-09-21 MED ORDER — LORAZEPAM 0.5 MG PO TABS
0.5000 mg | ORAL_TABLET | Freq: Every evening | ORAL | Status: DC | PRN
Start: 2014-09-21 — End: 2014-09-23

## 2014-09-21 MED ORDER — BACITRACIN-NEOMYCIN-POLYMYXIN 400-5-5000 EX OINT
1.0000 "application " | TOPICAL_OINTMENT | Freq: Two times a day (BID) | CUTANEOUS | Status: DC
  Administered 2014-09-21 – 2014-09-24 (×6): 1 via TOPICAL
  Filled 2014-09-21 (×2): qty 1

## 2014-09-21 MED ORDER — RANITIDINE HCL 150 MG/10ML PO SYRP
150.0000 mg | ORAL_SOLUTION | Freq: Two times a day (BID) | ORAL | Status: DC
  Administered 2014-09-21 – 2014-09-24 (×7): 150 mg
  Filled 2014-09-21 (×9): qty 10

## 2014-09-21 MED ORDER — OSMOLITE 1.2 CAL PO LIQD: 1000.0000 mL | ORAL | Status: DC

## 2014-09-21 MED ORDER — DILTIAZEM 12 MG/ML ORAL SUSPENSION
40.0000 mg | Freq: Three times a day (TID) | ORAL | Status: DC
  Administered 2014-09-21 – 2014-09-24 (×11): 40 mg via ORAL
  Filled 2014-09-21 (×16): qty 6

## 2014-09-21 MED ORDER — ALBUTEROL SULFATE (2.5 MG/3ML) 0.083% IN NEBU: 2.5000 mg | INHALATION_SOLUTION | RESPIRATORY_TRACT | Status: DC | PRN

## 2014-09-21 MED ORDER — ONDANSETRON HCL 4 MG/2ML IJ SOLN
4.0000 mg | Freq: Four times a day (QID) | INTRAMUSCULAR | Status: DC | PRN
  Administered 2014-09-22: 4 mg via INTRAVENOUS
  Filled 2014-09-21: qty 2

## 2014-09-21 MED ORDER — FREE WATER
100.0000 mL | Freq: Three times a day (TID) | Status: DC
  Administered 2014-09-21 – 2014-09-23 (×5): 100 mL

## 2014-09-21 MED ORDER — FUROSEMIDE 10 MG/ML IJ SOLN
40.0000 mg | Freq: Once | INTRAMUSCULAR | Status: AC
  Administered 2014-09-21: 40 mg via INTRAVENOUS
  Filled 2014-09-21: qty 4

## 2014-09-21 MED ORDER — SODIUM CHLORIDE 0.9 % IJ SOLN
3.0000 mL | Freq: Two times a day (BID) | INTRAMUSCULAR | Status: DC
  Administered 2014-09-24 (×2): 3 mL via INTRAVENOUS

## 2014-09-21 MED ORDER — METOCLOPRAMIDE HCL 10 MG/10ML PO SOLN
10.0000 mg | Freq: Three times a day (TID) | ORAL | Status: DC
  Administered 2014-09-21 – 2014-09-24 (×10): 10 mg
  Filled 2014-09-21 (×14): qty 10

## 2014-09-21 MED ORDER — FUROSEMIDE 10 MG/ML IJ SOLN
20.0000 mg | Freq: Two times a day (BID) | INTRAMUSCULAR | Status: DC
  Administered 2014-09-21 – 2014-09-23 (×4): 20 mg via INTRAVENOUS
  Filled 2014-09-21 (×4): qty 2

## 2014-09-21 MED ORDER — ENOXAPARIN SODIUM 100 MG/ML ~~LOC~~ SOLN
1.0000 mg/kg | Freq: Two times a day (BID) | SUBCUTANEOUS | Status: DC
  Filled 2014-09-21: qty 1

## 2014-09-21 MED ORDER — ENOXAPARIN SODIUM 100 MG/ML ~~LOC~~ SOLN
1.0000 mg/kg | Freq: Two times a day (BID) | SUBCUTANEOUS | Status: DC
  Administered 2014-09-21 – 2014-09-23 (×4): 90 mg via SUBCUTANEOUS
  Filled 2014-09-21 (×4): qty 1

## 2014-09-21 MED ORDER — ACETAMINOPHEN 650 MG RE SUPP
650.0000 mg | Freq: Four times a day (QID) | RECTAL | Status: DC | PRN
  Administered 2014-09-22 – 2014-09-23 (×4): 650 mg via RECTAL
  Filled 2014-09-21 (×4): qty 1

## 2014-09-21 MED ORDER — MORPHINE SULFATE 2 MG/ML IJ SOLN
2.0000 mg | INTRAMUSCULAR | Status: DC | PRN
  Administered 2014-09-21 – 2014-09-23 (×6): 2 mg via INTRAVENOUS
  Filled 2014-09-21 (×6): qty 1

## 2014-09-21 MED ORDER — SODIUM CHLORIDE 0.9 % IV SOLN: 250.0000 mL | INTRAVENOUS | Status: DC | PRN

## 2014-09-21 MED ORDER — ACETAMINOPHEN 325 MG PO TABS
650.0000 mg | ORAL_TABLET | Freq: Four times a day (QID) | ORAL | Status: DC | PRN
  Filled 2014-09-21: qty 2

## 2014-09-21 MED ORDER — LEVOTHYROXINE SODIUM 75 MCG PO TABS
75.0000 ug | ORAL_TABLET | Freq: Every day | ORAL | Status: DC
  Administered 2014-09-22: 75 ug via ORAL
  Filled 2014-09-21: qty 1

## 2014-09-21 NOTE — Plan of Care (Signed)
Problem: Phase II Progression Outcomes Goal: Vital signs remain stable Outcome: Completed/Met Date Met:  09/19/2014     

## 2014-09-21 NOTE — H&P (Signed)
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 Patient was admitted for J-tube placement when the biopsy from the diaphragm revealed metastasis to the peritoneum. Patient was discharged from the hospital on 09/09/2014 with antibiotics and prednisone taper. Patient completed her prednisone taper yesterday along with Augmentin. Patient and her husband also went to [facility] for a second opinion, where they were told to follow oncology at [facility]. Patient also was recently diagnosed with DVT of the right peroneal vein and started on anticoagulation with therapeutic Lovenox. Over the past few weeks, patient has been getting progressively short of breath. She had home oxygen, which was prescribed during the previous admission. As per patient she has been getting short of breath on exertion, she feels tight on the chest, has been coughing up clear phlegm. Denies any chest pain. No nausea vomiting or diarrhea. She denies fever no dysuria urgency frequency of urination. Patient has been getting the Osmolite through the J-tube.  In the ED patient was found to have pulmonary edema as BNP was elevated to 883.1, chest x-ray revealed moderate pleural effusions and perihilar space opacities indicating progressive pulmonary edema.   Allergies:  No Known Allergies    Past Medical History  Diagnosis Date  . 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 Hypothyroidism   . Obstructive sleep apnea 05/21/09    USES CPAP  . LIVER FUNCTION TESTS, ABNORMAL, HX OF 01/30/2009    Qualifier: Diagnosis of  By: Harrington Challenger, MD, Shade Flood   . PONV (postoperative nausea and vomiting)   . Hypertension   . Depression   . GERD (gastroesophageal reflux  disease)   . Leiomyoma 2015    Multiple small on ultrasound, largest 24 mm  . Hodgkin disease     HISTORY OF  . Stomach cancer     Past Surgical History  Procedure Laterality Date  . Splenectomy, total  1977  . Tonsillectomy    . Open reduction and internal fixation of tibial shaft fracture with ext into the plafond  03/29/07    Angelena Form, M.D. [facility])  . Dilation and curettage of uterus    . Colposcopy    . Gynecologic cryosurgery    . Thyroid lobectomy Left 03/14/2013    Dr Constance Holster  . Thyroidectomy N/A 03/14/2013    Procedure: LEFT THYROID LOBECTOMY WITH FROZEN SECTION;  Surgeon: Izora Gala, MD;  Location: [place];  Service: ENT;  Laterality: N/A;  . Hysteroscopy  2010    HYSTEROSCOPY,D&C FOR PMB AND ENDO POLYP  . Cesarean section  1979  . Laparoscopy N/A 08/27/2014    Procedure: Exploratory Laparoscopy,  abdominal biopsy and j tube placement;  Surgeon: Leighton Ruff, MD;  Location: [facility];  Service: General;  Laterality: N/A;    Prior to Admission medications   Medication Sig Start Date End Date Taking? Authorizing Provider  albuterol (PROVENTIL HFA;VENTOLIN HFA) 108 (90 BASE) MCG/ACT inhaler Inhale 2 puffs into the lungs every 4 (four) hours as needed for wheezing or shortness of breath.   Yes Historical Provider, MD  diltiazem (CARDIZEM) 10 mg/ml oral suspension Take 40 mg by mouth 3 (three) times daily.   Yes Historical Provider, MD  diphenhydrAMINE (BENADRYL) 12.5 MG/5ML liquid Take 12.5 mg by mouth 4 (four) times daily  as needed for itching or allergies.   Yes Historical Provider, MD  enoxaparin (LOVENOX) 100 MG/ML injection Inject 90 mg into the skin daily.   Yes Historical Provider, MD  levothyroxine (SYNTHROID, LEVOTHROID) 75 MCG tablet Take 75 mcg by mouth daily before breakfast. Dissolve completely in water before administering through j-tube   Yes Historical Provider, MD  LORazepam (ATIVAN) 0.5 MG tablet Take 0.5 mg by mouth at bedtime as needed and may repeat dose  one time if needed for anxiety.   Yes Historical Provider, MD  metoCLOPramide (REGLAN) 10 MG/10ML SOLN Take 10 mg by mouth 3 (three) times daily.   Yes Historical Provider, MD  morphine (ROXANOL) 20 MG/ML concentrated solution Take 10 mg by mouth every 3 (three) hours as needed for severe pain.   Yes Historical Provider, MD  neomycin-bacitracin-polymyxin (NEOSPORIN) ointment Apply 1 application topically 2 (two) times daily. Apply to j-tube area   Yes Historical Provider, MD  Nutritional Supplements (FEEDING SUPPLEMENT, OSMOLITE 1.2 CAL,) LIQD Place 1,000 mLs into feeding tube continuous. 37ml /hr until patient states she is full   Yes Historical Provider, MD  ondansetron (ZOFRAN) 4 MG/5ML solution Take 4 mg by mouth every 8 (eight) hours as needed for nausea or vomiting.   Yes Historical Provider, MD  predniSONE (DELTASONE) 20 MG tablet 20-60 mg by Per J Tube route daily with breakfast. Take 3 tablets once daily for 2 days, then 2 tablets once daily for 4 days , then 1 tablet once daily for 4 days. Dissolve in water before administrating 09/13/14 09/23/14 Yes Historical Provider, MD  promethazine (PHENERGAN) 25 MG suppository Place 25 mg rectally every 6 (six) hours as needed for nausea or vomiting.   Yes Historical Provider, MD  ranitidine (ZANTAC) 150 MG/10ML syrup Take 150 mg by mouth 2 (two) times daily.   Yes Historical Provider, MD  Water For Irrigation, Sterile (FREE WATER) SOLN Place 100 mLs into feeding tube every 8 (eight) hours. 09/09/14  Yes Bonnielee Haff, MD  amoxicillin-clavulanate (AUGMENTIN) 400-57 MG/5ML suspension Place 10 mLs (800 mg total) into feeding tube every 12 (twelve) hours. For 7 more days. 09/09/14   Bonnielee Haff, MD    Social History:  reports that she quit smoking about 7 years ago. Her smoking use included Cigarettes. She has a 10 pack-year smoking history. She has never used smokeless tobacco. She reports that she drinks alcohol. She reports that she does not use  illicit drugs.  Family History  Problem Relation Age of Onset  . Breast cancer Maternal Grandmother   . Hyperlipidemia Father      All the positives are listed in BOLD  Review of Systems:  HEENT: Headache, blurred vision, runny nose, sore throat Neck: Hypothyroidism, hyperthyroidism,,lymphadenopathy Chest : Shortness of breath, history of COPD, Asthma Heart : Chest pain, history of coronary arterey disease GI:  Nausea, vomiting, diarrhea, constipation, GERD GU: Dysuria, urgency, frequency of urination, hematuria Neuro: Stroke, seizures, syncope Psych: Depression, anxiety, hallucinations   Physical Exam: Blood pressure 115/75, pulse 98, temperature 98.4 F (36.9 C), temperature source Oral, resp. rate 13, SpO2 94 %. Constitutional:   Patient is a well-developed and well-nourished *female in moderate respiratory distress and cooperative with exam. Head: Normocephalic and atraumatic Mouth: Mucus membranes moist Eyes: PERRL, EOMI, conjunctivae normal Neck: Supple, No Thyromegaly Cardiovascular: RRR, S1 normal, S2 normal Pulmonary/Chest: Decreased breath sounds bilaterally, scattered wheezing Abdominal: Soft. Non-tender, non-distended, bowel sounds are normal, no masses, organomegaly, or guarding present.  Neurological: A&O x3, Strength is normal  and symmetric bilaterally, cranial nerve II-XII are grossly intact, no focal motor deficit, sensory intact to light touch bilaterally.  Extremities : No Cyanosis, Clubbing, trace edema bilaterally of the lower extremities  Labs on Admission:  Basic Metabolic Panel:  Recent Labs Lab 10/13/2014 1152  NA 132*  K 4.7  CL 90*  CO2 30  GLUCOSE 175*  BUN 18  CREATININE 0.73  CALCIUM 8.9   Liver Function Tests:  Recent Labs Lab 10/08/2014 1152  AST 18  ALT 21  ALKPHOS 139*  BILITOT 0.4  PROT 6.1  ALBUMIN 2.4*    Recent Labs Lab 09/17/2014 1152  LIPASE 24   No results for input(s): AMMONIA in the last 168  hours. CBC:  Recent Labs Lab 09/29/2014 1152  WBC 22.1*  NEUTROABS 21.0*  HGB 10.5*  HCT 33.6*  MCV 101.8*  PLT 429*   Cardiac Enzymes:  Recent Labs Lab 10/07/2014 1152  TROPONINI <0.30    BNP (last 3 results)  Recent Labs  09/23/2014 1153  PROBNP 883.1*   CBG: No results for input(s): GLUCAP in the last 168 hours.  Radiological Exams on Admission: Dg Chest 2 View  10/13/2014   CLINICAL DATA:  Shortness of breath for 3 days, gastric cancer per patient  EXAM: CHEST  2 VIEW  COMPARISON:  09/17/2014  FINDINGS: Increased now moderate pleural effusions with new hazy perihilar airspace opacities likely indicating worsening pulmonary edema. Moderate cardiomegaly persists. Clips project over the lower abdomen.  IMPRESSION: Increased moderate pleural effusions and perihilar airspace opacities likely indicating progressive pulmonary edema.   Electronically Signed   By: Conchita Paris M.D.   On: 09/19/2014 12:26    EKG: Independently reviewed. Sinus tachycardia   Assessment/Plan Principal Problem:   Acute diastolic heart failure Active Problems:   Essential hypertension   Dyspnea   Protein-calorie malnutrition, severe   Signet-ring cell carcinoma of stomach   Pulmonary edema   DVT (deep venous thrombosis)   Pleural effusion   Leukocytosis  Pulmonary edema/acute diastolic heart failure Patient's echocardiogram from November 2014 reveals grade 1 diastolic dysfunction, today her BNP is elevated to 883.1. Chest x-ray shows progressive pulmonary edema. Will give 1 dose of Lasix 40 g IV 1 and then start scheduled 20 g IV every 12 hours. Also insert Foley catheter for intake and output.   Leukocytosis Likely secondary to steroids Patient has been afebrile, chest x-ray does not show pneumonia. UA is clear. Patient just finished prednisone taper yesterday.  DVT right lower extremity Patient had recent diagnosis of DVT of the right peroneal vein, will continue with Lovenox per  pharmacy  Pleural effusion Patient has right-sided pleural effusion, will start aggressive IV diuresis. If patient does not improve with diuretics consider therapeutic thoracentesis.   Protein calorie malnutrition Patient has J-tube in place, will continue with Osmolite. Start from 30 mL/h and then titrate up to 85 mL per hour. Continue free water 100 mL every 8 hours.  Metastatic adenocarcinoma of the stomach Patient will follow up with oncology as outpatient for  chemotherapy.  Hypertension Continue diltiazem.  Hypothyroidism Continue Synthroid. Will check TSH, her last TSH was elevated to 7.060.  Obstructive sleep apnea Continue Cpap at bedtime  DVT prophylaxis Patient on therapeutic anticoagulation with Lovenox.  Code status: Full code  Family discussion: Admission, patients condition and plan of care including tests being ordered have been discussed with the patient and *her husband at bedside who indicate understanding and agree with the plan and Code Status.  Time Spent on Admission: 75 minutes  [facility] Pager: [phone] 09/28/2014, 1:38 PM  If 7PM-7AM, please contact night-coverage  [email or web address]  Password [email or web address]

## 2014-09-21 NOTE — ED Notes (Signed)
Pt c/o SOB x 3 days.  Has been feeling SOB for "a while" but worse over the past 3 days.  States that she has stomach cancer.  No fevers at home.  NV. Has a feeding tube.

## 2014-09-21 NOTE — ED Notes (Signed)
Since last 8 hrs progressively SOB with exertion.  Pt can only speak a few words at a time. Lungs diminished with exp wheezing bilaterally.    Alert and oriented.  Hx of Stomach CA,  J tube in stomach.  Was just discharged 2 weeks ago from hospital.

## 2014-09-21 NOTE — ED Provider Notes (Signed)
CSN: [id number]     Arrival date & time 09/20/2014  1114 History   First MD Initiated Contact with Patient 10/16/2014 1124     Chief Complaint  Patient presents with  . Shortness of Breath     (Consider location/radiation/quality/duration/timing/severity/associated sxs/prior Treatment) HPI Comments: Patient presents to the ER for evaluation of shortness of breath. Patient has recently been diagnosed with stomach cancer. She recently had a prolonged hospital stay, during which time she had a G-tube placed. She has been told that she has bilateral pleural effusions. She has been having issues of shortness of breath, was recently started on home oxygen with some improvement. Today, however, she has been severely short of breath. Husband reports that she has been getting pale and perhaps cyanotic after ambulating to the bathroom. She has significantly decreased ability to ambulate and exert herself and is profoundly short of breath at rest. This is a change for her.  Patient is a 63 y.o. female presenting with shortness of breath.  Shortness of Breath   Past Medical History  Diagnosis Date  . 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 Hypothyroidism   . Obstructive sleep apnea 05/21/09    USES CPAP  . LIVER FUNCTION TESTS, ABNORMAL, HX OF 01/30/2009    Qualifier: Diagnosis of  By: Harrington Challenger, MD, Shade Flood   . PONV (postoperative nausea and vomiting)   . Hypertension   . Depression   . GERD (gastroesophageal reflux disease)   . Leiomyoma 2015    Multiple small on ultrasound, largest 24 mm  . Hodgkin disease     HISTORY OF  . Stomach cancer    Past Surgical History  Procedure Laterality Date  . Splenectomy, total  1977  . Tonsillectomy    . Open reduction and internal fixation of tibial shaft fracture with ext into the plafond  03/29/07    Angelena Form, M.D. [facility])  . Dilation and curettage of uterus    . Colposcopy    . Gynecologic cryosurgery    . Thyroid lobectomy Left  03/14/2013    Dr Constance Holster  . Thyroidectomy N/A 03/14/2013    Procedure: LEFT THYROID LOBECTOMY WITH FROZEN SECTION;  Surgeon: Izora Gala, MD;  Location: [place];  Service: ENT;  Laterality: N/A;  . Hysteroscopy  2010    HYSTEROSCOPY,D&C FOR PMB AND ENDO POLYP  . Cesarean section  1979  . Laparoscopy N/A 08/27/2014    Procedure: Exploratory Laparoscopy,  abdominal biopsy and j tube placement;  Surgeon: Leighton Ruff, MD;  Location: [facility];  Service: General;  Laterality: N/A;   Family History  Problem Relation Age of Onset  . Breast cancer Maternal Grandmother   . Hyperlipidemia Father    History  Substance Use Topics  . Smoking status: Former Smoker -- 0.25 packs/day for 40 years    Types: Cigarettes    Quit date: 03/02/2007  . Smokeless tobacco: Never Used  . Alcohol Use: Yes     Comment: 1-2 drink a few times per week   OB History    Gravida Para Term Preterm AB TAB SAB Ectopic Multiple Living   2 1 1  1     1      Review of Systems  Constitutional: Positive for fatigue.  Respiratory: Positive for shortness of breath.   All other systems reviewed and are negative.     Allergies  Review of patient's allergies indicates no known allergies.  Home Medications   Prior to Admission medications   Medication Sig Start  Date End Date Taking? Authorizing Provider  albuterol (PROVENTIL HFA;VENTOLIN HFA) 108 (90 BASE) MCG/ACT inhaler Inhale 2 puffs into the lungs every 4 (four) hours as needed for wheezing or shortness of breath.   Yes Historical Provider, MD  diltiazem (CARDIZEM) 10 mg/ml oral suspension Take 40 mg by mouth 3 (three) times daily.   Yes Historical Provider, MD  diphenhydrAMINE (BENADRYL) 12.5 MG/5ML liquid Take 12.5 mg by mouth 4 (four) times daily as needed for itching or allergies.   Yes Historical Provider, MD  enoxaparin (LOVENOX) 100 MG/ML injection Inject 90 mg into the skin daily.   Yes Historical Provider, MD  levothyroxine (SYNTHROID, LEVOTHROID) 75 MCG tablet  Take 75 mcg by mouth daily before breakfast. Dissolve completely in water before administering through j-tube   Yes Historical Provider, MD  LORazepam (ATIVAN) 0.5 MG tablet Take 0.5 mg by mouth at bedtime as needed and may repeat dose one time if needed for anxiety.   Yes Historical Provider, MD  metoCLOPramide (REGLAN) 10 MG/10ML SOLN Take 10 mg by mouth 3 (three) times daily.   Yes Historical Provider, MD  morphine (ROXANOL) 20 MG/ML concentrated solution Take 10 mg by mouth every 3 (three) hours as needed for severe pain.   Yes Historical Provider, MD  neomycin-bacitracin-polymyxin (NEOSPORIN) ointment Apply 1 application topically 2 (two) times daily. Apply to j-tube area   Yes Historical Provider, MD  Nutritional Supplements (FEEDING SUPPLEMENT, OSMOLITE 1.2 CAL,) LIQD Place 1,000 mLs into feeding tube continuous. 31ml /hr until patient states she is full   Yes Historical Provider, MD  ondansetron (ZOFRAN) 4 MG/5ML solution Take 4 mg by mouth every 8 (eight) hours as needed for nausea or vomiting.   Yes Historical Provider, MD  predniSONE (DELTASONE) 20 MG tablet 20-60 mg by Per J Tube route daily with breakfast. Take 3 tablets once daily for 2 days, then 2 tablets once daily for 4 days , then 1 tablet once daily for 4 days. Dissolve in water before administrating 09/13/14 09/23/14 Yes Historical Provider, MD  promethazine (PHENERGAN) 25 MG suppository Place 25 mg rectally every 6 (six) hours as needed for nausea or vomiting.   Yes Historical Provider, MD  ranitidine (ZANTAC) 150 MG/10ML syrup Take 150 mg by mouth 2 (two) times daily.   Yes Historical Provider, MD  Water For Irrigation, Sterile (FREE WATER) SOLN Place 100 mLs into feeding tube every 8 (eight) hours. 09/09/14  Yes Bonnielee Haff, MD  amoxicillin-clavulanate (AUGMENTIN) 400-57 MG/5ML suspension Place 10 mLs (800 mg total) into feeding tube every 12 (twelve) hours. For 7 more days. 09/09/14   Bonnielee Haff, MD   BP 115/85 mmHg   Pulse 101  Temp(Src) 98.4 F (36.9 C) (Oral)  Resp 15  SpO2 92% Physical Exam  Constitutional: She is oriented to person, place, and time. She appears well-developed and well-nourished. No distress.  HENT:  Head: Normocephalic and atraumatic.  Right Ear: Hearing normal.  Left Ear: Hearing normal.  Nose: Nose normal.  Mouth/Throat: Oropharynx is clear and moist and mucous membranes are normal.  Eyes: Conjunctivae and EOM are normal. Pupils are equal, round, and reactive to light.  Neck: Normal range of motion. Neck supple.  Cardiovascular: Regular rhythm, S1 normal and S2 normal.  Exam reveals no gallop and no friction rub.   No murmur heard. Pulmonary/Chest: Effort normal. No respiratory distress. She has decreased breath sounds. She exhibits no tenderness.  Abdominal: Soft. Normal appearance and bowel sounds are normal. There is no hepatosplenomegaly. There is no  tenderness. There is no rebound, no guarding, no tenderness at McBurney's point and negative Murphy's sign. No hernia.  Musculoskeletal: Normal range of motion. She exhibits edema (right > left).  Neurological: She is alert and oriented to person, place, and time. She has normal strength. No cranial nerve deficit or sensory deficit. Coordination normal. GCS eye subscore is 4. GCS verbal subscore is 5. GCS motor subscore is 6.  Skin: Skin is warm, dry and intact. No rash noted. No cyanosis.  Psychiatric: She has a normal mood and affect. Her speech is normal and behavior is normal. Thought content normal.  Nursing note and vitals reviewed.   ED Course  Procedures (including critical care time) Labs Review Labs Reviewed  CBC WITH DIFFERENTIAL - Abnormal; Notable for the following:    WBC 22.1 (*)    RBC 3.30 (*)    Hemoglobin 10.5 (*)    HCT 33.6 (*)    MCV 101.8 (*)    Platelets 429 (*)    Neutrophils Relative % 96 (*)    Neutro Abs 21.0 (*)    Lymphocytes Relative 2 (*)    Lymphs Abs 0.5 (*)    Monocytes Relative 2  (*)    All other components within normal limits  COMPREHENSIVE METABOLIC PANEL - Abnormal; Notable for the following:    Sodium 132 (*)    Chloride 90 (*)    Glucose, Bld 175 (*)    Albumin 2.4 (*)    Alkaline Phosphatase 139 (*)    GFR calc non Af Amer 90 (*)    All other components within normal limits  PRO B NATRIURETIC PEPTIDE - Abnormal; Notable for the following:    Pro B Natriuretic peptide (BNP) 883.1 (*)    All other components within normal limits  LIPASE, BLOOD  TROPONIN I  URINALYSIS, ROUTINE W REFLEX MICROSCOPIC  I-STAT CG4 LACTIC ACID, ED    Imaging Review Dg Chest 2 View  10/09/2014   CLINICAL DATA:  Shortness of breath for 3 days, gastric cancer per patient  EXAM: CHEST  2 VIEW  COMPARISON:  09/17/2014  FINDINGS: Increased now moderate pleural effusions with new hazy perihilar airspace opacities likely indicating worsening pulmonary edema. Moderate cardiomegaly persists. Clips project over the lower abdomen.  IMPRESSION: Increased moderate pleural effusions and perihilar airspace opacities likely indicating progressive pulmonary edema.   Electronically Signed   By: Conchita Paris M.D.   On: 10/15/2014 12:26     EKG Interpretation None       Date: 09/20/2014  Rate: 104  Rhythm: sinus tachycardia  QRS Axis: normal  Intervals: normal  ST/T Wave abnormalities: normal  Conduction Disutrbances:none  Narrative Interpretation:   Old EKG Reviewed: tachycardia compared to prior    MDM   Final diagnoses:  Shortness of breath   pulmonary edema  Patient presents to the ER for evaluation of shortness of breath. Patient was recently diagnosed with gastric cancer. She has not initiated treatment for the cancer unit. She was recently admitted for prolonged period of time with similar symptoms. During that hospitalization she had a feeding tube placed. She has had progressive shortness of breath and was started on home oxygen. This helped for a while, but in the last 1  or 2 days her breathing has worsened. Examination revealed moderate respiratory distress and significant hypoxia. This resolved with supplemental oxygen. Workup reveals evidence of moderate bilateral pleural effusions and pulmonary edema which explain the patient's level of dyspnea. She is currently on Lovenox 90  mg twice a day. I do not have concern for PE based on Lovenox treatment and other etiology that clearly would explain the level of dyspnea and hypoxia that the patient has. Will be admitted for further workup.    Orpah Greek, MD 09/25/2014 1300

## 2014-09-21 NOTE — ED Notes (Signed)
Bed: WA03 Expected date:  Expected time:  Means of arrival:  Comments: 

## 2014-09-21 NOTE — Progress Notes (Signed)
Spoke with pt regarding cpap.  Pt stated she stopped using cpap at home since finding out about her stomach cancer.  Pt refuses to wear it tonight stating cpap bothers her stomach.  Pt was advised that RT is available all night should she change her mind.  RN aware.

## 2014-09-22 ENCOUNTER — Inpatient Hospital Stay (HOSPITAL_COMMUNITY): Payer: BC Managed Care – PPO

## 2014-09-22 ENCOUNTER — Telehealth: Payer: Self-pay | Admitting: *Deleted

## 2014-09-22 DIAGNOSIS — R0600 Dyspnea, unspecified: Secondary | ICD-10-CM

## 2014-09-22 LAB — COMPREHENSIVE METABOLIC PANEL
ALK PHOS: 129 U/L — AB (ref 39–117)
ALT: 20 U/L (ref 0–35)
ANION GAP: 8 (ref 5–15)
AST: 19 U/L (ref 0–37)
Albumin: 2.3 g/dL — ABNORMAL LOW (ref 3.5–5.2)
BUN: 23 mg/dL (ref 6–23)
CO2: 33 mEq/L — ABNORMAL HIGH (ref 19–32)
CREATININE: 0.75 mg/dL (ref 0.50–1.10)
Calcium: 8.7 mg/dL (ref 8.4–10.5)
Chloride: 91 mEq/L — ABNORMAL LOW (ref 96–112)
GFR calc non Af Amer: 89 mL/min — ABNORMAL LOW (ref >90)
GLUCOSE: 118 mg/dL — AB (ref 70–99)
Potassium: 4.9 mEq/L (ref 3.7–5.3)
Sodium: 132 mEq/L — ABNORMAL LOW (ref 137–147)
TOTAL PROTEIN: 6 g/dL (ref 6.0–8.3)
Total Bilirubin: 0.2 mg/dL — ABNORMAL LOW (ref 0.3–1.2)

## 2014-09-22 LAB — CBC
HEMATOCRIT: 33.9 % — AB (ref 36.0–46.0)
HEMOGLOBIN: 10.4 g/dL — AB (ref 12.0–15.0)
MCH: 31.8 pg (ref 26.0–34.0)
MCHC: 30.7 g/dL (ref 30.0–36.0)
MCV: 103.7 fL — AB (ref 78.0–100.0)
Platelets: 416 10*3/uL — ABNORMAL HIGH (ref 150–400)
RBC: 3.27 MIL/uL — ABNORMAL LOW (ref 3.87–5.11)
RDW: 14.6 % (ref 11.5–15.5)
WBC: 17.6 10*3/uL — ABNORMAL HIGH (ref 4.0–10.5)

## 2014-09-22 MED ORDER — IPRATROPIUM-ALBUTEROL 0.5-2.5 (3) MG/3ML IN SOLN
3.0000 mL | RESPIRATORY_TRACT | Status: DC
  Administered 2014-09-22 (×3): 3 mL via RESPIRATORY_TRACT
  Filled 2014-09-22 (×4): qty 3

## 2014-09-22 MED ORDER — LORAZEPAM 2 MG/ML IJ SOLN
0.5000 mg | Freq: Once | INTRAMUSCULAR | Status: AC
  Administered 2014-09-22: 0.5 mg via INTRAVENOUS
  Filled 2014-09-22: qty 1

## 2014-09-22 MED ORDER — LEVOFLOXACIN IN D5W 500 MG/100ML IV SOLN
500.0000 mg | INTRAVENOUS | Status: DC
  Administered 2014-09-22 – 2014-09-24 (×3): 500 mg via INTRAVENOUS
  Filled 2014-09-22 (×4): qty 100

## 2014-09-22 MED ORDER — LORAZEPAM 2 MG/ML IJ SOLN
0.5000 mg | Freq: Once | INTRAMUSCULAR | Status: AC
  Filled 2014-09-22: qty 1

## 2014-09-22 MED ORDER — LORAZEPAM 2 MG/ML IJ SOLN
0.5000 mg | Freq: Once | INTRAMUSCULAR | Status: AC
Start: 2014-09-23 — End: 2014-09-22
  Administered 2014-09-22: 0.5 mg via INTRAVENOUS

## 2014-09-22 MED ORDER — LEVOTHYROXINE SODIUM 75 MCG PO TABS
150.0000 ug | ORAL_TABLET | Freq: Every day | ORAL | Status: DC
  Administered 2014-09-23 – 2014-09-25 (×3): 150 ug via ORAL
  Filled 2014-09-22: qty 1
  Filled 2014-09-22 (×2): qty 2

## 2014-09-22 MED ORDER — LEVOTHYROXINE SODIUM 75 MCG PO TABS
75.0000 ug | ORAL_TABLET | Freq: Once | ORAL | Status: AC
  Administered 2014-09-22: 75 ug via ORAL
  Filled 2014-09-22: qty 1

## 2014-09-22 MED ORDER — IPRATROPIUM-ALBUTEROL 0.5-2.5 (3) MG/3ML IN SOLN
3.0000 mL | RESPIRATORY_TRACT | Status: DC
  Filled 2014-09-22: qty 3

## 2014-09-22 NOTE — Progress Notes (Signed)
Patient declines the use of nocturnal CPAP. She states she used to use it at home, but has been unable to tolerate as of late due to increased abdominal pain/air. She is aware that she may call for assistance if she should change her mind and wish to use CPAP during this admission. Order changed to prn per RT protocol.

## 2014-09-22 NOTE — Progress Notes (Signed)
[facility] PROGRESS NOTE  Sabrina Melton NIO:[id number] DOB: Dec 15, 1950 DOA: 09/25/2014 PCP: Lucretia Kern., DO  Assessment/Plan:  Dyspnea Some improvement than yesterday Likely multifactorial from bilateral pleural effusions (small), pulmonary edema as well as underlying bronchitis? COPD, patient has 30 years history of smoking in the past. Will continue patient on Lasix 20 minute grams IV every 12 hours. Also start DuoNeb nebulizers scheduled every 4 hours. Chest x-ray shows some improvement this morning. If despite these measures patient does not improve, would consider therapeutic thoracentesis. I will also add Levaquin 500 mg daily for bronchitis.  Leukocytosis White count is down from 22,000-17,000, she continues to be afebrile. No signs and symptoms of infection, no pneumonia on chest x-ray. Patient's stopped prednisone taper 2 days ago. Most likely leukocytosis is due to prednisone.  Hypothyroidism Patient is taking Synthroid, she has a history of partial resection of thyroid. Today TSH is significantly elevated to 24.290. Will increase the dose of Synthroid to 150 g by mouth daily. She will need to have repeat TSH in 8 weeks.  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 Patient to follow-up with oncology at cancer center after discharge  Hypertension Blood pressure controlled, continue Cardizem 40 mg 3 times a day  Pleural effusion Chest x-ray shows bilateral small pleural effusion right more than left, if no improvement with diuresis consider therapeutic thoracentesis.  DVT right lower extremity Patient has recent diagnosis of DVT in the right peroneal vein, will continue with Lovenox per pharmacy  Protein calorie malnutrition Patient has J-tube in place, continue Osmolite, free water.  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 Patient was admitted for J-tube placement when the biopsy from the diaphragm revealed metastasis to the peritoneum. Patient was discharged from the hospital on 09/09/2014 with antibiotics and prednisone taper. Patient completed her prednisone taper yesterday along with Augmentin. Patient and her husband also went to [facility] for a second opinion, where they were told to follow oncology at [facility]. Patient also was recently diagnosed with DVT of the right peroneal vein and started on anticoagulation with therapeutic Lovenox. Over the past few weeks, patient has been getting progressively short of breath. She had home oxygen, which was prescribed during the previous admission. As per patient she has been getting short of breath on exertion, she feels tight on the chest, has been coughing up clear phlegm. Denies any chest pain. No nausea vomiting or diarrhea. She denies fever no dysuria urgency frequency of urination. Patient has been getting the Osmolite through the J-tube. In the ED patient was found to have pulmonary edema as BNP was elevated to 883.1, chest x-ray revealed moderate pleural effusions and perihilar space opacities indicating progressive pulmonary edema.  Patient feels better this morning, was started on IV Lasix yesterday. Chest x-ray today shows little improvement in pulmonary edema.  Objective: Filed Vitals:   09/22/14 0453  BP: 134/68  Pulse: 98  Temp: 98.2 F (36.8 C)  Resp: 22    Intake/Output Summary (Last 24 hours) at 09/22/14 1059 Last data filed at 09/22/14 0600  Gross per 24 hour  Intake  1357.[place]  ml  Output    775 ml  Net 582.42 ml   Filed Weights   10/13/2014 1500  Weight: 88.451 kg (195 lb)    Exam:  Physical Exam: Eyes: No icterus, extraocular muscles intact  Lungs: Normal respiratory effort, decreased breath sounds bilaterally, faint crackles at lung bases. Heart: Regular rate and rhythm, S1 and S2 normal, no murmurs, rubs auscultated Abdomen: BS normoactive,soft,nondistended,non-tender to palpation,no organomegaly, J-tube in place. Skin site nonerythematous. Extremities: No pretibial edema, no erythema, no cyanosis, no clubbing Neuro : Alert and oriented to time, place and person, No focal deficits Skin: No rashes seen on exam  Data Reviewed: Basic Metabolic Panel:  Recent Labs Lab 09/27/2014 1152 09/22/14 0459  NA 132* 132*  K 4.7 4.9  CL 90* 91*  CO2 30 33*  GLUCOSE 175* 118*  BUN 18 23  CREATININE 0.73 0.75  CALCIUM 8.9 8.7   Liver Function Tests:  Recent Labs Lab 10/05/2014 1152 09/22/14 0459  AST 18 19  ALT 21 20  ALKPHOS 139* 129*  BILITOT 0.4 0.2*  PROT 6.1 6.0  ALBUMIN 2.4* 2.3*    Recent Labs Lab 09/29/2014 1152  LIPASE 24   No results for input(s): AMMONIA in the last 168 hours. CBC:  Recent Labs Lab 10/05/2014 1152 09/22/14 0459  WBC 22.1* 17.6*  NEUTROABS 21.0*  --   HGB 10.5* 10.4*  HCT 33.6* 33.9*  MCV 101.8* 103.7*  PLT 429* 416*   Cardiac Enzymes:  Recent Labs Lab 10/12/2014 1152  TROPONINI <0.30   BNP (last 3 results)  Recent Labs  10/08/2014 1153  PROBNP 883.1*   CBG: No results for input(s): GLUCAP in the last 168 hours.  No results found for this or any previous visit (from the past 240 hour(s)).   Studies: Dg Chest 2 View  09/22/2014   CLINICAL DATA:  Pulmonary edema.  EXAM: CHEST  2 VIEW  COMPARISON:  September 21, 2014.  FINDINGS: Stable cardiomegaly. No pneumothorax is noted. Stable bilateral perihilar and basilar opacities are noted, with right worse than left, consistent with pulmonary edema and  associated mild bilateral pleural effusions. Bony thorax is intact.  IMPRESSION: Stable bilateral pulmonary edema with associated pleural effusions.   Electronically Signed   By: Sabino Dick M.D.   On: 09/22/2014 08:25   Dg Chest 2 View  10/12/2014   CLINICAL DATA:  Shortness of breath for 3 days, gastric cancer per patient  EXAM: CHEST  2 VIEW  COMPARISON:  09/17/2014  FINDINGS: Increased now moderate pleural effusions with new hazy perihilar airspace opacities likely indicating worsening pulmonary edema. Moderate cardiomegaly persists. Clips project over the lower abdomen.  IMPRESSION: Increased moderate pleural effusions and perihilar airspace opacities likely indicating progressive pulmonary edema.   Electronically Signed   By: Conchita Paris M.D.   On: 09/30/2014 12:26    Scheduled Meds: . diltiazem  40 mg Oral TID  . enoxaparin (LOVENOX) injection  1 mg/kg Subcutaneous Q12H  . free water  100 mL Per Tube 3 times per day  . furosemide  20 mg Intravenous BID  . ipratropium-albuterol  3 mL Nebulization Q4H  . levothyroxine  75 mcg Oral QAC breakfast  . metoCLOPramide  10 mg Per Tube TID  . neomycin-bacitracin-polymyxin  1 application Topical BID  . ranitidine  150 mg Per Tube BID  . sodium chloride  3 mL Intravenous Q12H  . sodium chloride  3 mL Intravenous Q12H   Continuous Infusions: . feeding supplement (OSMOLITE 1.2 CAL) 1,000  mL (09/22/14 0549)    Principal Problem:   Acute diastolic heart failure Active Problems:   Essential hypertension   Dyspnea   Protein-calorie malnutrition, severe   Signet-ring cell carcinoma of stomach   Pulmonary edema   DVT (deep venous thrombosis)   Pleural effusion    Time spent: 25 min    [facility]  [facility] Pager [phone]*. If 7PM-7AM, please contact night-coverage at [email or web address], password [facility] 09/22/2014, 10:59 AM  LOS: 1 day

## 2014-09-22 NOTE — Telephone Encounter (Signed)
Received call from pt's husband, Collier Salina, stating that pt was admitted yest with SOB & is on lasix & Dr Darrick Meigs would like to obseve for 24-48 hours.  He states that they also went to [facility] for 2nd opinion & MD agreed with Dr. Burr Medico on diagnosis & treatment but that MD would like to talk with Dr Burr Medico. Reported to Dr Burr Medico.

## 2014-09-22 NOTE — Care Management Note (Signed)
    Page 1 of 1   09/22/2014     12:41:47 PM CARE MANAGEMENT NOTE 09/22/2014  Patient:  Sabrina Melton, Sabrina Melton   Account Number:  [id number]  Date Initiated:  09/22/2014  Documentation initiated by:  Dessa Phi  Subjective/Objective Assessment:   63 Y/O F ADMITTED W/CHF.READMIT 11/10-11/24-DEHYDRATION.METASTATIC PERITONEUM CA,GASTRIC CA.S/P PEG.     Action/Plan:   FROM HOME W/SPOUSE.ACTIVE W/[facility]-HHRN-PEG TUBE CARE/NUTRITION.[facility] DME-PEG TUBE SUPPLIES.HAS 3N1,RW,HOME 02-[facility].   Anticipated DC Date:  10-12-14   Anticipated DC Plan:  [place]  CM consult      [facility] Choice  Resumption Of Svcs/PTA Provider   Choice offered to / List presented to:             Status of service:  In process, will continue to follow Medicare Important Message given?   (If response is "NO", the following Medicare IM given date fields will be blank) Date Medicare IM given:   Medicare IM given by:   Date Additional Medicare IM given:   Additional Medicare IM given by:    Discharge Disposition:    Per UR Regulation:  Reviewed for med. necessity/level of care/duration of stay  If discussed at [place] of Stay Meetings, dates discussed:    Comments:  09/22/14 Bernice [place] RN,BSN NCM [place] W/HHRN-PEG FEEDING/SUPPLIES.WILL NEED HHRN ORDERS @ D/C.SPOUSE PROVIDED W/PRIVATE SITTER LIST(INDEPENDANT DECISION/OUT OF POCKET COST) FOR ADDITIONAL RESOURCES WHEN HE NEEDS TO LEAVE HOUSE.VOICED UNDERSTANDING.RECOMMEND PT CONS.

## 2014-09-23 ENCOUNTER — Other Ambulatory Visit (HOSPITAL_COMMUNITY)
Admission: RE | Admit: 2014-09-23 | Discharge: 2014-09-23 | Disposition: A | Discharge status: Home / Self Care | Payer: BC Managed Care – PPO | Source: Ambulatory Visit | Attending: Hematology | Admitting: Hematology

## 2014-09-23 ENCOUNTER — Inpatient Hospital Stay (HOSPITAL_COMMUNITY): Payer: BC Managed Care – PPO

## 2014-09-23 DIAGNOSIS — C169 Malignant neoplasm of stomach, unspecified: Secondary | ICD-10-CM | POA: Insufficient documentation

## 2014-09-23 DIAGNOSIS — J9601 Acute respiratory failure with hypoxia: Secondary | ICD-10-CM

## 2014-09-23 DIAGNOSIS — J90 Pleural effusion, not elsewhere classified: Secondary | ICD-10-CM

## 2014-09-23 DIAGNOSIS — C799 Secondary malignant neoplasm of unspecified site: Secondary | ICD-10-CM

## 2014-09-23 DIAGNOSIS — J9600 Acute respiratory failure, unspecified whether with hypoxia or hypercapnia: Secondary | ICD-10-CM

## 2014-09-23 LAB — CBC WITH DIFFERENTIAL/PLATELET
BASOS ABS: 0 10*3/uL (ref 0.0–0.1)
BASOS PCT: 0 % (ref 0–1)
Eosinophils Absolute: 0.1 10*3/uL (ref 0.0–0.7)
Eosinophils Relative: 1 % (ref 0–5)
HCT: 35 % — ABNORMAL LOW (ref 36.0–46.0)
HEMOGLOBIN: 10.9 g/dL — AB (ref 12.0–15.0)
Lymphocytes Relative: 6 % — ABNORMAL LOW (ref 12–46)
Lymphs Abs: 1.1 10*3/uL (ref 0.7–4.0)
MCH: 32.2 pg (ref 26.0–34.0)
MCHC: 31.1 g/dL (ref 30.0–36.0)
MCV: 103.6 fL — ABNORMAL HIGH (ref 78.0–100.0)
Monocytes Absolute: 0.5 10*3/uL (ref 0.1–1.0)
Monocytes Relative: 3 % (ref 3–12)
NEUTROS PCT: 90 % — AB (ref 43–77)
Neutro Abs: 15.2 10*3/uL — ABNORMAL HIGH (ref 1.7–7.7)
Platelets: 447 10*3/uL — ABNORMAL HIGH (ref 150–400)
RBC: 3.38 MIL/uL — ABNORMAL LOW (ref 3.87–5.11)
RDW: 14.9 % (ref 11.5–15.5)
WBC: 16.9 10*3/uL — ABNORMAL HIGH (ref 4.0–10.5)

## 2014-09-23 LAB — BASIC METABOLIC PANEL
Anion gap: 7 (ref 5–15)
BUN: 23 mg/dL (ref 6–23)
CO2: 34 mEq/L — ABNORMAL HIGH (ref 19–32)
Calcium: 8.3 mg/dL — ABNORMAL LOW (ref 8.4–10.5)
Chloride: 88 mEq/L — ABNORMAL LOW (ref 96–112)
Creatinine, Ser: 0.56 mg/dL (ref 0.50–1.10)
GFR calc Af Amer: >90 mL/min (ref >90)
GFR calc non Af Amer: >90 mL/min (ref >90)
Glucose, Bld: 208 mg/dL — ABNORMAL HIGH (ref 70–99)
Potassium: 4.6 mEq/L (ref 3.7–5.3)
Sodium: 129 mEq/L — ABNORMAL LOW (ref 137–147)

## 2014-09-23 LAB — MRSA PCR SCREENING: MRSA by PCR: NEGATIVE

## 2014-09-23 MED ORDER — IPRATROPIUM BROMIDE 0.02 % IN SOLN: 0.5000 mg | RESPIRATORY_TRACT | Status: DC

## 2014-09-23 MED ORDER — FUROSEMIDE 10 MG/ML IJ SOLN
40.0000 mg | Freq: Three times a day (TID) | INTRAMUSCULAR | Status: DC
  Administered 2014-09-23 – 2014-09-25 (×7): 40 mg via INTRAVENOUS
  Filled 2014-09-23 (×11): qty 4

## 2014-09-23 MED ORDER — LEVALBUTEROL HCL 0.63 MG/3ML IN NEBU
0.6300 mg | INHALATION_SOLUTION | Freq: Four times a day (QID) | RESPIRATORY_TRACT | Status: DC | PRN
  Filled 2014-09-23: qty 3

## 2014-09-23 MED ORDER — FUROSEMIDE 10 MG/ML IJ SOLN: 40.0000 mg | Freq: Two times a day (BID) | INTRAMUSCULAR | Status: DC

## 2014-09-23 MED ORDER — LORAZEPAM 2 MG/ML IJ SOLN
INTRAMUSCULAR | Status: AC
  Administered 2014-09-23: 1 mg
  Filled 2014-09-23: qty 1

## 2014-09-23 MED ORDER — FUROSEMIDE 10 MG/ML IJ SOLN
20.0000 mg | Freq: Once | INTRAMUSCULAR | Status: AC
  Administered 2014-09-23: 20 mg via INTRAVENOUS
  Filled 2014-09-23: qty 2

## 2014-09-23 MED ORDER — LORAZEPAM 2 MG/ML IJ SOLN: 1.0000 mg | Freq: Three times a day (TID) | INTRAMUSCULAR | Status: DC | PRN

## 2014-09-23 NOTE — Consult Note (Signed)
PULMONARY / CRITICAL CARE MEDICINE   Name: Sabrina Melton MRN: [id number] DOB: May 22, 1951    ADMISSION DATE:  09/27/2014 CONSULTATION DATE:  09/23/2014  REFERRING MD :  Darrick Meigs, [facility]  CHIEF COMPLAINT:  resp distress  INITIAL PRESENTATION: 63 year old ex-smoker severe OSA and recently diagnosed aggressive, metastatic gastric cancer, we're consulted for respiratory distress, worsening hypoxia and bilateral effusions  STUDIES:  CT chest 08/27/2014- bilateral effusions, moderate pericardial effusion, mild ascites 09/17/14 duplex-right peroneal DVT 08/2013 echo-normal LV function, dilated RV  SIGNIFICANT EVENTS: 08/18/14 stomach biopsy-poorly differentiated adenocarcinoma 08/27/14 laparoscopic staging, peritoneal metastases, J-tube placement   HISTORY OF PRESENT ILLNESS:  63 year old ex-smoker, quit 8 years ago, sees my partner Dr. Braulio Conte for severe OSA maintained on C Pap. As noted above, recent diagnosis of metastatic gastric cancer, she went to [facility] for a second opinion, chemotherapy was to start soon, but her functional status was very poor. Course was further complicated by finding of right lower extremity DVT, started on Lovenox. Review of imaging studies shows progressive effusions since her CAT scan in early November. Echo in the past has shown normal LV function. Home oxygen was prescribed during her last discharge from 09/09/14. She was readmitted on 12/6 with progressive dyspnea, chest x-ray showed bilateral effusions and a pattern of pulmonary edema, BNP was elevated to 800. 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  has past surgical history that includes Splenectomy, total (1977); Tonsillectomy; OPEN REDUCTION AND INTERNAL FIXATION OF TIBIAL SHAFT FRACTURE WITH EXT INTO THE PLAFOND (03/29/07); Dilation and curettage of uterus; Colposcopy; Gynecologic cryosurgery; Thyroid lobectomy (Left, 03/14/2013); Thyroidectomy (N/A, 03/14/2013); Hysteroscopy (2010); Cesarean section (1979); and laparoscopy (N/A, 08/27/2014). Prior to Admission medications   Medication Sig Start Date End Date Taking? Authorizing Provider  albuterol (PROVENTIL HFA;VENTOLIN HFA) 108 (90 BASE) MCG/ACT inhaler Inhale 2 puffs into the lungs every 4 (four) hours as needed for wheezing or shortness of breath.   Yes Historical Provider, MD  diltiazem (CARDIZEM) 10 mg/ml oral suspension Take 40 mg by mouth 3 (three) times daily.   Yes Historical Provider, MD  diphenhydrAMINE (BENADRYL) 12.5 MG/5ML liquid Take 12.5 mg by mouth 4 (four) times daily as needed for itching or allergies.   Yes Historical Provider, MD  enoxaparin (LOVENOX) 100 MG/ML injection Inject 90 mg into the skin daily.   Yes Historical Provider, MD  levothyroxine (SYNTHROID, LEVOTHROID) 75 MCG tablet Take 75 mcg by mouth daily before breakfast. Dissolve completely in water before administering through j-tube   Yes Historical Provider, MD  LORazepam (ATIVAN) 0.5 MG tablet Take 0.5 mg by mouth at bedtime as needed and may repeat dose one time if needed for anxiety.   Yes Historical Provider, MD  metoCLOPramide (REGLAN) 10 MG/10ML SOLN Take 10 mg by mouth 3 (three) times daily.   Yes Historical Provider, MD  morphine (ROXANOL) 20 MG/ML concentrated solution Take 10 mg by mouth every 3 (three) hours as needed for severe pain.   Yes Historical Provider, MD  neomycin-bacitracin-polymyxin (NEOSPORIN) ointment Apply 1 application topically 2 (two) times daily. Apply to j-tube area   Yes Historical Provider, MD  Nutritional Supplements (FEEDING SUPPLEMENT, OSMOLITE 1.2 CAL,) LIQD Place  1,000 mLs into feeding tube continuous. 76ml /hr  until patient states she is full   Yes Historical Provider, MD  ondansetron (ZOFRAN) 4 MG/5ML solution Take 4 mg by mouth every 8 (eight) hours as needed for nausea or vomiting.   Yes Historical Provider, MD  predniSONE (DELTASONE) 20 MG tablet 20-60 mg by Per J Tube route daily with breakfast. Take 3 tablets once daily for 2 days, then 2 tablets once daily for 4 days , then 1 tablet once daily for 4 days. Dissolve in water before administrating 09/13/14 09/23/14 Yes Historical Provider, MD  promethazine (PHENERGAN) 25 MG suppository Place 25 mg rectally every 6 (six) hours as needed for nausea or vomiting.   Yes Historical Provider, MD  ranitidine (ZANTAC) 150 MG/10ML syrup Take 150 mg by mouth 2 (two) times daily.   Yes Historical Provider, MD  Water For Irrigation, Sterile (FREE WATER) SOLN Place 100 mLs into feeding tube every 8 (eight) hours. 09/09/14  Yes Bonnielee Haff, MD  amoxicillin-clavulanate (AUGMENTIN) 400-57 MG/5ML suspension Place 10 mLs (800 mg total) into feeding tube every 12 (twelve) hours. For 7 more days. 09/09/14   Bonnielee Haff, MD   No Known Allergies  FAMILY HISTORY:  has no family status information on file.  SOCIAL HISTORY:  reports that she quit smoking about 7 years ago. Her smoking use included Cigarettes. She has a 10 pack-year smoking history. She has never used smokeless tobacco. She reports that she drinks alcohol. She reports that she does not use illicit drugs.  REVIEW OF SYSTEMS:  Unable to obtain  SUBJECTIVE:   VITAL SIGNS: Temp:  [97.7 F (36.5 C)-97.8 F (36.6 C)] 97.8 F (36.6 C) (12/08 0522) Pulse Rate:  [98-104] 98 (12/08 1108) Resp:  [20] 20 (12/08 0522) BP: (102-148)/(55-76) 139/61 mmHg (12/08 1108) SpO2:  [92 %-96 %] 94 % (12/08 1108) Weight:  [91 kg (200 lb 9.9 oz)] 91 kg (200 lb 9.9 oz) (12/08 0518) HEMODYNAMICS:   VENTILATOR SETTINGS:   INTAKE / OUTPUT:  Intake/Output Summary (Last 24  hours) at 09/23/14 1118 Last data filed at 09/23/14 0848  Gross per 24 hour  Intake   2225 ml  Output   1245 ml  Net    980 ml    PHYSICAL EXAMINATION: General:  Obese, appears chronically ill, out of bed to chair Neuro:  Somnolent, follows one-step commands HEENT:  Accessory muscles plus, neck veins flat, dry mucosa Cardiovascular:  S1 and S2 tachycardia Lungs:  Decreased breath sounds bilateral, no rhonchi Abdomen:  Soft, distended, J-tube Musculoskeletal:  Good pulses, 2+ peripheral edema Skin:  Pink, no rash  LABS:  CBC  Recent Labs Lab 10/16/2014 1152 09/22/14 0459 09/23/14 0800  WBC 22.1* 17.6* 16.9*  HGB 10.5* 10.4* 10.9*  HCT 33.6* 33.9* 35.0*  PLT 429* 416* 447*   Coag's No results for input(s): APTT, INR in the last 168 hours. BMET  Recent Labs Lab 09/25/2014 1152 09/22/14 0459 09/23/14 0800  NA 132* 132* 129*  K 4.7 4.9 4.6  CL 90* 91* 88*  CO2 30 33* 34*  BUN 18 23 23   CREATININE 0.73 0.75 0.56  GLUCOSE 175* 118* 208*   Electrolytes  Recent Labs Lab 10/02/2014 1152 09/22/14 0459 09/23/14 0800  CALCIUM 8.9 8.7 8.3*   Sepsis Markers  Recent Labs Lab 09/22/2014 1215  LATICACIDVEN 1.22   ABG No results for input(s): PHART, PCO2ART, PO2ART in the last 168 hours. Liver Enzymes  Recent Labs Lab 10/05/2014 1152 09/22/14 0459  AST 18 19  ALT 21 20  ALKPHOS 139*  129*  BILITOT 0.4 0.2*  ALBUMIN 2.4* 2.3*   Cardiac Enzymes  Recent Labs Lab 10/13/2014 1152 10/10/2014 1153  TROPONINI <0.30  --   PROBNP  --  883.1*   Glucose No results for input(s): GLUCAP in the last 168 hours.  Imaging Dg Chest 2 View  09/22/2014   CLINICAL DATA:  Pulmonary edema.  EXAM: CHEST  2 VIEW  COMPARISON:  September 21, 2014.  FINDINGS: Stable cardiomegaly. No pneumothorax is noted. Stable bilateral perihilar and basilar opacities are noted, with right worse than left, consistent with pulmonary edema and associated mild bilateral pleural effusions. Bony thorax is  intact.  IMPRESSION: Stable bilateral pulmonary edema with associated pleural effusions.   Electronically Signed   By: Sabino Dick M.D.   On: 09/22/2014 08:25     ASSESSMENT / PLAN:  PULMONARY OETT A: Acute hypoxic respiratory failure Severe OSA Bilateral pleural effusions P:   Will attempt thoracentesis, once Lovenox cleared from her system Use BiPAP now and when necessary Husband is debating DO NOT RESUSCITATE status   CARDIOVASCULAR CVL A: Hypertension Echo 2014 normal LV function P:  Lasix 40 IV for diuretic  RENAL A:  Hyponatremia Hypovolemia P:   Diuresis Monitor electrolytes  GASTROINTESTINAL A:  Advanced gastric cancer Protein calorie malnutrition, status post J-tube P:   Hold tube feeds for now  HEMATOLOGIC A:  RLE  DVT P:  Hold Lovenox in anticipation of thoracentesis  ID A:  no clear infection, rule out HCAP P:    Abx: Levaquin12 /6 >>  ENDOCRINE A:  No issues    P:   SSI while nothing by mouth  NEUROLOGIC A:  Anxiety, chronic pain-received Ativan P:   RASS goal: 0 avoid benzos , use morphine for work of breathing and pain    FAMILY  - Updates: Husband at bedside in detail, he understands prognosis, would like some more time to get things in order, full code for now, we'll try to temporize with BiPAP  - Inter-disciplinary family meet or Palliative Care meeting due by: 12/13    TODAY'S SUMMARY: Respiratory distress and worsening hypoxemia, likely related to advanced malignancy with bilateral effusions, pulmonary embolism less likely but possible. Would advocate for care limitations, since underlying malignancy cannot be treated.     The patient is critically ill with multiple organ systems failure and requires high complexity decision making for assessment and support, frequent evaluation and titration of therapies, application of advanced monitoring technologies and extensive interpretation of multiple databases. Critical Care Time  devoted to patient care services described in this note independent of APP time is 50 minutes.   Kara Mead MD. Shade Flood. [facility] Pager [phone] If no response call [phone]   09/23/2014, 11:18 AM

## 2014-09-23 NOTE — Progress Notes (Signed)
Patient and husband refuses bipap.

## 2014-09-23 NOTE — Progress Notes (Signed)
Per MD order- RT placed PT on BiPAP. Mode and settings documented in EPIC. PT tolerating BiPAP at this time, Sp02 96. Tracie Harrier (Manager) at bedside for BiPAP management. RN aware and has contacted MD with concerns related to PT LOC and breathing pattern.

## 2014-09-23 NOTE — Progress Notes (Signed)
INITIAL NUTRITION ASSESSMENT  DOCUMENTATION CODES Per approved criteria  -Severe malnutrition in the context of chronic illness  Pt meets criteria for severe MALNUTRITION in the context of chronic illness as evidenced by pt not consistently meeting estimated nutrition needs for < one month, 20% body weight loss in < 6 months .   INTERVENTION: -Recommend to initiate home regimen of Osmolite 1.2 @ 85 ml/hr for 18 hours as tolerated Tube feeding regimen provides1836  kcal (100% of needs), 85 grams of protein (94% est protein needs), and 1255 ml of H2O.  -RD to continue to monitor  NUTRITION DIAGNOSIS: Inadequate oral intake related to inability to eat as evidenced by NPO status.   Goal: Pt to meet >/= 90% of their estimated nutrition needs    Monitor:  TF order, total protein/energy intake, labs, weights  Reason for Assessment: Home TF patient  63 y.o. female  Admitting Dx: Acute diastolic heart failure  ASSESSMENT: Patient continued to have shortness of breath despite diuresis with Lasix and DuoNeb nebulizers. Very anxious this morning. CCM consulted, and plan is to move to the ICU. Patient might require intubation and mechanical ventilation and later thoracentesis. Patient has very poor prognosis.  -Pt known to RD from previous admit in 09/08/2014. 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 Jtube was placed during previous admit and pt d/c on Osmolite 1.2 at 65 ml/hr for 24 hr with goal of advancing to 120 ml/hr for 13 hours nocturnal feeds. Pt to remain NPO. Experienced reflux and nausea that prolonged advancement to goal rate -Pt's husband reported pt was tolerating tube feed regimen at home; was receiving Osmolite 1.2 at 85 ml/hr continuous until pt indicated she was full. TF was infusing at 85 ml/hr when pt first admitted; TF is currently held as pt on Bipap and was awaiting  transfer to ICU -Wt increased 8 lb from d/c in 11/23, likely related to fluids as pt with +2 edema in  bilateral lower extremities -Pt may need intubation/ventilation. RD to continue to monitor for nutrition needs.    Height: Ht Readings from Last 1 Encounters:  10/11/2014 5\' 6"  (1.676 m)    Weight: Wt Readings from Last 1 Encounters:  09/23/14 200 lb 9.9 oz (91 kg)    Ideal Body Weight: 130 lb  % Ideal Body Weight: 154  Wt Readings from Last 10 Encounters:  09/23/14 200 lb 9.9 oz (91 kg)  09/17/14 201 lb 11.2 oz (91.491 kg)  09/09/14 192 lb (87.091 kg)  08/26/14 167 lb 3.2 oz (75.841 kg)  08/24/14 161 lb (73.029 kg)  08/13/14 170 lb (77.111 kg)  08/02/14 175 lb (79.379 kg)  07/09/14 189 lb (85.73 kg)  04/23/14 205 lb (92.987 kg)  04/23/14 205 lb (92.987 kg)    Usual Body Weight: 208 lb  % Usual Body Weight: 93%  BMI:  Body mass index is 32.4 kg/(m^2).  Estimated Nutritional Needs: Kcal: 9741-6384 Protein: 90-100 gram Fluid: >/= 1800 ml daily  Skin: stg 2 pressure ulcer on sacral and left buttock  Diet Order: Diet NPO time specified  EDUCATION NEEDS: -No education needs identified at this time   Intake/Output Summary (Last 24 hours) at 09/23/14 1330 Last data filed at 09/23/14 0848  Gross per 24 hour  Intake   2225 ml  Output   1245 ml  Net    980 ml    Last BM: 12/07  Labs:   Recent Labs Lab 09/28/2014 1152 09/22/14 0459 09/23/14 0800  NA 132* 132* 129*  K 4.7 4.9 4.6  CL 90* 91* 88*  CO2 30 33* 34*  BUN 18 23 23   CREATININE 0.73 0.75 0.56  CALCIUM 8.9 8.7 8.3*  GLUCOSE 175* 118* 208*    CBG (last 3)  No results for input(s): GLUCAP in the last 72 hours.  Scheduled Meds: . diltiazem  40 mg Oral TID  . furosemide  40 mg Intravenous 3 times per day  . ipratropium  0.5 mg Nebulization Q4H  . levofloxacin (LEVAQUIN) IV  500 mg Intravenous Q24H  . levothyroxine  150 mcg Oral QAC breakfast  . metoCLOPramide  10 mg Per Tube TID  .  neomycin-bacitracin-polymyxin  1 application Topical BID  . ranitidine  150 mg Per Tube BID  . sodium chloride  3 mL Intravenous Q12H  . sodium chloride  3 mL Intravenous Q12H    Continuous Infusions:   Past Medical History  Diagnosis Date  . 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 Hypothyroidism   . Obstructive sleep apnea 05/21/09    USES CPAP  . LIVER FUNCTION TESTS, ABNORMAL, HX OF 01/30/2009    Qualifier: Diagnosis of  By: Harrington Challenger, MD, [place]   . PONV (postoperative nausea and vomiting)   . Hypertension   . Depression   . GERD (gastroesophageal reflux disease)   . Leiomyoma 2015    Multiple small on ultrasound, largest 24 mm  . Hodgkin disease     HISTORY OF  . Stomach cancer     Past Surgical History  Procedure Laterality Date  . Splenectomy, total  1977  . Tonsillectomy    . Open reduction and internal fixation of tibial shaft fracture with ext into the plafond  03/29/07    Angelena Form, M.D. [facility])  . Dilation and curettage of uterus    . Colposcopy    . Gynecologic cryosurgery    . Thyroid lobectomy Left 03/14/2013    Dr Constance Holster  . Thyroidectomy N/A 03/14/2013    Procedure: LEFT THYROID LOBECTOMY WITH FROZEN SECTION;  Surgeon: Izora Gala, MD;  Location: [place];  Service: ENT;  Laterality: N/A;  . Hysteroscopy  2010    HYSTEROSCOPY,D&C FOR PMB AND ENDO POLYP  . Cesarean section  1979  . Laparoscopy N/A 08/27/2014    Procedure: Exploratory Laparoscopy,  abdominal biopsy and j tube placement;  Surgeon: Leighton Ruff, MD;  Location: [facility];  Service: General;  Laterality: N/A;    Atlee Abide MS RD LDN Clinical Dietitian [phone]

## 2014-09-23 NOTE — Progress Notes (Signed)
Patient transported from 4W to ICU 27 via Non rebreather (100% O2).  Patient maintained SpO2 at 99 and was placed back on BiPAP once in the room.

## 2014-09-23 NOTE — Progress Notes (Signed)
[facility] PROGRESS NOTE  CAIT LOCUST IHK:[id number] DOB: 26-Mar-1951 DOA: 10/16/2014 PCP: Lucretia Kern., DO  Assessment/Plan:  Acute respiratory failure Likely due to the malignant effusions, pulmonary edema Patient continued to have shortness of breath despite diuresis with Lasix and DuoNeb nebulizers. Very anxious this morning. CCM consulted, and plan is to move to the ICU. Patient might require intubation and mechanical ventilation and later thoracentesis. Patient has very poor prognosis. Dose of Lasix has been changed to 40 g IV every 12 hours per CCM.  Leukocytosis White count is down from 22,000-17,000, she continues to be afebrile. No signs and symptoms of infection, no pneumonia on chest x-ray. Patient's stopped prednisone taper 2 days ago. Most likely leukocytosis is due to prednisone.  Hypothyroidism Patient is taking Synthroid, she has a history of partial resection of thyroid. Today TSH is significantly elevated to 24.290. Will increase the dose of Synthroid to 150 g by mouth daily. She will need to have repeat TSH in 8 weeks.  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 Patient to follow-up with oncology at cancer center after discharge  Hypertension Blood pressure controlled, continue Cardizem 40 mg 3 times a day  Pleural effusion ? Malignant, will need therapeutic thoracentesis as per CCM  DVT right lower extremity Patient has recent diagnosis of DVT in the right peroneal vein, Lovenox will be held in anticipation for thoracentesis. Consider restarting Lovenox after thoracentesis.  Protein calorie malnutrition Patient has J-tube in place, continue Osmolite, free water.  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 Patient was admitted for J-tube placement when the biopsy from the diaphragm revealed metastasis to the peritoneum. Patient was discharged from the hospital on 09/09/2014 with antibiotics and prednisone taper. Patient completed her prednisone taper yesterday along with Augmentin. Patient and her husband also went to [facility] for a second opinion, where they were told to follow oncology at [facility]. Patient also was recently diagnosed with DVT of the right peroneal vein and started on anticoagulation with therapeutic Lovenox. Over the past few weeks, patient has been getting progressively short of breath. She had home oxygen, which was prescribed during the previous admission. As per patient she has been getting short of breath on exertion, she feels tight on the chest, has been coughing up clear phlegm. Denies any chest pain. No nausea vomiting or diarrhea. She denies fever no dysuria urgency frequency of urination. Patient has been getting the Osmolite through the J-tube. In the ED patient was found to have pulmonary edema as BNP was elevated to 883.1, chest x-ray revealed moderate pleural effusions and perihilar space opacities indicating progressive pulmonary edema.  Patient continues to have shortness of breath, not much response with diuretics as well as DuoNeb nebulizers.  Objective: Filed Vitals:   09/23/14 1108  BP: 139/61  Pulse: 98  Temp:   Resp:     Intake/Output Summary (Last 24 hours) at 09/23/14 1126 Last data filed at 09/23/14 0848  Gross per 24 hour  Intake   2225 ml  Output   1245 ml  Net    980 ml   [employer]  10/13/2014 1500  09/23/14 0518  Weight: 88.451 kg (195 lb) 91 kg (200 lb 9.9 oz)    Exam:  Physical Exam: Eyes: No icterus, extraocular muscles intact  Lungs: Tachypnea, decreased air entry bilaterally, scattered wheezing Heart: Regular rate and rhythm, S1 and S2 normal, no murmurs, rubs auscultated Abdomen: BS normoactive,soft,nondistended,non-tender to palpation,no organomegaly, J-tube in place. Skin site nonerythematous. Extremities: No pretibial edema, no erythema, no cyanosis, no clubbing Neuro : Alert and oriented to time, place and person, No focal deficits Skin: No rashes seen on exam  Data Reviewed: Basic Metabolic Panel:  Recent Labs Lab 09/20/2014 1152 09/22/14 0459 09/23/14 0800  NA 132* 132* 129*  K 4.7 4.9 4.6  CL 90* 91* 88*  CO2 30 33* 34*  GLUCOSE 175* 118* 208*  BUN 18 23 23   CREATININE 0.73 0.75 0.56  CALCIUM 8.9 8.7 8.3*   Liver Function Tests:  Recent Labs Lab 10/14/2014 1152 09/22/14 0459  AST 18 19  ALT 21 20  ALKPHOS 139* 129*  BILITOT 0.4 0.2*  PROT 6.1 6.0  ALBUMIN 2.4* 2.3*    Recent Labs Lab 10/03/2014 1152  LIPASE 24   No results for input(s): AMMONIA in the last 168 hours. CBC:  Recent Labs Lab 10/06/2014 1152 09/22/14 0459 09/23/14 0800  WBC 22.1* 17.6* 16.9*  NEUTROABS 21.0*  --  15.2*  HGB 10.5* 10.4* 10.9*  HCT 33.6* 33.9* 35.0*  MCV 101.8* 103.7* 103.6*  PLT 429* 416* 447*   Cardiac Enzymes:  Recent Labs Lab 10/03/2014 1152  TROPONINI <0.30   BNP (last 3 results)  Recent Labs  09/27/2014 1153  PROBNP 883.1*   CBG: No results for input(s): GLUCAP in the last 168 hours.  No results found for this or any previous visit (from the past 240 hour(s)).   Studies: Dg Chest 2 View  09/22/2014   CLINICAL DATA:  Pulmonary edema.  EXAM: CHEST  2 VIEW  COMPARISON:  September 21, 2014.  FINDINGS: Stable cardiomegaly. No pneumothorax is noted. Stable bilateral perihilar and basilar opacities are noted, with right worse than left, consistent  with pulmonary edema and associated mild bilateral pleural effusions. Bony thorax is intact.  IMPRESSION: Stable bilateral pulmonary edema with associated pleural effusions.   Electronically Signed   By: Sabino Dick M.D.   On: 09/22/2014 08:25   Dg Chest 2 View  09/25/2014   CLINICAL DATA:  Shortness of breath for 3 days, gastric cancer per patient  EXAM: CHEST  2 VIEW  COMPARISON:  09/17/2014  FINDINGS: Increased now moderate pleural effusions with new hazy perihilar airspace opacities likely indicating worsening pulmonary edema. Moderate cardiomegaly persists. Clips project over the lower abdomen.  IMPRESSION: Increased moderate pleural effusions and perihilar airspace opacities likely indicating progressive pulmonary edema.   Electronically Signed   By: Conchita Paris M.D.   On: 10/14/2014 12:26    Scheduled Meds: . diltiazem  40 mg Oral TID  . enoxaparin (LOVENOX) injection  1 mg/kg Subcutaneous Q12H  . free water  100 mL Per Tube 3 times per day  . furosemide  20 mg Intravenous Once  . furosemide  40 mg Intravenous BID  . ipratropium  0.5 mg Nebulization Q4H  . levofloxacin (LEVAQUIN) IV  500 mg Intravenous Q24H  . levothyroxine  150 mcg Oral QAC breakfast  . metoCLOPramide  10 mg Per Tube TID  . neomycin-bacitracin-polymyxin  1 application Topical BID  . ranitidine  150 mg Per Tube BID  . sodium chloride  3 mL  Intravenous Q12H  . sodium chloride  3 mL Intravenous Q12H   Continuous Infusions: . feeding supplement (OSMOLITE 1.2 CAL) 1,000 mL (09/23/14 0546)    Principal Problem:   Acute diastolic heart failure Active Problems:   Essential hypertension   Dyspnea   Protein-calorie malnutrition, severe   Signet-ring cell carcinoma of stomach   Pulmonary edema   DVT (deep venous thrombosis)   Pleural effusion    Time spent: 25 min    [facility]  [facility] Pager [phone]*. If 7PM-7AM, please contact night-coverage at [email or web address], password [email or web address] 09/23/2014,  11:26 AM  LOS: 2 days

## 2014-09-23 NOTE — Progress Notes (Signed)
NIV PS

## 2014-09-23 NOTE — Progress Notes (Signed)
Long discussion w/ husband. Still working on goals of care. He seems to understand about the clinical scenario and poor prognosis. He currently would want intubation short term to allow for family to arrive to say their good-byes. Have encouraged him to contact family now. Would prefer to not put pt on vent for something we can't make better.  Plan Cont NIPPV-->she looks much better Will cont to work on goals of care w/ pt and family  Erick Colace ACNP-BC [place] Pager # [phone] OR # [phone] if no answer

## 2014-09-23 NOTE — Progress Notes (Signed)
Sabrina Melton   DOB:1950/11/17   NF#:[id number]   QIO#:[id number]  Subjective: I saw her in MICU this afternoon and discussed the goal of care with her and her husband. Events noticed.    Objective:  Filed Vitals:   09/23/14 1600  BP: 123/67  Pulse: 113  Temp:   Resp: 22    Body mass index is 32.4 kg/(m^2).  Intake/Output Summary (Last 24 hours) at 09/23/14 1707 Last data filed at 09/23/14 0848  Gross per 24 hour  Intake   2125 ml  Output    645 ml  Net   1480 ml      CBG (last 3)  No results for input(s): GLUCAP in the last 72 hours.   Labs:  Lab Results  Component Value Date   WBC 16.9* 09/23/2014   HGB 10.9* 09/23/2014   HCT 35.0* 09/23/2014   MCV 103.6* 09/23/2014   PLT 447* 09/23/2014   NEUTROABS 15.2* 09/23/2014    @LASTCHEMISTRY @  Urine Studies No results for input(s): UHGB, CRYS in the last 72 hours.  Invalid input(s): UACOL, UAPR, USPG, UPH, UTP, UGL, UKET, UBIL, UNIT, UROB, ULEU, UEPI, UWBC, URBC, UBAC, CAST, [place], [place]  Basic Metabolic Panel:  Recent Labs Lab 09/23/2014 1152 09/22/14 0459 09/23/14 0800  NA 132* 132* 129*  K 4.7 4.9 4.6  CL 90* 91* 88*  CO2 30 33* 34*  GLUCOSE 175* 118* 208*  BUN 18 23 23   CREATININE 0.73 0.75 0.56  CALCIUM 8.9 8.7 8.3*   GFR Estimated Creatinine Clearance: 82.9 mL/min (by C-G formula based on Cr of 0.56). Liver Function Tests:  Recent Labs Lab 10/14/2014 1152 09/22/14 0459  AST 18 19  ALT 21 20  ALKPHOS 139* 129*  BILITOT 0.4 0.2*  PROT 6.1 6.0  ALBUMIN 2.4* 2.3*    Recent Labs Lab 09/28/2014 1152  LIPASE 24   No results for input(s): AMMONIA in the last 168 hours. Coagulation profile No results for input(s): INR, PROTIME in the last 168 hours.  CBC:  Recent Labs Lab 10/12/2014 1152 09/22/14 0459 09/23/14 0800  WBC 22.1* 17.6* 16.9*  NEUTROABS 21.0*  --  15.2*  HGB 10.5* 10.4* 10.9*  HCT 33.6* 33.9* 35.0*  MCV 101.8* 103.7* 103.6*  PLT 429* 416* 447*    Studies:  Dg Chest 2  View  09/22/2014   CLINICAL DATA:  Pulmonary edema.  EXAM: CHEST  2 VIEW  COMPARISON:  September 21, 2014.  FINDINGS: Stable cardiomegaly. No pneumothorax is noted. Stable bilateral perihilar and basilar opacities are noted, with right worse than left, consistent with pulmonary edema and associated mild bilateral pleural effusions. Bony thorax is intact.  IMPRESSION: Stable bilateral pulmonary edema with associated pleural effusions.   Electronically Signed   By: Sabino Dick M.D.   On: 09/22/2014 08:25   Dg Chest Port 1 View  09/23/2014   CLINICAL DATA:  Dyspnea.  EXAM: PORTABLE CHEST - 1 VIEW  COMPARISON:  September 22, 2014.  FINDINGS: No pneumothorax is noted. Stable cardiomediastinal silhouette. Increased central pulmonary vascular congestion is noted with probable perihilar edema. Moderate to large right pleural effusion is noted which is increased compared to prior exam. Stable mild to moderate left pleural effusion is noted. Bony thorax is intact.  IMPRESSION: Increased central pulmonary vascular congestion and bilateral pulmonary edema is noted with increased right pleural effusion. Stable moderate left pleural effusion is noted.   Electronically Signed   By: Sabino Dick M.D.   On: 09/23/2014 13:30  Assessment: 63 y.o. female with metastatic gastric cancer, admitted for worsening dyspnea, now has respiratory failure.     Plan:   We again discussed the goal of her cancer care. Due to the rapid disease progression and poor PS, I do not think she will ever be a candidate for chemo. I recommend palliative care alone, and discussed hospice if she is able to go home. Both pt and her husband are very realistic and agree with DNR and DNI, and comfort care as the goal. Her husband wants her to get thoracentesis done if possible to relieve some of her symptoms, no sedation before procedure.    Truitt Merle, MD 09/23/2014  5:07 PM   Office [phone]

## 2014-09-24 ENCOUNTER — Ambulatory Visit: Payer: BC Managed Care – PPO | Admitting: Hematology

## 2014-09-24 DIAGNOSIS — J9602 Acute respiratory failure with hypercapnia: Secondary | ICD-10-CM | POA: Diagnosis present

## 2014-09-24 LAB — BLOOD GAS, ARTERIAL
Acid-Base Excess: 8.2 mmol/L — ABNORMAL HIGH (ref 0.0–2.0)
Bicarbonate: 37.2 mEq/L — ABNORMAL HIGH (ref 20.0–24.0)
DRAWN BY: 11249
Delivery systems: POSITIVE
FIO2: 1 %
LHR: 18 {breaths}/min
Mode: POSITIVE
O2 Saturation: 98.7 %
PCO2 ART: 84.8 mmHg — AB (ref 35.0–45.0)
PO2 ART: 120 mmHg — AB (ref 80.0–100.0)
Patient temperature: 98.5
Pressure control: 12 cmH2O
TCO2: 35.3 mmol/L (ref 0–100)
pH, Arterial: 7.265 — ABNORMAL LOW (ref 7.350–7.450)

## 2014-09-24 MED ORDER — MORPHINE SULFATE 2 MG/ML IJ SOLN
2.0000 mg | INTRAMUSCULAR | Status: AC
  Administered 2014-09-24: 2 mg via INTRAVENOUS

## 2014-09-24 MED ORDER — OSMOLITE 1.2 CAL PO LIQD
1000.0000 mL | ORAL | Status: DC
Start: 2014-09-24 — End: 2014-09-25
  Administered 2014-09-24: 1000 mL
  Filled 2014-09-24: qty 1000

## 2014-09-24 MED ORDER — FUROSEMIDE 10 MG/ML IJ SOLN
40.0000 mg | Freq: Once | INTRAMUSCULAR | Status: AC
  Administered 2014-09-24: 40 mg via INTRAVENOUS
  Filled 2014-09-24: qty 4

## 2014-09-24 MED ORDER — SODIUM CHLORIDE 0.9 % IV SOLN
1.0000 mg/h | INTRAVENOUS | Status: DC
  Administered 2014-09-24: 1 mg/h via INTRAVENOUS
  Filled 2014-09-24: qty 10

## 2014-09-24 MED ORDER — MORPHINE SULFATE 2 MG/ML IJ SOLN
INTRAMUSCULAR | Status: AC
  Filled 2014-09-24: qty 1

## 2014-09-24 NOTE — Progress Notes (Addendum)
Hans, Agricultural consultant, paged this NP secondary to pt having apneic spells on the Bipap. ABG done and NP to bedside after reviewing chart/notes.  S: Pt can not participate in ROS secondary to mental status. Pt's husband says she waved and spoke a little bit earlier today but now opens eyes but "is not there". Per RN, responsive level has decreased over past 2 hours. 2-3 hours ago, pt was rating her pain and following some commands.  O: VS reviewed. ABG reviewed. No other ABGs for comparison but the PCO2 is 85 and given her decreased responsiveness, suspect this is increasing. CXR from today and all MD/NP notes reviewed. Pt appears very acutely ill. On Bipap with apneic spells. Opens eyes widely when stimulated but follows no commands and is non verbal. She appears to have startle reaction only. Lungs with very little air exchange in upper lobes and none otherwise.  A/P: 1. Gastric carcinoma, terminal-husband states oncologist said there was no cure for this situation and recommended DNR and comfort care. 2. Pleural effusions, malignant-appear to be worsening daily. Pt was going to have thoracentesis after Lovenox "washed out" but has become much more ill today and critical care is also recommended DNR/DNI and to consider comfort care.  Long discussion with husband at bedside. He says he and the pt have discussed end of life many times and his wife does not want to be kept alive by machines. Husband states he knows there is "no hope" now of a cure and he doesn't want her to suffer. He agrees to DNR/DNI at this time and we thoroughly discussed "comfort care" and what that means. Discussed the effusions and how she has actually worsened on the bipap and is now mostly unresponsive. I think he is just so shocked of how fast this has all occurred but is extremely reasonable and wants a comfortable and dignified death for his wife. Plan for now is DNR/DNI and start Morphine drip. Then, will check back with husband as he has  expressed removing the bipap mask as well. Will offer chaplain services. Clance Boll, NP [facility] Update: Morphine drip infusing. Pt is comfortable. Still on bipap. Husband still struggling with removing the bipap mask.  Baltazar Najjar

## 2014-09-24 NOTE — Progress Notes (Signed)
PULMONARY / CRITICAL CARE MEDICINE   Name: Sabrina Melton MRN: [id number] DOB: 1951-04-21    ADMISSION DATE:  09/23/2014 CONSULTATION DATE:  09/24/2014  REFERRING MD :  Darrick Meigs, [facility]  CHIEF COMPLAINT:  resp distress  INITIAL PRESENTATION: 63 year old ex-smoker severe OSA and recently diagnosed aggressive, metastatic gastric cancer, we're consulted for respiratory distress, worsening hypoxia and bilateral effusions  STUDIES:  CT chest 08/27/2014- bilateral effusions, moderate pericardial effusion, mild ascites 09/17/14 duplex-right peroneal DVT 08/2013 echo-normal LV function, dilated RV  SIGNIFICANT EVENTS: 08/18/14 stomach biopsy-poorly differentiated adenocarcinoma 08/27/14 laparoscopic staging, peritoneal metastases, J-tube placement   SUBJECTIVE:  Appears comfortable  VITAL SIGNS: Temp:  [97.7 F (36.5 C)-98.6 F (37 C)] 98.5 F (36.9 C) (12/09 0400) Pulse Rate:  [89-113] 101 (12/09 0800) Resp:  [17-32] 19 (12/09 0800) BP: (81-148)/(35-71) 127/71 mmHg (12/09 0800) SpO2:  [94 %-100 %] 100 % (12/09 0800) FiO2 (%):  [50 %-100 %] 100 % (12/09 0819) Weight:  [93.4 kg (205 lb 14.6 oz)] 93.4 kg (205 lb 14.6 oz) (12/09 0400)  100%    VENTILATOR SETTINGS: Vent Mode:  [-] Other (Comment) FiO2 (%):  [50 %-100 %] 100 % Set Rate:  [16 bmp-18 bmp] 18 bmp PEEP:  [5 cmH20] 5 cmH20 Pressure Support:  [8 cmH20] 8 cmH20 INTAKE / OUTPUT:  Intake/Output Summary (Last 24 hours) at 09/24/14 0858 Last data filed at 09/24/14 0800  Gross per 24 hour  Intake 234.85 ml  Output   1170 ml  Net -935.15 ml    PHYSICAL EXAMINATION: General:  Obese, appears chronically ill, remains on BIPAP Neuro:awake and alert; but very weak  HEENT: BIPAP mask in place  Cardiovascular:  S1 and S2 tachycardia Lungs:  Decreased breath sounds bilateral, no rhonchi Abdomen:  Soft, distended, J-tube Musculoskeletal:  Good pulses, 2+ peripheral edema Skin:  Pink, no rash  LABS:  CBC  Recent  Labs Lab 10/14/2014 1152 09/22/14 0459 09/23/14 0800  WBC 22.1* 17.6* 16.9*  HGB 10.5* 10.4* 10.9*  HCT 33.6* 33.9* 35.0*  PLT 429* 416* 447*    Recent Labs Lab 09/25/2014 1152 09/22/14 0459 09/23/14 0800  NA 132* 132* 129*  K 4.7 4.9 4.6  CL 90* 91* 88*  CO2 30 33* 34*  BUN 18 23 23   CREATININE 0.73 0.75 0.56  GLUCOSE 175* 118* 208*  ABG  Recent Labs Lab 09/24/14 0144  PHART 7.265*  PCO2ART 84.8*  PO2ART 120.0*   Imaging Dg Chest Port 1 View  09/23/2014   CLINICAL DATA:  Dyspnea.  EXAM: PORTABLE CHEST - 1 VIEW  COMPARISON:  September 22, 2014.  FINDINGS: No pneumothorax is noted. Stable cardiomediastinal silhouette. Increased central pulmonary vascular congestion is noted with probable perihilar edema. Moderate to large right pleural effusion is noted which is increased compared to prior exam. Stable mild to moderate left pleural effusion is noted. Bony thorax is intact.  IMPRESSION: Increased central pulmonary vascular congestion and bilateral pulmonary edema is noted with increased right pleural effusion. Stable moderate left pleural effusion is noted.   Electronically Signed   By: Sabino Dick M.D.   On: 09/23/2014 13:30     ASSESSMENT / PLAN:   Acute hypoxic & hypercarbic respiratory failure Severe OSA Bilateral pleural effusions-->suspect malignant  Hypertension Echo 2014 normal LV function Advanced gastric cancer Protein calorie malnutrition, status post J-tube RLE DVT Anxiety, chronic pain  Discussion  Respiratory distress and worsening hypoxemia, likely related to advanced malignancy with bilateral effusions, pulmonary embolism less likely but possible. Now transitioning to  comfort. Doubt thora will improve symptom burden enough to justify risk. Would cont morphine gtt, titrate as needed. Eventually transition off BIPAP.   Plan:   Will not offer thora  Morphine gtt BIPAP as long as she tolerates  Hold tube feeds for now Abx: Levaquin12 /6 >   TODAY'S  SUMMARY: - Updates: spoke at length. Made DNR last night by primary team. Transition to comfort.   Erick Colace ACNP-BC [place] Pager # [phone] OR # [phone] if no answer   Care during the described time interval was provided by me and/or other providers on the critical care team.  I have reviewed this patient's available data, including medical history, events of note, physical examination and test results as part of my evaluation Husband has opted for comfort & I agree. Will defer thoracentesis. Can come off BIPAP to O2 face mask after family has had a chance to visit & titrate morphine to comfort.  PCCM to sign off   Rigoberto Noel. MD  09/24/2014, 8:58 AM

## 2014-09-24 NOTE — Progress Notes (Addendum)
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 She was also recently diagnosed with DVT of right peroneal vein and started on therapeutic Lovenox for anticoagulation. Patient discharged from the hospital on 09/09/2014 with antibiotics and prednisone taper. Patient and her husband went to [facility] for second opinion but were told that the prognosis was poor and did not have anything new to offer. No told to follow-up with oncologist in [place]. For the past few weeks she has been progressively short of breath with chest tightness and cough with clear phlegm. In the ED patient was found to have pulmonary edema with moderate pleural effusion and perihilar space opacities and was admitted to medical floor. Patient however has progressive respiratory failure despite diuresis and nebulizers. Patient transferred to ICU and family initially requested full resuscitation. Oncology and PCCM following. Patient placed on BiPAP but became increasingly short of breath with apneic spells overnight. She was also increasingly lethargic after receiving Ativan on 12/8. As per oncology recommendations she has rapid disease progression with poor prognosis and not a candidate for chemotherapy. Recommend palliative care and hospice options. Patient and her husband agreed upon being DO NOT RESUSCITATE/DO NOT INTUBATE with goal for comfort. She is now on morphine drip.  Assessment/Plan: Acute hypoxic respiratory failure Secondary to pulmonary edema and bilateral malignant effusions. Currently on BiPAP and  tolerating well. Goal is for full comfort. Patient is on IV morphine drip. Continue IV Lasix twice a day for diuresis. Continue DuoNeb's as tolerated. -Continue IV Levaquin for now. -Husband requests to avoid benzos at patient very lethargic and sedated with it. Patient and her husband waiting for patient's brother to come from [place] and help in further decision making. -We will hold off on thoracentesis at this time, unless it will significantly improve her symptoms and quality of life.  Metastatic gastric adenocarcinoma As outlined above. Rapid progressing with poor prognosis. Goal for full comfort. On morphine drip. Continue BiPAP for now and transition to nasal cannula if tolerated. Continue to feeds  Hypertension Blood pressure stable. Continue Cardizem 40 mg 3 times a day  Hypothyroidism Significantly elevated TSH to 24.2. Synthroid dose increased.  Leukocytosis Likely secondary to recent prednisone use.  Right lower extremity DVT Lovenox held for possible thoracentesis. Will decide on resuming today. Given plan for full comfort will possibly discontinue.  Code Status: DO NOT RESUSCITATE Family Communication: Husband at bedside Disposition Plan: Pending further hospital course and discussion with patient and her husband. Patient will need hospice.( Home versus residential if she survives this hospitalization)   Consultants:  Oncology  PC CM  Procedures:  None  Antibiotics:  Levaquin  HPI/Subjective: Patient seen and examined. Overnight she had Spells on BiPAP. ABG showing hypercapnic respiratory failure. She was also more drowsy. After long discussion overnight by NP with patient and her husband she is now made DO NOT RESUSCITATE with goal for comfort. Started on morphine drip.  Objective: Filed Vitals:   09/24/14 0600  BP: 102/54  Pulse: 95  Temp:   Resp: 17    Intake/Output Summary (Last 24 hours) at 09/24/14 0817 Last data filed at 09/24/14 0600   Gross per 24 hour  Intake  211.85 ml  Output   1170 ml  Net -958.15 ml   Filed Weights   10/02/2014 1500 09/23/14 0518 09/24/14 0400  Weight: 88.451 kg (195 lb) 91 kg (200 lb 9.9 oz) 93.4 kg (205 lb 14.6 oz)    Exam:   General:  Middle aged female lying in bed on BiPAP, awake and alert  Cardiovascular: Normal S1 and S2, no murmurs  Respiratory: Numbness bilateral breath sounds  Abdomen: , Nondistended, nontender, G-tube in place, Foley in place  Musculoskeletal: Warm, bilateral edema  CNS: Alert and awake, extremely fatigued, oriented.  Data Reviewed: Basic Metabolic Panel:  Recent Labs Lab 09/19/2014 1152 09/22/14 0459 09/23/14 0800  NA 132* 132* 129*  K 4.7 4.9 4.6  CL 90* 91* 88*  CO2 30 33* 34*  GLUCOSE 175* 118* 208*  BUN 18 23 23   CREATININE 0.73 0.75 0.56  CALCIUM 8.9 8.7 8.3*   Liver Function Tests:  Recent Labs Lab 10/04/2014 1152 09/22/14 0459  AST 18 19  ALT 21 20  ALKPHOS 139* 129*  BILITOT 0.4 0.2*  PROT 6.1 6.0  ALBUMIN 2.4* 2.3*    Recent Labs Lab 10/01/2014 1152  LIPASE 24   No results for input(s): AMMONIA in the last 168 hours. CBC:  Recent Labs Lab 10/06/2014 1152 09/22/14 0459 09/23/14 0800  WBC 22.1* 17.6* 16.9*  NEUTROABS 21.0*  --  15.2*  HGB 10.5* 10.4* 10.9*  HCT 33.6* 33.9* 35.0*  MCV 101.8* 103.7* 103.6*  PLT 429* 416* 447*   Cardiac Enzymes:  Recent Labs Lab 09/20/2014 1152  TROPONINI <0.30   BNP (last 3 results)  Recent Labs  09/20/2014 1153  PROBNP 883.1*   CBG: No results for input(s): GLUCAP in the last 168 hours.  Recent Results (from the past 240 hour(s))  MRSA PCR Screening     Status: None   Collection Time: 09/23/14 12:37 PM  Result Value Ref Range Status   MRSA by PCR NEGATIVE NEGATIVE Final    Comment:        The GeneXpert MRSA Assay (FDA approved for NASAL specimens only), is one component of a comprehensive MRSA colonization surveillance program. It is not intended to diagnose  MRSA infection nor to guide or monitor treatment for MRSA infections.      Studies: Dg Chest Port 1 View  09/23/2014   CLINICAL DATA:  Dyspnea.  EXAM: PORTABLE CHEST - 1 VIEW  COMPARISON:  September 22, 2014.  FINDINGS: No pneumothorax is noted. Stable cardiomediastinal silhouette. Increased central pulmonary vascular congestion is noted with probable perihilar edema. Moderate to large right pleural effusion is noted which is increased compared to prior exam. Stable mild to moderate left pleural effusion is noted. Bony thorax is intact.  IMPRESSION: Increased central pulmonary vascular congestion and bilateral pulmonary edema is noted with increased right pleural effusion. Stable moderate left pleural effusion is noted.   Electronically Signed   By: Sabino Dick M.D.   On: 09/23/2014 13:30    Scheduled Meds: . diltiazem  40 mg Oral TID  . furosemide  40 mg Intravenous 3 times per day  . levofloxacin (LEVAQUIN) IV  500 mg Intravenous Q24H  . levothyroxine  150 mcg Oral QAC breakfast  . metoCLOPramide  10 mg Per Tube TID  . morphine      . neomycin-bacitracin-polymyxin  1 application Topical BID  . ranitidine  150 mg Per Tube BID  . sodium chloride  3 mL Intravenous Q12H  . sodium chloride  3 mL Intravenous  Q12H   Continuous Infusions: . morphine 1 mg/hr (09/24/14 0309)      Time spent: 35 minutes    Lita Flynn, [place] [facility] Pager [phone] If 7PM-7AM, please contact night-coverage at [email or web address], password [facility] 09/24/2014, 8:17 AM  LOS: 3 days

## 2014-09-25 DIAGNOSIS — Z66 Do not resuscitate: Secondary | ICD-10-CM

## 2014-09-25 DIAGNOSIS — Z515 Encounter for palliative care: Secondary | ICD-10-CM

## 2014-09-25 MED ORDER — SCOPOLAMINE 1 MG/3DAYS TD PT72
1.0000 | MEDICATED_PATCH | TRANSDERMAL | Status: DC
  Administered 2014-09-25: 1.5 mg via TRANSDERMAL
  Filled 2014-09-25: qty 1

## 2014-09-25 MED ORDER — VITAMINS A & D EX OINT
TOPICAL_OINTMENT | CUTANEOUS | Status: AC
  Filled 2014-09-25: qty 5

## 2014-09-25 MED ORDER — ATROPINE SULFATE 1 % OP SOLN
4.0000 [drp] | OPHTHALMIC | Status: DC | PRN
  Administered 2014-09-25: 4 [drp] via SUBLINGUAL
  Filled 2014-09-25: qty 2

## 2014-09-25 MED ORDER — LORAZEPAM 2 MG/ML IJ SOLN: 1.0000 mg | INTRAMUSCULAR | Status: DC | PRN

## 2014-09-25 NOTE — Progress Notes (Signed)
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 She was also recently diagnosed with DVT of right peroneal vein and started on therapeutic Lovenox for anticoagulation. Patient discharged from the hospital on 09/09/2014 with antibiotics and prednisone taper. Patient and her husband went to [facility] for second opinion but were told that the prognosis was poor and did not have anything new to offer. No told to follow-up with oncologist in [place]. For the past few weeks prior to admission, she has been progressively short of breath, chest tightness and cough with clear phlegm. In the ED patient was found to have pulmonary edema with moderate pleural effusion and perihilar space opacities and was admitted to medical floor. Patient continued to have progressive respiratory failure despite diuresis and nebulizers. Patient was transferred to ICU and family initially requested full resuscitation. Oncology and PCCM were consulted. Patient was placed on BiPAP but did not tolerate it, became increasingly short of breath with apneic spells overnight. She was also increasingly lethargic after receiving Ativan on 12/8. As per oncology recommendations she has rapid disease progression with poor prognosis and not a candidate for chemotherapy and recommended palliative care and hospice options. Pulmonary critical care was consulted for worsening hypoxia, bilateral pleural effusions, recommended no thoracentesis, as likely the effusions aren't malignant due to advanced gastric adenocarcinoma,  recommended comfort care. Patient and her husband agreed upon being DNR/DNI with goal for comfort. She is now on morphine drip. Palliative medicine consulted on 12/10 for symptom control and possible residential hospice  Assessment/Plan: Acute hypoxic respiratory failure; secondary to pulmonary edema and bilateral malignant effusions. - Off BiPAP, currently on NRB mask, goal is for full comfort care - Currently on IV morphine drip - Continue IV Lasix and DuoNeb's as tolerated - Currently on IV Levaquin  - No thoracentesis per pulmonary as effusions are likely malignant due to advance gastric adenocarcinoma, metastatic, recommended comfort care  - Discussed in detail with patient's husband about palliative medicine, agreeable to hospice, palliative medicine consult placed for symptom control, may be candidate for residential hospice - Currently on trickle tube feeds 10cc/hr per husband's request   Metastatic gastric adenocarcinoma Rapid progressing with poor prognosis. Goal for full comfort. On morphine drip.   Hypertension Blood pressure stable. Continue Cardizem 40 mg 3 times a day  Hypothyroidism Significantly elevated TSH to 24.2. Synthroid dose increased.  Leukocytosis Likely secondary to recent prednisone use.  Right lower extremity DVT Goal for full comfort care, no Lovenox at this time  Code Status: DO NOT RESUSCITATE, DO NOT RESUSCITATE  Family Communication: Discussed with patient's husband at bedside   Disposition Plan: Transfer to MedSurg/palliative floor   Consultants:  Oncology  PCCM  Palliative medicine, consulted 12/10  Procedures:  None  Antibiotics:  Levaquin  Subjective:  Patient seen and examined, on NRB mask, her husband had a good night, per patient, pain controlled, "up and down" levels with pain.   Objective: Filed Vitals:   09/25/14 0800  BP: 118/62  Pulse: 108  Temp:   Resp: 10    Intake/Output Summary (Last 24 hours) at  09/25/14 0912 Last data filed at 09/25/14 0800  Gross per 24 hour  Intake 854.38 ml  Output   1220 ml  Net -365.62 ml   Filed Weights   10/08/2014 1500 09/23/14 0518 09/24/14 0400  Weight: 88.451 kg (195 lb) 91 kg (200 lb 9.9 oz) 93.4 kg (205 lb 14.6 oz)    Exam:   General:A xOx3, NAD  CVS: Normal S1 and S2, no mrg  Chest: Decreased breath sounds, no rhonchi  Abd: soft, NT, ND, G-tube +, Foley in place  Ext: no c/c, + edema  Neuro:  No focal neurological deficits  Data Reviewed: Basic Metabolic Panel:  Recent Labs Lab 10/05/2014 1152 09/22/14 0459 09/23/14 0800  NA 132* 132* 129*  K 4.7 4.9 4.6  CL 90* 91* 88*  CO2 30 33* 34*  GLUCOSE 175* 118* 208*  BUN 18 23 23   CREATININE 0.73 0.75 0.56  CALCIUM 8.9 8.7 8.3*   Liver Function Tests:  Recent Labs Lab 09/19/2014 1152 09/22/14 0459  AST 18 19  ALT 21 20  ALKPHOS 139* 129*  BILITOT 0.4 0.2*  PROT 6.1 6.0  ALBUMIN 2.4* 2.3*    Recent Labs Lab 10/11/2014 1152  LIPASE 24   No results for input(s): AMMONIA in the last 168 hours. CBC:  Recent Labs Lab 09/24/2014 1152 09/22/14 0459 09/23/14 0800  WBC 22.1* 17.6* 16.9*  NEUTROABS 21.0*  --  15.2*  HGB 10.5* 10.4* 10.9*  HCT 33.6* 33.9* 35.0*  MCV 101.8* 103.7* 103.6*  PLT 429* 416* 447*   Cardiac Enzymes:  Recent Labs Lab 10/13/2014 1152  TROPONINI <0.30   BNP (last 3 results)  Recent Labs  10/14/2014 1153  PROBNP 883.1*   CBG: No results for input(s): GLUCAP in the last 168 hours.  Recent Results (from the past 240 hour(s))  MRSA PCR Screening     Status: None   Collection Time: 09/23/14 12:37 PM  Result Value Ref Range Status   MRSA by PCR NEGATIVE NEGATIVE Final    Comment:        The GeneXpert MRSA Assay (FDA approved for NASAL specimens only), is one component of a comprehensive MRSA colonization surveillance program. It is not intended to diagnose MRSA infection nor to guide or monitor treatment for MRSA infections.       Studies: Dg Chest Port 1 View  09/23/2014   CLINICAL DATA:  Dyspnea.  EXAM: PORTABLE CHEST - 1 VIEW  COMPARISON:  September 22, 2014.  FINDINGS: No pneumothorax is noted. Stable cardiomediastinal silhouette. Increased central pulmonary vascular congestion is noted with probable perihilar edema. Moderate to large right pleural effusion is noted which is increased compared to prior exam. Stable mild to moderate left pleural effusion is noted. Bony thorax is intact.  IMPRESSION: Increased central pulmonary vascular congestion and bilateral pulmonary edema is noted with increased right pleural effusion. Stable moderate left pleural effusion is noted.   Electronically Signed   By: Sabino Dick M.D.   On: 09/23/2014 13:30    Scheduled Meds: . diltiazem  40 mg Oral TID  . furosemide  40 mg Intravenous 3 times per day  . levofloxacin (LEVAQUIN) IV  500 mg Intravenous Q24H  . levothyroxine  150 mcg Oral QAC breakfast  . metoCLOPramide  10 mg Per Tube TID  . neomycin-bacitracin-polymyxin  1 application Topical BID  . ranitidine  150 mg Per Tube BID  . sodium chloride  3 mL Intravenous Q12H  . sodium chloride  3 mL Intravenous Q12H   Continuous Infusions: . feeding supplement (OSMOLITE 1.2 CAL) 1,000 mL (09/24/14 1900)  .  morphine 3 mg/hr (09/25/14 0708)       Demontrez Rindfleisch M.D. [facility] 09/25/2014, 9:13 AM  Pager: [phone]

## 2014-09-25 NOTE — Consult Note (Signed)
Patient VO:ZDGUY K Gallion      DOB: 08-Jan-1951      QIH:[id number]     Consult Note from the Palliative Medicine Team at [facility] Requested by: Dr Tana Coast     PCP: Lucretia Kern., DO Reason for Consultation:  Clarification of [place] and options   Phone Number:[phone]  Assessment of patients Current state:   Rapid physical, funtional and cognitive decline 2/2 metastatic gastric cancer.  Patient and family faced with advanced directive decisions and anticipatory care needs.  Consult is for review of medical treatment options, clarification of goals of care and end of life issues, disposition and options, and symptom recommendation.  This NP Wadie Lessen reviewed medical records, received report from team, assessed the patient and then meet at the patient's bedside along with her husband  to discuss diagnosis prognosis, GOC, EOL wishes disposition and options.  A detailed discussion was had today regarding advanced directives.  Concepts specific to code status, artifical feeding and hydration, continued IV antibiotics and rehospitalization was had.  The difference between a aggressive medical intervention path  and a palliative comfort care path for this patient at this time was had.  Values and goals of care important to patient and family were attempted to be elicited.  Concept of Hospice and Palliative Care were discussed  Natural trajectory and expectations at EOL were discussed.  Questions and concerns addressed.  Hard Choices booklet left for review. Family encouraged to call with questions or concerns.  PMT will continue to support holistically.   Goals of Care:  Focus of care is full comfort enhancing quality and dignity.  No further artifical feeding or hydration, or diagnostics.   Aggressive symptom management to enhance comfort and dignity.    Disposition:   Hopeful for in patient hospice facility, will write for choice.  Prognosis is likely days, less than a  week   Symptom Management:    Anxiety/Agitation:  Ativan 1 mg IV every 4 hrs prn  Pain/Dyspnea: Morphine continuous gtt may titrate comfort   Psychosocial: Emotional support offered to patient and husband at bedside.  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 She was also recently diagnosed with DVT of right peroneal vein and started on therapeutic Lovenox for anticoagulation. Patient discharged from the hospital on 09/09/2014 with antibiotics and prednisone taper. Patient and her husband went to [facility] for second opinion but were told that the prognosis was poor and did not have anything new to offer. No told to follow-up with oncologist in [place]. For the past few weeks she has been progressively short of breath with chest tightness and cough with clear phlegm. In the ED patient was found to have pulmonary edema with moderate pleural effusion and perihilar space opacities and was admitted to medical floor. Patient however has progressive respiratory failure despite diuresis and nebulizers. Patient transferred to ICU and family initially requested full resuscitation. Oncology and PCCM following. Patient placed on BiPAP but became increasingly short of breath with apneic spells overnight. She was also increasingly lethargic after receiving Ativan on 12/8. As per oncology recommendations she  has rapid disease progression with poor prognosis and not a candidate for chemotherapy. Recommend palliative care and hospice options. Patient and her husband agreed upon being DO NOT  RESUSCITATE/DO NOT INTUBATE. She is on a morphine gtt at this time.  Decision for full comfort and hope for inpatient hospice facility    ROS:  Unable to illicit, patient is letahrgic   PMH:  Past Medical History  Diagnosis Date  . 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 Hypothyroidism   . Obstructive sleep apnea 05/21/09    USES CPAP  . LIVER FUNCTION TESTS, ABNORMAL, HX OF 01/30/2009    Qualifier: Diagnosis of  By: Harrington Challenger, MD, [place]   . PONV (postoperative nausea and vomiting)   . Hypertension   . Depression   . GERD (gastroesophageal reflux disease)   . Leiomyoma 2015    Multiple small on ultrasound, largest 24 mm  . Hodgkin disease     HISTORY OF  . Stomach cancer      PSH: Past Surgical History  Procedure Laterality Date  . Splenectomy, total  1977  . Tonsillectomy    . Open reduction and internal fixation of tibial shaft fracture with ext into the plafond  03/29/07    Angelena Form, M.D. [facility])  . Dilation and curettage of uterus    . Colposcopy    . Gynecologic cryosurgery    . Thyroid lobectomy Left 03/14/2013    Dr Constance Holster  . Thyroidectomy N/A 03/14/2013    Procedure: LEFT THYROID LOBECTOMY WITH FROZEN SECTION;  Surgeon: Izora Gala, MD;  Location: [facility];  Service: ENT;  Laterality: N/A;  . Hysteroscopy  2010    HYSTEROSCOPY,D&C FOR PMB AND ENDO POLYP  . Cesarean section  1979  . Laparoscopy N/A 08/27/2014    Procedure: Exploratory Laparoscopy,  abdominal biopsy and j tube placement;  Surgeon: Leighton Ruff, MD;  Location: [facility];  Service: General;  Laterality: N/A;   I have reviewed the [place] and SH and  If appropriate update it with new information. No Known Allergies Scheduled Meds: . furosemide  40 mg Intravenous 3 times per day  . neomycin-bacitracin-polymyxin  1 application Topical BID  . sodium chloride  3 mL Intravenous Q12H  . sodium chloride  3 mL Intravenous Q12H   Continuous Infusions: . morphine 3 mg/hr (09/25/14 0708)   PRN  Meds:.[DISCONTINUED] acetaminophen **OR** acetaminophen, levalbuterol, LORazepam, ondansetron **OR** ondansetron (ZOFRAN) IV, sodium chloride    BP 118/62 mmHg  Pulse 108  Temp(Src) 97.6 F (36.4 C) (Axillary)  Resp 10  Ht 5\' 6"  (1.676 m)  Wt 93.4 kg (205 lb 14.6 oz)  BMI 33.25 kg/m2  SpO2 93%   PPS:20 % at best   Intake/Output Summary (Last 24 hours) at 09/25/14 0928 Last data filed at 09/25/14 0800  Gross per 24 hour  Intake 854.38 ml  Output   1220 ml  Net -365.62 ml    Physical Exam:  General: ill appearing, NAD Chest:   Diminished throughout CVS: tachycardic Ext: BLE  +3 edema Neuro: letahrgic  Labs: CBC    Component Value Date/Time   WBC 16.9* 09/23/2014 0800   RBC 3.38* 09/23/2014 0800   HGB 10.9* 09/23/2014 0800   HCT 35.0* 09/23/2014 0800   PLT 447* 09/23/2014 0800   MCV 103.6* 09/23/2014 0800   MCH 32.2 09/23/2014 0800   MCHC 31.1 09/23/2014 0800   RDW 14.9 09/23/2014 0800   LYMPHSABS 1.1 09/23/2014 0800   MONOABS 0.5 09/23/2014 0800   EOSABS  0.1 09/23/2014 0800   BASOSABS 0.0 09/23/2014 0800    BMET    Component Value Date/Time   NA 129* 09/23/2014 0800   K 4.6 09/23/2014 0800   CL 88* 09/23/2014 0800   CO2 34* 09/23/2014 0800   GLUCOSE 208* 09/23/2014 0800   BUN 23 09/23/2014 0800   CREATININE 0.56 09/23/2014 0800   CALCIUM 8.3* 09/23/2014 0800   GFRNONAA >90 09/23/2014 0800   GFRAA >90 09/23/2014 0800    CMP     Component Value Date/Time   NA 129* 09/23/2014 0800   K 4.6 09/23/2014 0800   CL 88* 09/23/2014 0800   CO2 34* 09/23/2014 0800   GLUCOSE 208* 09/23/2014 0800   BUN 23 09/23/2014 0800   CREATININE 0.56 09/23/2014 0800   CALCIUM 8.3* 09/23/2014 0800   PROT 6.0 09/22/2014 0459   ALBUMIN 2.3* 09/22/2014 0459   AST 19 09/22/2014 0459   ALT 20 09/22/2014 0459   ALKPHOS 129* 09/22/2014 0459   BILITOT 0.2* 09/22/2014 0459   GFRNONAA >90 09/23/2014 0800   GFRAA >90 09/23/2014 0800    Time In Time Out Total Time Spent  with Patient Total Overall Time  0845 1000 70 min 75 min    Greater than 50%  of this time was spent counseling and coordinating care related to the above assessment and plan.   Wadie Lessen NP  Palliative Medicine Team Team Phone # [phone] Pager [phone]  Discussed with Dr Tana Coast

## 2014-09-25 NOTE — Progress Notes (Signed)
Attempted to call report to 3W 

## 2014-09-25 NOTE — Progress Notes (Signed)
Clinical Social Work Department BRIEF PSYCHOSOCIAL ASSESSMENT 09/25/2014  Patient:  Sabrina Melton, Sabrina Melton     Account Number:  [id number]     Admit date:  09/27/2014  Clinical Social Worker:  Maryln Manuel  Date/Time:  09/25/2014 01:20 PM  Referred by:  Physician  Date Referred:  09/25/2014 Referred for  Residential hospice placement   Other Referral:   Interview type:  Family Other interview type:    PSYCHOSOCIAL DATA Living Status:  HUSBAND Admitted from facility:   Level of care:   Primary support name:  Collier Salina Doebler/spouse/[phone] Primary support relationship to patient:  SPOUSE Degree of support available:   strong    CURRENT CONCERNS Current Concerns  Post-Acute Placement   Other Concerns:    SOCIAL WORK ASSESSMENT / PLAN CSW received referral for residential hospice placement.    CSW met with pt and pt husband at bedside. Pt resting comfortably on non-rebreather at this time. CSW introduced self and explained role. CSW discussed that CSW had been contacted by Palliative medicine team nurse practitioner following goals of care this morning and notifed CSW of recommendation for residential hospice placement. Pt spouse expressed understanding and anticipated CSW visit. CSW offered choice of residential hospice facilities and pt spouse stated that preference would be [facility]. CSW provided support to pt spouse while discussing residential hospice.    CSW made referral to [facility], Erling Conte.    CSW awaiting response from [facility].    CSW to continue to follow to provide support and assist with pt disposition needs.   Assessment/plan status:  Psychosocial Support/Ongoing Assessment of Needs Other assessment/ plan:   discharge planning   Information/referral to community resources:   Residential Hospice list    PATIENT'S/FAMILY'S RESPONSE TO PLAN OF CARE: Pt currently on non-rebreather. Pt spouse supportive and actively involved in  pt care. Pt spouse very hopeful for [facility] and eager to hear if [facility] has any availability.    Alison Murray, MSW, [place] Work [phone]

## 2014-09-25 NOTE — Progress Notes (Signed)
CSW continuing to follow for disposition planning.  CSW received notification from [facility], Erling Conte that pt has bed available at [facility] 10/05/14 as long as transfer makes sense at that time.   CSW notified pt husband, Collier Salina at bedside. Pt husband was very relieved to know that [facility] bed available tomorrow.   CSW to continue to follow to provide support and assist with pt transition to [facility].  Alison Murray, MSW, [place] Work [phone]

## 2014-09-25 NOTE — Progress Notes (Signed)
Nutrition Brief Note   Chart reviewed. Pt now transitioning to comfort care.  No further nutrition interventions warranted at this time.  Please re-consult as needed.   Jaycelyn Orrison F Foster Frericks MS RD LDN Clinical Dietitian Pager:[phone]    

## 2014-09-26 DIAGNOSIS — Z515 Encounter for palliative care: Secondary | ICD-10-CM

## 2014-09-30 ENCOUNTER — Encounter (HOSPITAL_COMMUNITY): Payer: Self-pay

## 2014-10-02 ENCOUNTER — Encounter (HOSPITAL_COMMUNITY): Payer: Self-pay

## 2014-10-17 NOTE — Progress Notes (Signed)
Patient was pronounced dead at approximately 0550. Spouse was at bedside. CDS called with reference # [id number]. Sp[facility] with [facility]hena Roys at [facility] and patient is a candidate for eye donation. Reference # [id number].

## 2014-10-17 NOTE — Consult Note (Signed)
[facility] Liaison: [facility] room available today if transfer still makes sense. Completed paperwork with spouse yesterday after receiving insurance authorization. Dr. Orpah Melter to assume care per spouse's choice. Please fax discharge summary to [phone]. RN please call report to [phone]. Please arrange transport for patient to arrive before noon.   Spouse very complimentary of hospital staff and appreciative of supportive conversations related to approaching end of life.   Thank you. Erling Conte LCSW [phone]

## 2014-10-17 NOTE — Discharge Summary (Signed)
Expiration Note/ Death Summary  Sabrina Melton  MR#: [id number]  DOB:01/20/1951  Date of Admission: 10-02-14 Date of Death: 10/07/14  Attending 41  Patient's PCP: Lucretia Kern., DO  Consults: Treatment Team:  Palliative [facility] critical care, Dr Elsworth [facility] Oncology, Dr. 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 acute diastolic heart failure . Signet-ring cell carcinoma of stomach . Essential hypertension . Dyspnea . Protein-calorie malnutrition, severe . 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 Patient was admitted for J-tube placement when when the biopsy from the diaphragm revealed metastasis to the peritoneum.  Patient was discharged from the hospital on 09/09/14 with antibiotics and prednisone taper. Patient had completed her prednisone taper per day before. Patient and her husband went to [facility] for a second opinion, where they were told to follow oncology at [facility]. She was also recently diagnosed with DVT of the right peroneal vein and started on anticoagulation with therapeutic Lovenox. Over the past few weeks, patient had been getting progressively short of breath. She had home oxygen which was prescribed during the previous admission. As per patient, she was getting more short of breath on exertion, feeling tight on the chest and had been coughing up clear phlegm. She was getting the Osmolite through the G-tube. In ED she was found to have pulmonary edema, BNP elevated at 883. Chest x-ray revealed moderate pleural effusions and  perihilar space opacities indicating progressive pulmonary edema. Patient was admitted for further workup.  Hospital Course: Acute hypoxic respiratory failure with hypoxia and hypercapnia. The patient was admitted from the ED with pulmonary  edema, moderate pleural effusions, perihilar space opacities. Patient continued to have progressive respiratory failure despite diuresis and nebulizers. She was transferred to ICU. Her family initially requested full resuscitation. Oncology and PCCM were consulted. Patient was placed on BiPAP but she did not tolerate it, became increasingly short of breath with apneic spells. As per oncology recommendations, Dr. Burr Medico, she had rapid disease progression with very poor prognosis, not a candidate for chemotherapy and recommended palliative care and hospice options. Pulmonary critical care was consulted for worsening hypoxia, bilateral pleural effusions, recommended no thoracentesis as likely the effusions were malignant due to advanced gastric adenocarcinoma, recommended a comfort care. Patient and her husband requested for DO NOT RESUSCITATE status. Palliative medicine consult was also obtained, patient was placed on morphine drip for comfort  and respiratory distress. Plan was to discharge her to residential hospice, [facility] with comfort care goals, however patient passed at 5:50 AM on 10-07-2014.  Metastatic advanced gastric adenocarcinoma As per above, per oncology, patient was not a candidate for chemotherapy, very poor prognosis and was recommended hospice at this time.  SignedEstill Cotta M.D. [facility] 07-Oct-2014, 9:49 AM Pager: [phone]

## 2014-10-17 NOTE — Progress Notes (Signed)
Discharge summary sent to payer through [employer]  

## 2014-10-17 DEATH — deceased

## 2014-10-22 ENCOUNTER — Ambulatory Visit: Payer: BC Managed Care – PPO | Admitting: Family Medicine

## 2014-12-23 IMAGING — CR DG CHEST 2V
2 series · 2 of 2 positions shown · non-contrast
Comparison: 09/17/2014

CLINICAL DATA: Shortness of breath for 3 days, gastric cancer per
patient

EXAM:
CHEST  2 VIEW

[w chest lat]
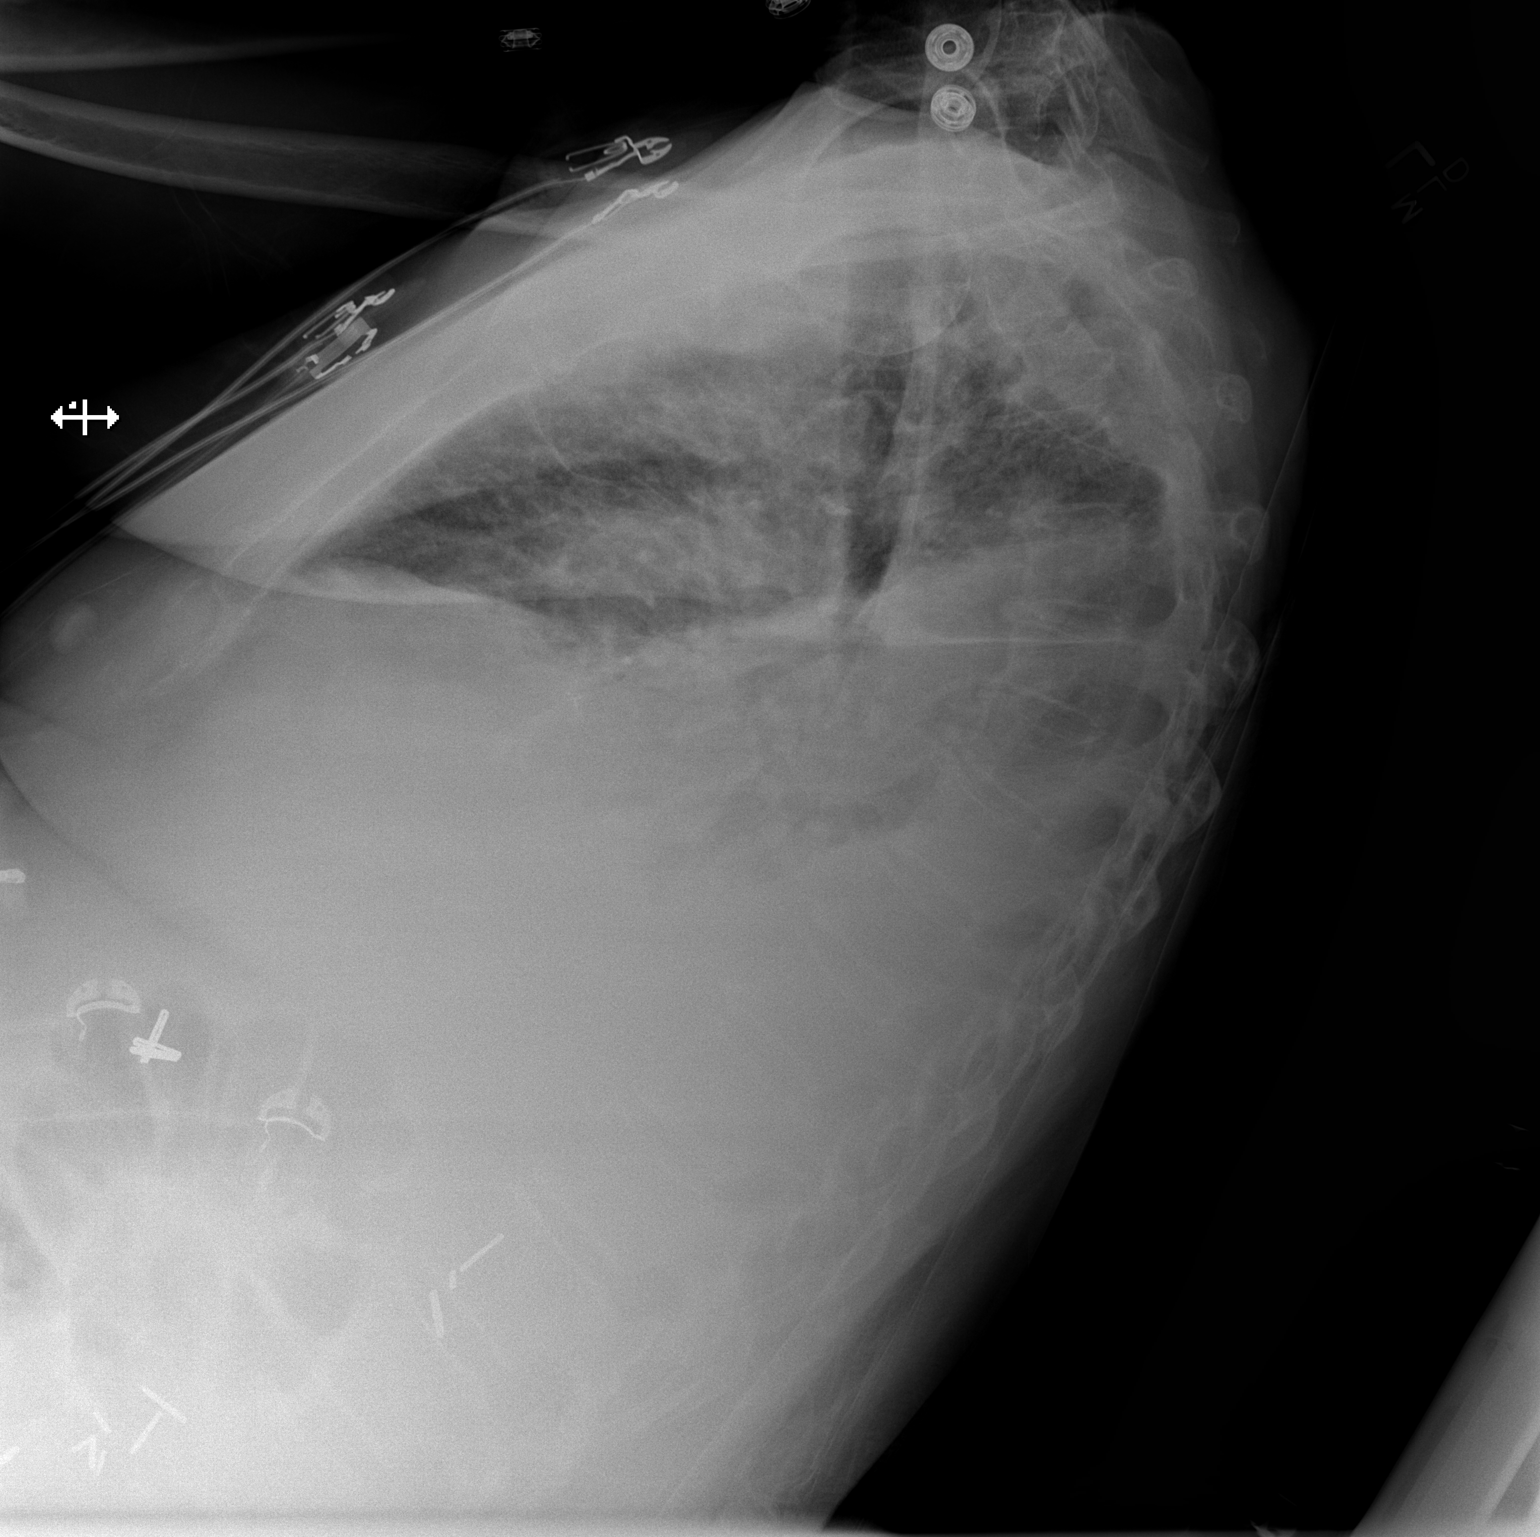

[x chest ap]
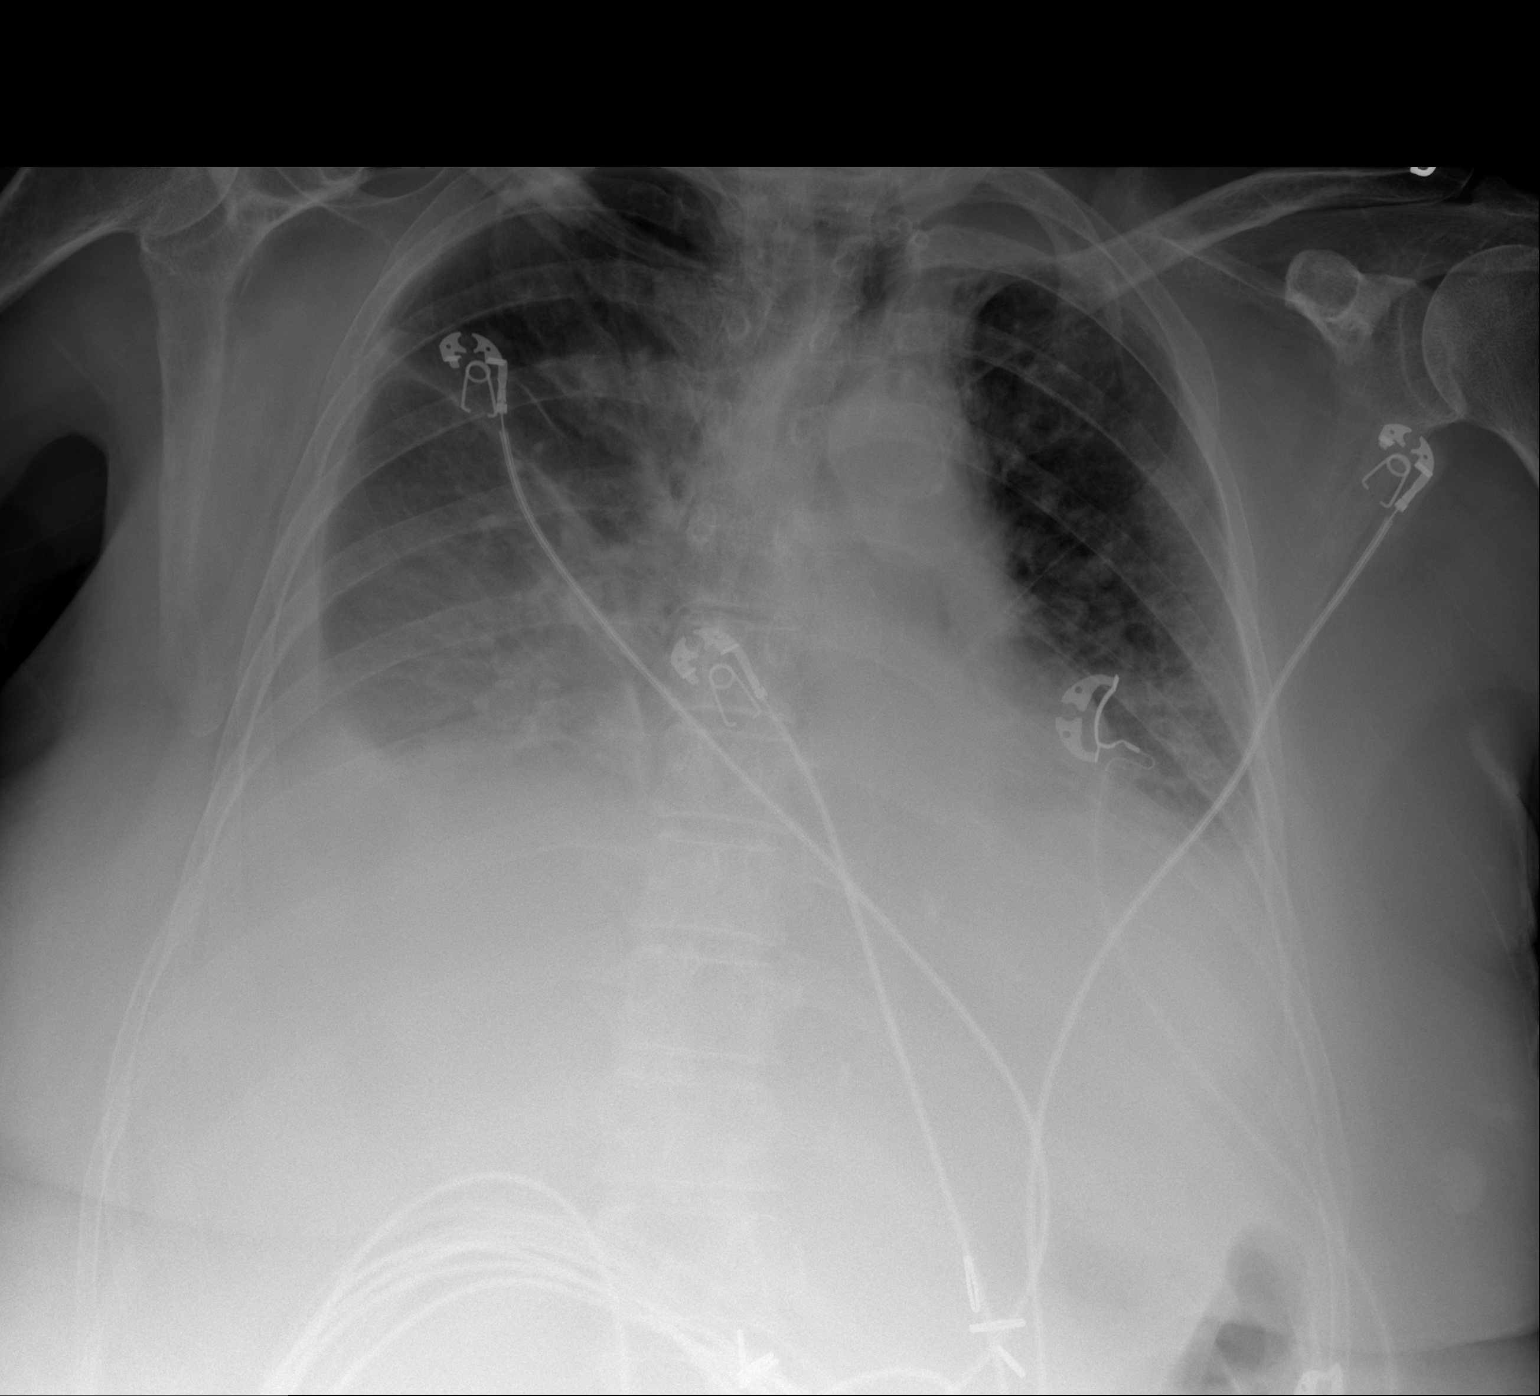

[2 of 2 positions shown; findings below may reference images not displayed]

FINDINGS: Increased now moderate pleural effusions with new hazy perihilar
airspace opacities likely indicating worsening pulmonary edema.
Moderate cardiomegaly persists. Clips project over the lower
abdomen.
IMPRESSION: Increased moderate pleural effusions and perihilar airspace
opacities likely indicating progressive pulmonary edema.

## 2014-12-24 IMAGING — CR DG CHEST 2V
2 series · 2 of 2 positions shown · non-contrast
Comparison: September 21, 2014.

CLINICAL DATA: Pulmonary edema.

EXAM:
CHEST  2 VIEW

[w chest lat]
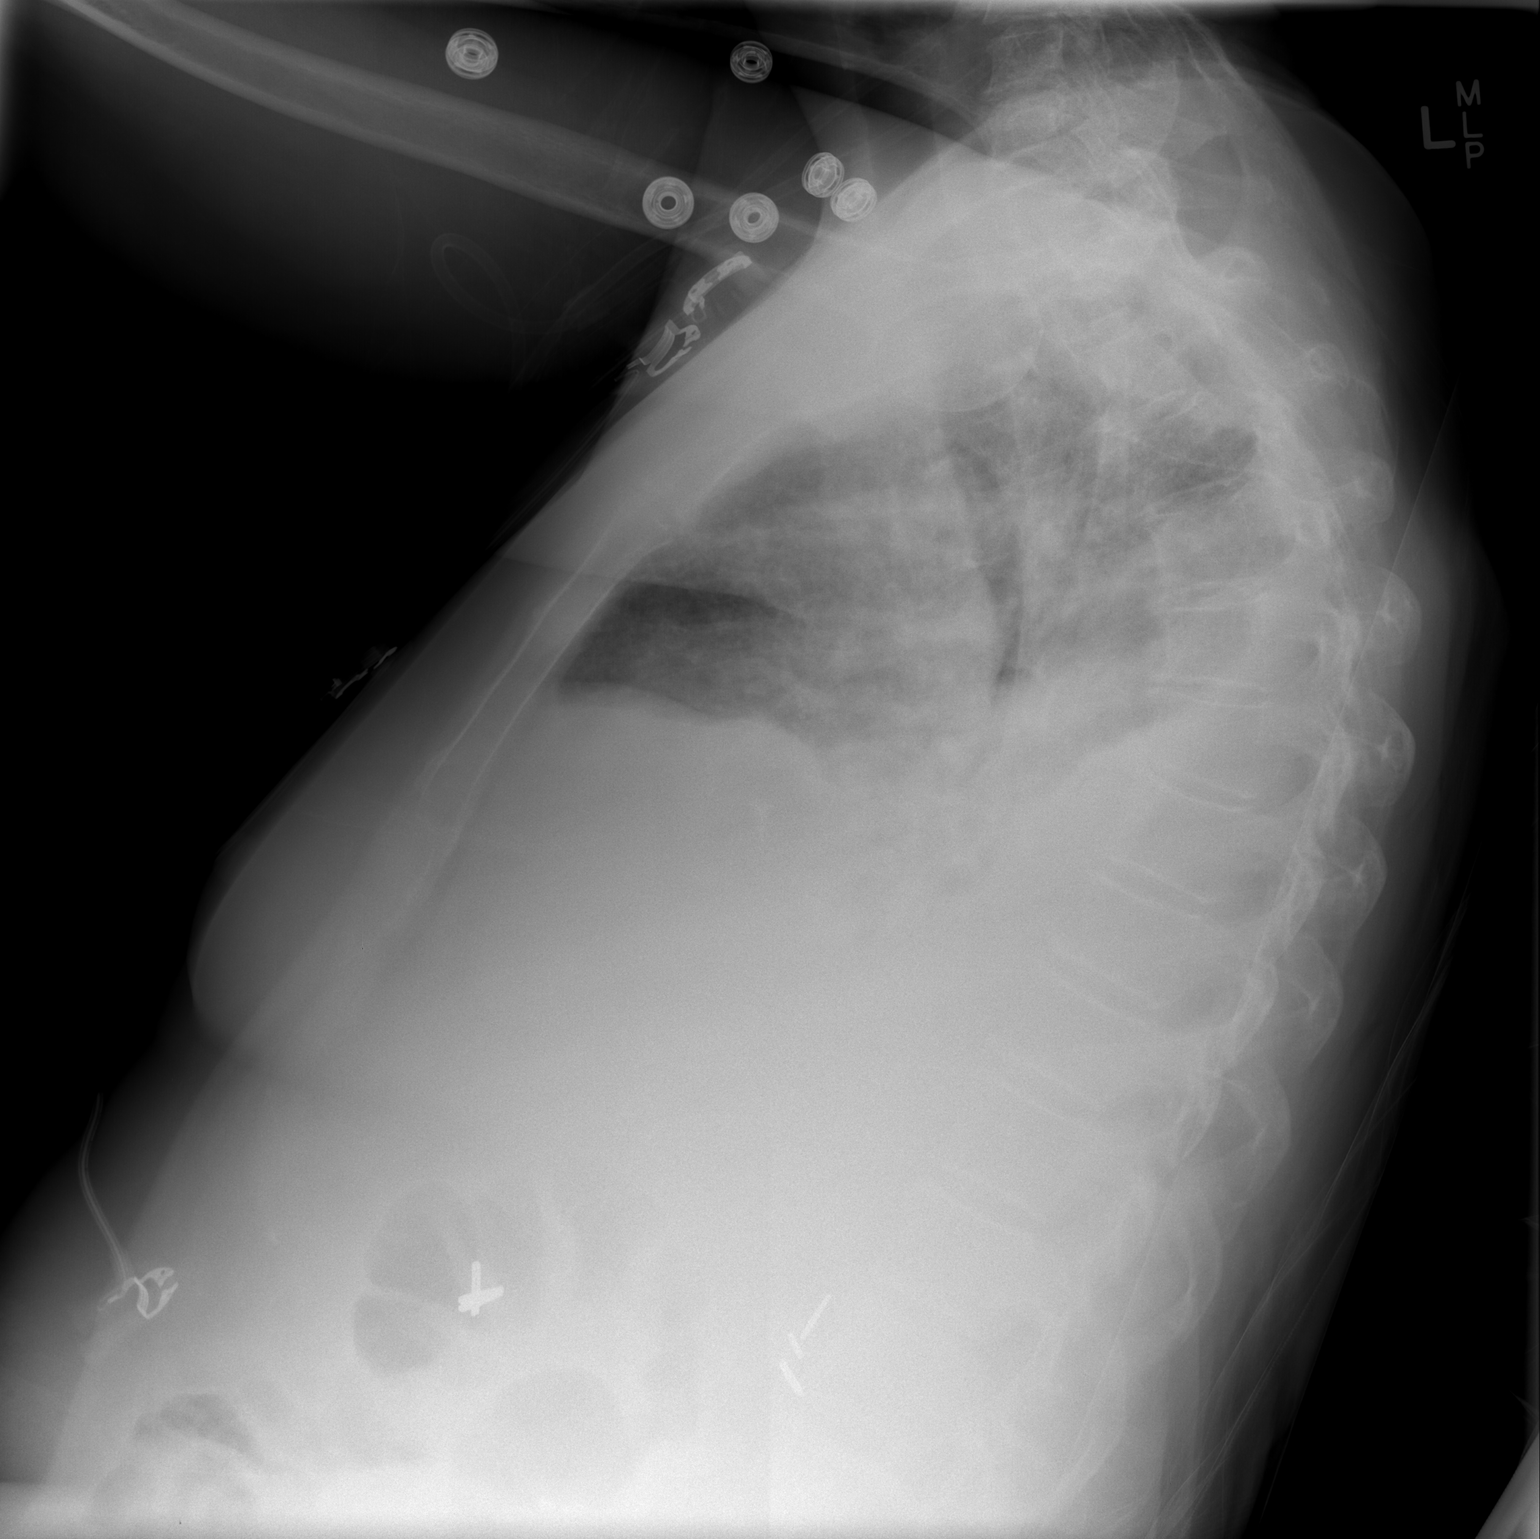

[view not recorded]
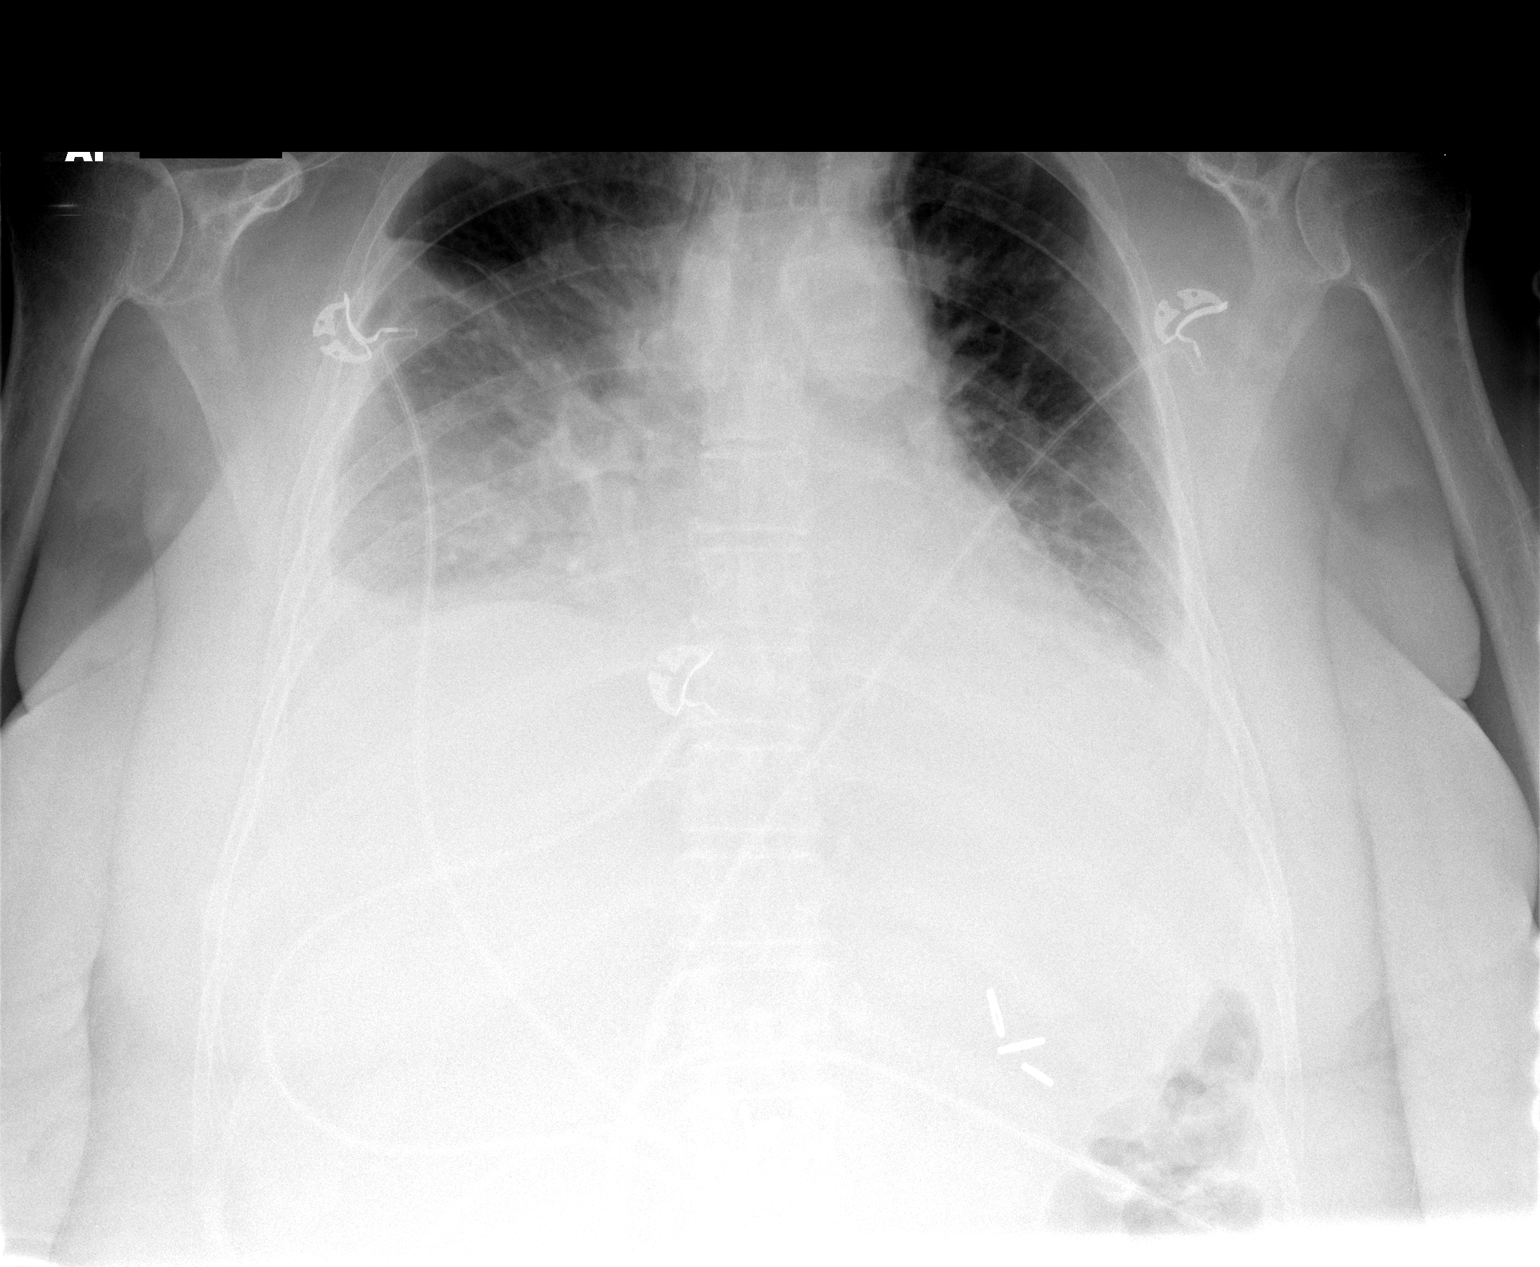

[2 of 2 positions shown; findings below may reference images not displayed]

FINDINGS: Stable cardiomegaly. No pneumothorax is noted. Stable bilateral
perihilar and basilar opacities are noted, with right worse than
left, consistent with pulmonary edema and associated mild bilateral
pleural effusions. Bony thorax is intact.
IMPRESSION: Stable bilateral pulmonary edema with associated pleural effusions.

## 2014-12-25 IMAGING — DX DG CHEST 1V PORT
1 series · 1 of 1 positions shown · non-contrast
Comparison: September 22, 2014.

CLINICAL DATA: Dyspnea.

EXAM:
PORTABLE CHEST - 1 VIEW

[chest ap]
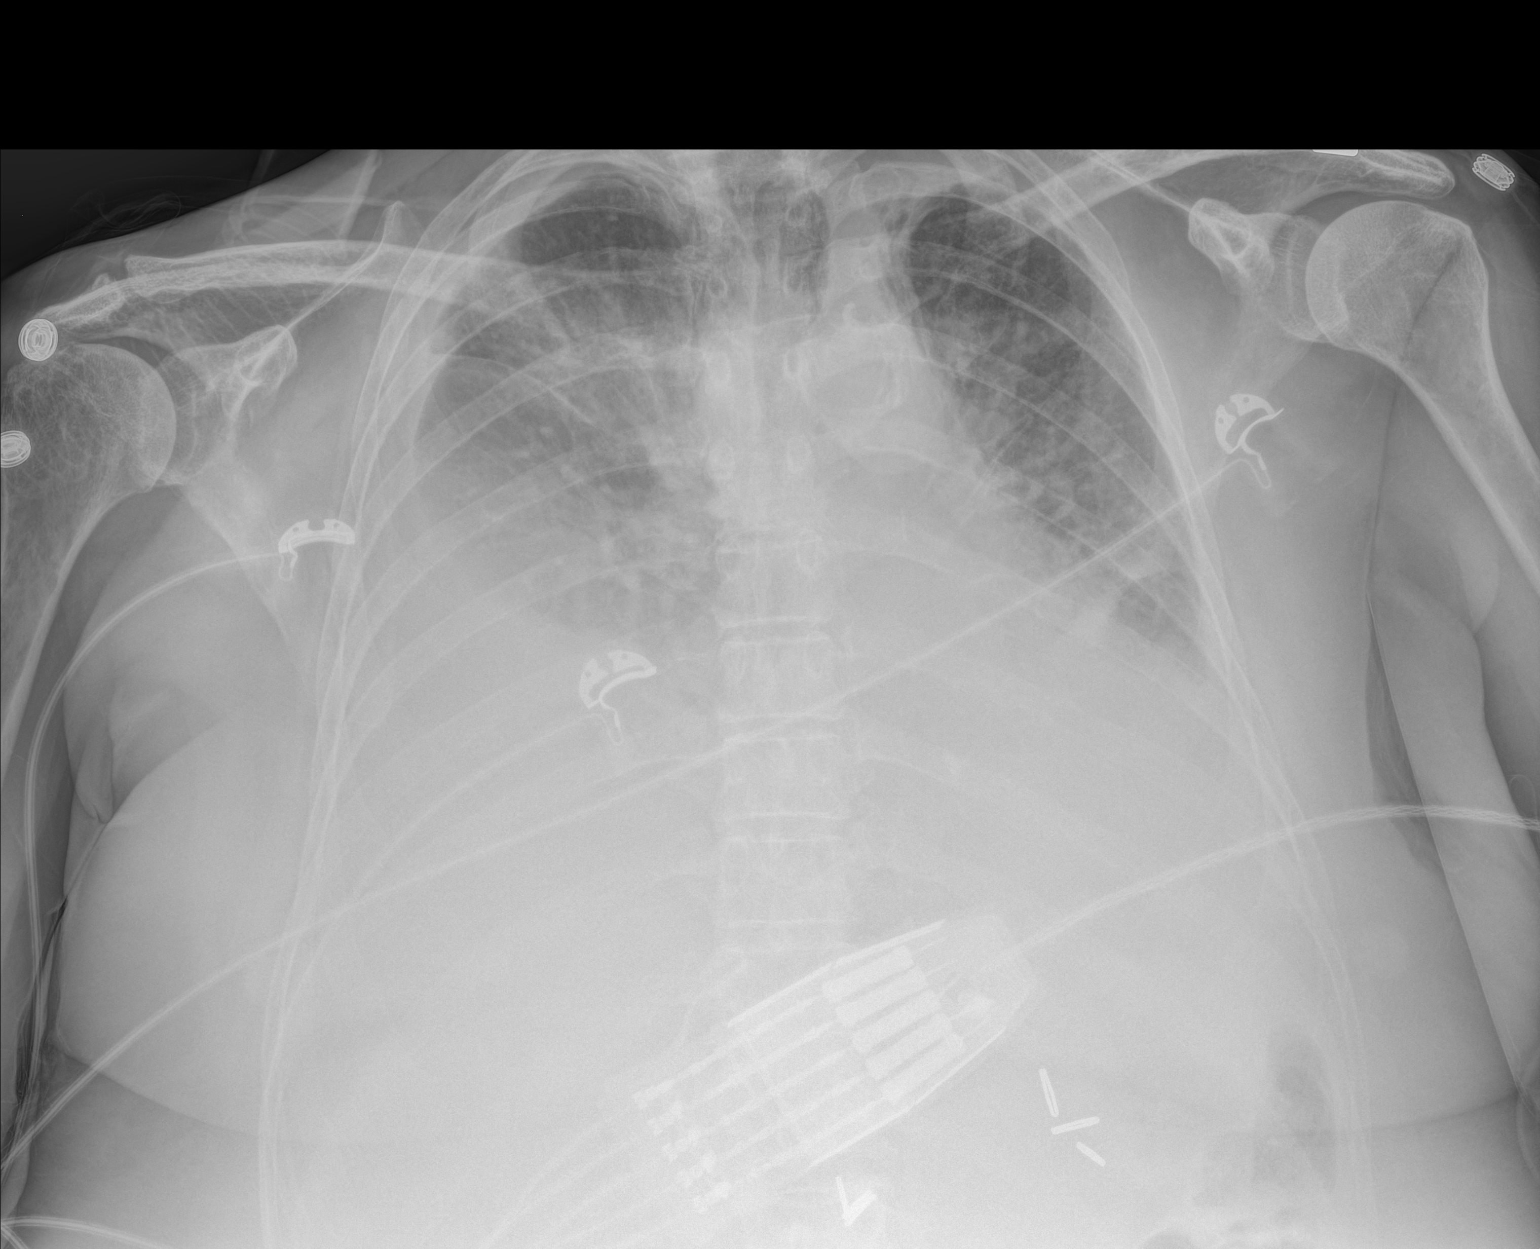

[1 of 1 positions shown; findings below may reference images not displayed]

FINDINGS: No pneumothorax is noted. Stable cardiomediastinal silhouette.
Increased central pulmonary vascular congestion is noted with
probable perihilar edema. Moderate to large right pleural effusion
is noted which is increased compared to prior exam. Stable mild to
moderate left pleural effusion is noted. Bony thorax is intact.
IMPRESSION: Increased central pulmonary vascular congestion and bilateral
pulmonary edema is noted with increased right pleural effusion.
Stable moderate left pleural effusion is noted.

## 2015-04-29 ENCOUNTER — Ambulatory Visit: Payer: BC Managed Care – PPO | Admitting: Pulmonary Disease
